# Patient Record
Sex: Male | Born: 1935 | Race: White | Hispanic: No | Marital: Married | Smoking: Former smoker
Health system: Southern US, Community
[De-identification: ages and names within clinical notes are randomized; demographics above are authoritative.]

## PROBLEM LIST (undated history)

## (undated) DIAGNOSIS — G4730 Sleep apnea, unspecified: Secondary | ICD-10-CM

## (undated) DIAGNOSIS — M109 Gout, unspecified: Secondary | ICD-10-CM

## (undated) DIAGNOSIS — I10 Essential (primary) hypertension: Secondary | ICD-10-CM

## (undated) DIAGNOSIS — E669 Obesity, unspecified: Secondary | ICD-10-CM

## (undated) DIAGNOSIS — K2270 Barrett's esophagus without dysplasia: Secondary | ICD-10-CM

## (undated) DIAGNOSIS — I2699 Other pulmonary embolism without acute cor pulmonale: Secondary | ICD-10-CM

## (undated) HISTORY — DX: Gout, unspecified: M10.9

## (undated) HISTORY — DX: Essential (primary) hypertension: I10

## (undated) HISTORY — PX: EYE SURGERY: SHX253

## (undated) HISTORY — DX: Barrett's esophagus without dysplasia: K22.70

## (undated) HISTORY — DX: Sleep apnea, unspecified: G47.30

---
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
## 2003-08-25 HISTORY — PX: HERNIA REPAIR: SHX51

## 2017-04-14 ENCOUNTER — Encounter: Payer: Self-pay | Admitting: *Deleted

## 2017-04-15 ENCOUNTER — Encounter: Payer: Self-pay | Admitting: Nurse Practitioner

## 2017-04-15 ENCOUNTER — Ambulatory Visit: Payer: Medicare Other | Admitting: Nurse Practitioner

## 2017-04-15 DIAGNOSIS — K432 Incisional hernia without obstruction or gangrene: Secondary | ICD-10-CM

## 2017-04-15 DIAGNOSIS — G4730 Sleep apnea, unspecified: Secondary | ICD-10-CM | POA: Diagnosis not present

## 2017-04-15 DIAGNOSIS — K2270 Barrett's esophagus without dysplasia: Secondary | ICD-10-CM | POA: Insufficient documentation

## 2017-04-15 DIAGNOSIS — R350 Frequency of micturition: Secondary | ICD-10-CM | POA: Diagnosis not present

## 2017-04-15 DIAGNOSIS — M1A0790 Idiopathic chronic gout, unspecified ankle and foot, without tophus (tophi): Secondary | ICD-10-CM | POA: Diagnosis not present

## 2017-04-15 DIAGNOSIS — M109 Gout, unspecified: Secondary | ICD-10-CM | POA: Insufficient documentation

## 2017-04-15 DIAGNOSIS — G43109 Migraine with aura, not intractable, without status migrainosus: Secondary | ICD-10-CM

## 2017-04-15 DIAGNOSIS — K22719 Barrett's esophagus with dysplasia, unspecified: Secondary | ICD-10-CM | POA: Diagnosis not present

## 2017-04-15 DIAGNOSIS — R609 Edema, unspecified: Secondary | ICD-10-CM

## 2017-04-15 DIAGNOSIS — I10 Essential (primary) hypertension: Secondary | ICD-10-CM | POA: Diagnosis not present

## 2017-04-15 NOTE — Assessment & Plan Note (Signed)
Ventral hernia, slightly left to the mid line of abdomen a half way from xyphoid process to umbilicus, size of a golf ball, reducible. Likely incisional since he is s/p GERD correction surgery and umbilical hernia repair. He stated he underwent a evaluation of surgeon. It does get bigger over the years. Continue to observe.

## 2017-04-15 NOTE — Assessment & Plan Note (Addendum)
Underwent GI evaluation and esophagoscopy, taking Omeprazole 20mg  daily, avoid food high in acidity, ice cream, or eating too late. His father died of esophageal cancer. Will need f/u GI in future. Last colonoscopy was 2018. Update CBC

## 2017-04-15 NOTE — Assessment & Plan Note (Signed)
First onset at age of 36-15, better over years, has visual aura, usually lasted about 20-30 minutes, taking Sumatriptan 50mg  qd

## 2017-04-15 NOTE — Assessment & Plan Note (Addendum)
Underwent sleep study, needs BiPAP, will assist the patient to obtain equipment.

## 2017-04-15 NOTE — Assessment & Plan Note (Signed)
Cannot remember right or left ankle, last gout attack was at least 20 year ago, taking Allopurinol 100mg  bid, will update Uric acid and CMP

## 2017-04-15 NOTE — Patient Instructions (Signed)
CBC CMP TSH Uric acid lipid panel prior to the next appointment. Next appointment: 6 months with Dr. Bubba Camp. Referral to Urology as a new patient.

## 2017-04-15 NOTE — Assessment & Plan Note (Addendum)
Blood pressure is controlled, taking Verapamil, the patient reported he has a large heart, not sure cardiomegaly vs cardiomyopathy, pending medical record transfer. Update TSH, lipid panel.

## 2017-04-15 NOTE — Assessment & Plan Note (Signed)
2-3x/night, he stated his PSA was okay in the past per his PCP in [facility]. Will refer to Urology for further evaluation.

## 2017-04-15 NOTE — Assessment & Plan Note (Signed)
Trace edema noted in ankles and feet, denied cough, SOB, nocturnal orthopnea. Hx of cardiomegaly, underwent cardiology evaluation, he was told his heart function is at minimal of normal function. Verapamil may be contributory. Observe.

## 2017-04-15 NOTE — Progress Notes (Addendum)
Provider:  Marlana Latus NP Location:   clinic [place]   Place of Service:   Clinic   PCP: Avory Rahimi X, NP Patient Care Team: Janine Reller X, NP as PCP - General (Internal Medicine)  Extended Emergency Contact Information Primary Emergency Contact: Dubie,Betty Address: [address], [place] [address] [place] Phone: [phone] Mobile Phone: [phone] Relation: Spouse  Code Status: DNR Goals of Care: Advanced Directive information Advanced Directives 04/15/2017  Does Patient Have a Medical Advance Directive? Yes  Type of Paramedic of [place];Living will  Does patient want to make changes to medical advance directive? No - Patient declined  Copy of [place] in Chart? Yes      Chief Complaint  Patient presents with  . Establish Care    HPI: Patient is a 82 y.o. male seen today for admission to [facility] @ [place].   The patient has history of GERD, s/p surgical correction, HTN, enlarged hart, gout, barrett's esophagitis, sleep apnea, migraine headache.[place] [facility]. His recent cognitive test BIM 15/15. He has a concern of urination 2-3x/night and some swelling in ankles/feet. He denied SOB, cough, palpitation, dysuria, or urinary urgency.  Past Medical History:  Diagnosis Date  . Apnea, sleep   . Barrett's esophagus   . Gout   . Hypertension    Past Surgical History:  Procedure Laterality Date  . EYE SURGERY Bilateral 2011-2012   catracts removed  . HERNIA REPAIR  2005   T. Carlton Adam MD  . TONSILECTOMY/ADENOIDECTOMY WITH MYRINGOTOMY  1944    reports that he has quit smoking. He has a 15.00 pack-year smoking history. He has never used smokeless tobacco. He reports that he drinks about 1.8 - 2.4 oz of alcohol per week . He reports that he does not use drugs. Social History   Social History  . Marital status: Married    Spouse name: Inez Catalina  . Number  of children: 1  . Years of education: N/A   Occupational History  . Not on file.   Social History Main Topics  . Smoking status: Former Smoker    Packs/day: 1.00    Years: 15.00  . Smokeless tobacco: Never Used  . Alcohol use 1.8 - 2.4 oz/week    3 - 4 Standard drinks or equivalent per week  . Drug use: No  . Sexual activity: Not on file   Other Topics Concern  . Not on file   Social History Narrative   Social History     Marital status: Married ,1986            Spouse name: Inez Catalina                    Years of education:                 Number of children: 1                Occupational History: Teacher     None on file      Social History Main Topics     Smoking status:                       Smokeless tobacco: Not on file  Alcohol use: 3-4 drinks per week     Drug use: Unknown     Sexual activity: Not on file            Other Topics            Concern     None on file      Social History Narrative     None on file                Functional Status Survey:    Family History  Problem Relation Age of Onset  . Osteoporosis Mother   . Cancer Father 32       esophagus  . Crohn's disease Sister   . Birth defects Maternal Grandmother        colon  . Diabetes Paternal Grandmother     Health Maintenance  Topic Date Due  . PNA vac Low Risk Adult (2 of 2 - PPSV23) 09/13/2015  . INFLUENZA VACCINE  03/24/2017  . TETANUS/TDAP  07/31/2026    Allergies  Allergen Reactions  . Penicillins     Allergies as of 04/15/2017      Reactions   Penicillins       Medication List       Accurate as of 04/15/17 11:59 PM. Always use your most recent med list.          allopurinol 100 MG tablet Commonly known as:  ZYLOPRIM Take 100 mg by mouth 2 (two) times daily.   fluticasone 50 MCG/ACT nasal spray Commonly known as:  FLONASE Place into both nostrils daily.   multivitamin with minerals tablet Take 1 tablet by mouth daily.   omeprazole 20  MG capsule Commonly known as:  PRILOSEC Take 20 mg by mouth daily.   SUMAtriptan 50 MG tablet Commonly known as:  IMITREX Take 50 mg by mouth as needed for migraine. May repeat in 2 hours if headache persists or recurs.   verapamil 180 MG CR tablet Commonly known as:  CALAN-SR Take 180 mg by mouth at bedtime.            Discharge Care Instructions        Start     Ordered   04/16/17 0000  CBC (no diff)     04/16/17 0956   04/16/17 0000  CMP and Liver     04/16/17 0956   04/16/17 0000  Uric Acid     04/16/17 0956   04/16/17 0000  Lipid panel     04/16/17 0956   04/16/17 0000  TSH     04/16/17 0956      Review of Systems  Constitutional: Negative for activity change, appetite change, chills, diaphoresis, fatigue, fever and unexpected weight change.       Overweight  HENT: Positive for hearing loss. Negative for congestion, dental problem, rhinorrhea, sore throat, tinnitus, trouble swallowing and voice change.   Eyes: Negative for pain, redness and itching.       S/p cataracts removal and lens implants R+L  Respiratory: Negative for cough, choking, chest tightness and shortness of breath.        Sleep apnea  Cardiovascular: Positive for leg swelling. Negative for chest pain and palpitations.  Gastrointestinal: Negative for abdominal distention, abdominal pain, constipation, diarrhea, nausea and vomiting.       Heart burns with certain food or eating too close to bedtime  Endocrine: Negative for cold intolerance and heat intolerance.  Genitourinary: Positive for frequency. Negative for difficulty urinating, dysuria and  urgency.       2-3x/night. Circumcised  Musculoskeletal: Negative for arthralgias, back pain, gait problem, joint swelling, myalgias, neck pain and neck stiffness.  Skin: Negative for pallor, rash and wound.  Neurological: Positive for headaches. Negative for dizziness, tremors, seizures, speech difficulty and numbness.  Hematological: Negative for  adenopathy.  Psychiatric/Behavioral: Negative for agitation, behavioral problems, confusion, decreased concentration, hallucinations and sleep disturbance. The patient is not nervous/anxious.        BIM 15/15    Vitals:   04/15/17 1426  BP: 118/60  Pulse: (!) 58  Resp: 20  Temp: (!) 97.4 F (36.3 C)  SpO2: 96%  Weight: 242 lb (109.8 kg)  Height: 5\' 11"  (1.803 m)   Body mass index is 33.75 kg/m. Physical Exam  Constitutional: He is oriented to person, place, and time. He appears well-developed and well-nourished. No distress.  HENT:  Head: Normocephalic and atraumatic.  Right Ear: External ear normal.  Nose: Nose normal.  Mouth/Throat: Oropharynx is clear and moist. No oropharyngeal exudate.  Eyes: Pupils are equal, round, and reactive to light. Conjunctivae and EOM are normal. Right eye exhibits no discharge. Left eye exhibits no discharge. No scleral icterus.  Neck: Normal range of motion. Neck supple.  Cardiovascular: Normal rate, regular rhythm, normal heart sounds and intact distal pulses.   No murmur heard. Pulmonary/Chest: Effort normal and breath sounds normal. He has no rales.  Abdominal: Soft. Bowel sounds are normal. He exhibits no distension. There is no tenderness. There is no rebound and no guarding.  Ventral hernia, slightly left to the mid line of abdomen a half way from xyphoid process to umbilicus, size of a golf ball, reducible  Musculoskeletal: Normal range of motion. He exhibits edema.  Trace edema ankles and feet.   Lymphadenopathy:    He has no cervical adenopathy.  Neurological: He is alert and oriented to person, place, and time. No cranial nerve deficit. He exhibits normal muscle tone. Coordination normal.  Skin: Skin is warm and dry. No rash noted. He is not diaphoretic. No erythema. No pallor.  Superficial varicose veins R+L lower legs. Mid abd surgical scar from GERD corrective surgery   Psychiatric: He has a normal mood and affect. His behavior is  normal. Judgment and thought content normal.    Labs reviewed: Basic Metabolic Panel: No results for input(s): NA, K, CL, CO2, GLUCOSE, BUN, CREATININE, CALCIUM, MG, PHOS in the last 8760 hours. Liver Function Tests: No results for input(s): AST, ALT, ALKPHOS, BILITOT, PROT, ALBUMIN in the last 8760 hours. No results for input(s): LIPASE, AMYLASE in the last 8760 hours. No results for input(s): AMMONIA in the last 8760 hours. CBC: No results for input(s): WBC, NEUTROABS, HGB, HCT, MCV, PLT in the last 8760 hours. Cardiac Enzymes: No results for input(s): CKTOTAL, CKMB, CKMBINDEX, TROPONINI in the last 8760 hours. BNP: Invalid input(s): POCBNP No results found for: HGBA1C No results found for: TSH No results found for: VITAMINB12 No results found for: FOLATE No results found for: IRON, TIBC, FERRITIN  Imaging and Procedures obtained prior to SNF admission: Patient was never admitted.  Assessment/Plan  Ventral hernia, recurrent Ventral hernia, slightly left to the mid line of abdomen a half way from xyphoid process to umbilicus, size of a golf ball, reducible. Likely incisional since he is s/p GERD correction surgery and umbilical hernia repair. He stated he underwent a evaluation of surgeon. It does get bigger over the years. Continue to observe.   Edema Trace edema noted in ankles  and feet, denied cough, SOB, nocturnal orthopnea. Hx of cardiomegaly, underwent cardiology evaluation, he was told his heart function is at minimal of normal function. Verapamil may be contributory. Observe.  Gout of ankle Cannot remember right or left ankle, last gout attack was at least 20 year ago, taking Allopurinol 100mg  bid, will update Uric acid and CMP   Hypertension Blood pressure is controlled, taking Verapamil, the patient reported he has a large heart, not sure cardiomegaly vs cardiomyopathy, pending medical record transfer. Update TSH, lipid panel.   Barrett's esophagus Underwent GI  evaluation and esophagoscopy, taking Omeprazole 20mg  daily, avoid food high in acidity, ice cream, or eating too late. His father died of esophageal cancer. Will need f/u GI in future. Last colonoscopy was 2018. Update CBC  Migraine headache with aura First onset at age of 89-15, better over years, has visual aura, usually lasted about 20-30 minutes, taking Sumatriptan 50mg  qd   Urinary frequency 2-3x/night, he stated his PSA was okay in the past per his PCP in [facility]. Will refer to Urology for further evaluation.   Sleep apnea in adult Underwent sleep study, needs BiPAP, will assist the patient to obtain equipment.   Family/ staff Communication: plan of care reviewed with the patient, needs Urology evaluation, f/u GI for Barrett esophagitis  Labs/tests ordered: CBC CMP TSH Uric acid lipid panel prior to the next appointment  Next appointment: 6 months with Dr. Bubba Camp

## 2017-04-21 ENCOUNTER — Telehealth: Payer: Self-pay | Admitting: *Deleted

## 2017-04-21 NOTE — Telephone Encounter (Signed)
Spoke with patient regarding his BiPap machine, I informed him that I had spoken with Bethanne Ginger  And she stated that it would probably be at the beginning of next week due to the holiday on Monday before they will get out.to see him.

## 2017-05-05 ENCOUNTER — Telehealth: Payer: Self-pay | Admitting: *Deleted

## 2017-05-05 NOTE — Telephone Encounter (Signed)
Patient would like ManXie to refer him to a Sleep Specialist. Has some concerns with his CPAP. Would like for [facility] Medical Assistant to call him. Would like to see [facility]. Please Call [phone]

## 2017-05-05 NOTE — Telephone Encounter (Signed)
Spoke with Mr. Eric Chung regarding a referral to a sleep specialist , preferably Dr. Danielle Rankin @ [place]. Mr. Eric Chung stated that he has tubes running from his tear ducts in the corners of his eyes and he is having problems with his BiPap machine forcing air into these tubes and comes out of his eyes. He is requesting an appointment because he is unable to sleep due to this issue. I left an message on the phone [name] to return my call on the clinic phone [phone]).

## 2017-05-12 ENCOUNTER — Telehealth: Payer: Self-pay | Admitting: *Deleted

## 2017-05-12 ENCOUNTER — Other Ambulatory Visit: Payer: Self-pay | Admitting: *Deleted

## 2017-05-12 DIAGNOSIS — G4730 Sleep apnea, unspecified: Secondary | ICD-10-CM

## 2017-05-12 NOTE — Telephone Encounter (Signed)
Called resident regarding his sleep study , wanted to inform him that I spoke with Ms [name] about his referral for his study and if he could call her so she can get him scheduled.

## 2017-05-18 ENCOUNTER — Non-Acute Institutional Stay: Payer: Medicare Other | Admitting: Internal Medicine

## 2017-05-18 ENCOUNTER — Encounter: Payer: Self-pay | Admitting: Internal Medicine

## 2017-05-18 VITALS — BP 116/64 | HR 66 | Temp 98.3°F | Resp 18 | Ht 71.0 in | Wt 240.4 lb

## 2017-05-18 DIAGNOSIS — H04121 Dry eye syndrome of right lacrimal gland: Secondary | ICD-10-CM | POA: Diagnosis not present

## 2017-05-18 DIAGNOSIS — G4730 Sleep apnea, unspecified: Secondary | ICD-10-CM | POA: Diagnosis not present

## 2017-05-18 DIAGNOSIS — E669 Obesity, unspecified: Secondary | ICD-10-CM

## 2017-05-18 NOTE — Progress Notes (Signed)
[facility]  Provider: Blanchie Serve MD   Location:  [place] of Service:  Clinic (12)  PCP: Mast, Man X, NP Patient Care Team: Mast, Man X, NP as PCP - General (Internal Medicine)  Extended Emergency Contact Information Primary Emergency Contact: Stiner,Betty Address: [address], [address], [place] [address] [place] Phone: [phone] Mobile Phone: [phone] Relation: Spouse   Goals of Care: Advanced Directive information Advanced Directives 04/15/2017  Does Patient Have a Medical Advance Directive? Yes  Type of Paramedic of [place];Living will  Does patient want to make changes to medical advance directive? No - Patient declined  Copy of [place] in Chart? Yes     Chief Complaint  Patient presents with  . Acute Visit    The bipap machine is causing the air to go up into eyes and its drying them out. Patient stated that he had tubes in both eyes. Its only the right eye thats causing issus.   . Medication Refill    No refills needed at this time. Patient stated that he's having issues sleeping and would like to try a sleep aid.  . Other    Patient stated that the bipap machine is on a high setting and wants to know can it be adjusted. He stated that he has an appointment on October 16 with the sleep study people.    HPI: Patient is a 82 y.o. Chung seen today for acute visit. He has been diagnosed with severe sleep apnea June 2018. He now has a BiPAP machine for 2 weeks. He has been having trouble with his right eye since using the machine. He feels air blowing from inside his nose to medial side of his right eye and this is making his eye dry and also interrupting with his sleep. He has some itching and crusting to right eye in am and as day progresses, these symptoms have resolved. He has history of lacrimal duct blockage and had a plastic duct  placed about 15 years back.   Past Medical History:  Diagnosis Date  . Apnea, sleep   . Barrett's esophagus   . Gout   . Hypertension    Past Surgical History:  Procedure Laterality Date  . EYE SURGERY Bilateral 2011-2012   catracts removed  . HERNIA REPAIR  2005   T. Carlton Adam MD  . TONSILECTOMY/ADENOIDECTOMY WITH MYRINGOTOMY  1944    reports that he has quit smoking. He has a 15.00 pack-year smoking history. He has never used smokeless tobacco. He reports that he drinks about 1.8 - 2.4 oz of alcohol per week . He reports that he does not use drugs. Social History   Social History  . Marital status: Married    Spouse name: Inez Catalina  . Number of children: 1  . Years of education: N/A   Occupational History  . Not on file.   Social History Main Topics  . Smoking status: Former Smoker    Packs/day: 1.00    Years: 15.00  . Smokeless tobacco: Never Used  . Alcohol use 1.8 - 2.4 oz/week    3 - 4 Standard drinks or equivalent per week  . Drug use: No  . Sexual activity: Not on file   Other Topics Concern  . Not on file   Social History Narrative   Social History  Marital status: Married ,1986            Spouse name: Inez Catalina                    Years of education:                 Number of children: 1                Occupational History: Pharmacist, hospital     None on file      Social History Main Topics     Smoking status:                       Smokeless tobacco: Not on file                        Alcohol use: 3-4 drinks per week     Drug use: Unknown     Sexual activity: Not on file            Other Topics            Concern     None on file      Social History Narrative     None on file                 Family History  Problem Relation Age of Onset  . Osteoporosis Mother   . Cancer Father 20       esophagus  . Crohn's disease Sister   . Birth defects Maternal Grandmother        colon  . Diabetes Paternal Grandmother     Health Maintenance  Topic Date Due  .  PNA vac Low Risk Adult (2 of 2 - PPSV23) 09/13/2015  . INFLUENZA VACCINE  03/24/2017  . TETANUS/TDAP  07/31/2026    Allergies  Allergen Reactions  . Penicillins     Outpatient Encounter Prescriptions as of 05/18/2017  Medication Sig  . allopurinol (ZYLOPRIM) 100 MG tablet Take 100 mg by mouth 2 (two) times daily.  . fluticasone (FLONASE) 50 MCG/ACT nasal spray Place 1 spray into both nostrils daily.   . Multiple Vitamins-Minerals (MULTIVITAMIN WITH MINERALS) tablet Take 1 tablet by mouth daily.  Marland Kitchen omeprazole (PRILOSEC) 20 MG capsule Take 20 mg by mouth daily.  . SUMAtriptan (IMITREX) 50 MG tablet Take 50 mg by mouth as needed for migraine. May repeat in 2 hours if headache persists or recurs.  . verapamil (CALAN-SR) 180 MG CR tablet Take 180 mg by mouth at bedtime.   No facility-administered encounter medications on file as of 05/18/2017.     Review of Systems  HENT: Negative for congestion, ear pain, mouth sores, postnasal drip, rhinorrhea, sinus pain, sinus pressure, sore throat and trouble swallowing.   Respiratory: Negative for cough and shortness of breath.   Cardiovascular: Negative for chest pain.  Neurological: Negative for light-headedness and headaches.    Vitals:   05/18/17 1052  BP: 116/64  Pulse: 66  Resp: 18  Temp: 98.3 F (36.8 C)  TempSrc: Oral  SpO2: 93%  Weight: 240 lb 6.4 oz (109 kg)  Height: 5\' 11"  (1.803 m)   Body mass index is 33.53 kg/m. Physical Exam  Constitutional: He is oriented to person, place, and time.  Obese elderly Chung in no distress.   HENT:  Head: Normocephalic and atraumatic.  Mouth/Throat: Oropharynx is clear and moist.  Eyes: Pupils are equal, round, and reactive to  light. Conjunctivae and EOM are normal. Right eye exhibits no discharge. Left eye exhibits no discharge.  Neck: Neck supple.  Cardiovascular: Normal rate and regular rhythm.   Pulmonary/Chest: Effort normal.  Decreased air entry to lung bases with rales.     Abdominal: Soft. Bowel sounds are normal.  Musculoskeletal: Normal range of motion.  Neurological: He is alert and oriented to person, place, and time.  Skin: Skin is warm and dry.  Psychiatric: He has a normal mood and affect.    Labs reviewed: Basic Metabolic Panel: No results for input(s): NA, K, CL, CO2, GLUCOSE, BUN, CREATININE, CALCIUM, MG, PHOS in the last 8760 hours. Liver Function Tests: No results for input(s): AST, ALT, ALKPHOS, BILITOT, PROT, ALBUMIN in the last 8760 hours. No results for input(s): LIPASE, AMYLASE in the last 8760 hours. No results for input(s): AMMONIA in the last 8760 hours. CBC: No results for input(s): WBC, NEUTROABS, HGB, HCT, MCV, PLT in the last 8760 hours. Cardiac Enzymes: No results for input(s): CKTOTAL, CKMB, CKMBINDEX, TROPONINI in the last 8760 hours. BNP: Invalid input(s): POCBNP No results found for: HGBA1C No results found for: TSH No results found for: VITAMINB12 No results found for: FOLATE No results found for: IRON, TIBC, FERRITIN  Lipid Panel: No results for input(s): CHOL, HDL, LDLCALC, TRIG, CHOLHDL, LDLDIRECT in the last 8760 hours. No results found for: HGBA1C  Procedures since last visit: No results found.  Assessment/Plan   1. Sleep apnea, unspecified type Currently on BiPAP. Will need to be seen by sleep study team to check on his mask fitting and need for change of setting. patient does not want to use it until further evaluation in sleep clinic. Encouraged him to use the machine with supportive therapy to right eye as below. Patient agrees.   2. Obesity (BMI 30-39.9) Exercise and diet counselling provided. Advised to use his biPAP to help him rest better through night and make him feel rested in am so he has energy to stay active and possibly exercise. Weight loss encouraged.   3. Dry eye of right side With air blowing to his right eye- this could be from leakage from his mask or from problem of his lacrimal duct.  Advised artifical tear drops to keep eye moist and to moisten clean cloth piece and apply it to his right eye before going to bed at night. Advised to see ophthalmology for further assessment. Also advised to notify if has pain to right eye or change of vision. Pt agrees.     Labs/tests ordered:  none  Next appointment: has f/u in 2/18 for RV  Communication: reviewed care plan with patient   Blanchie Serve, MD Internal Medicine [place], [place] [address] Cell Phone (Monday-Friday 8 am - 5 pm): [phone] On Call: [phone] and follow prompts after 5 pm and on weekends Office Phone: [phone] Office Fax: [phone]

## 2017-05-20 ENCOUNTER — Telehealth: Payer: Self-pay | Admitting: Nurse Practitioner

## 2017-05-20 NOTE — Telephone Encounter (Signed)
Left message asking patient to call and schedule AWV-S with nurse at [facility] clinic on Thursday, 05/27/17. VDM (DD)

## 2017-05-24 ENCOUNTER — Telehealth: Payer: Self-pay

## 2017-05-24 ENCOUNTER — Ambulatory Visit (INDEPENDENT_AMBULATORY_CARE_PROVIDER_SITE_OTHER): Payer: Medicare Other | Admitting: Neurology

## 2017-05-24 ENCOUNTER — Encounter: Payer: Self-pay | Admitting: Neurology

## 2017-05-24 VITALS — BP 136/82 | HR 74 | Ht 71.0 in | Wt 242.0 lb

## 2017-05-24 DIAGNOSIS — G4733 Obstructive sleep apnea (adult) (pediatric): Secondary | ICD-10-CM | POA: Diagnosis not present

## 2017-05-24 DIAGNOSIS — Z789 Other specified health status: Secondary | ICD-10-CM

## 2017-05-24 NOTE — Progress Notes (Signed)
Subjective:    Patient ID: Eric Chung is a 82 y.o. male.  HPI     Star Age, MD, PhD [facility] [address] [place], [place] [address]  Dear Man,   I saw your patient, Eric Chung, upon your kind request in my neurologic clinic today for initial consultation of his sleep disorder, in particular reevaluation of his OSA. The patient is unaccompanied today. As you know, Eric Chung is an 82 year old right-handed gentleman with an underlying medical history of gout, reflux disease with Barrett's esophagus, hypertension, and obesity, who was recently diagnosed with obstructive sleep apnea and placed on BiPAP ST. Prior sleep study results were reviewed today. He had a split-night sleep study on 02/18/2017 at a sleep lab facility in [place]. Baseline sleep study results indicated an AHI of 61.2 per hour. He had no significant PLMS. CPAP was titrated from 5 cm to 8 cm and he was switched to BiPAP of 19/15 due to central events noted. He was placed on a backup rate due to central apneas and hypopneas persisting. Optimal pressure was determined to be 17-19 cm over 15-16 cm. He was fitted with a air touch fullface mask. I reviewed your office note from 04/15/2017. A BiPAP download was reviewed today, 05/03/2017 through 05/23/2017, which is a total of 21 days, during which time he used his BiPAP every night with percent used days greater than 4 hours at 86%, indicating very good compliance with an average usage of 5 hours and 9 minutes, residual AHI 8.6 per hour, leak very high with the 95th percentile at 100 L/m on a pressure of 18/14 with a rate of 12. He reports difficulty tolerating the treatment. He has a history of eye problems. He has been seeing an eye doctor, he has been seeing a specialist for dry eyes, he has been given the lubricating ointment to put in his eyes and also was advised to start using goggles at night to prevent the BiPAP air to  go into his right eye. He is using a simplus fullface mask size medium. He has 2 other kinds of masks at the house. He needs to use a fullface mask but also has a full beard. He feels like the mask rides up and he has leakage from the mouth. He has increased his ramp time to 45 minutes to adjust better. He has been on treatment for 3 weeks and is not necessarily feeling any better yet. His wife reports that his snoring is much better and the machine is quiet enough for her to be able to sleep. They moved from [place] to friend's home independent living. He is a retired Network engineer. He quit smoking in 1987, drinks alcohol 2-3 times per week, drinks tea typically in the morning, typically no coffee or sodas. Of note, he has gained weight since his move in the past 6 months, in the realm of 20 pounds he estimates. He denies restless leg symptoms or morning headaches. Epworth sleepiness score is 9 out of 24, fatigue score is 41 out of 63. He goes to bed around 10:30 to 11 PM, WT around 7:30.  Prior to his attendant split-night sleep study he had a home sleep test on 01/05/2017 which indicated an AHI of about 46 per hour. He has some difficulty falling asleep and staying asleep, nocturia about 1-2 per night.   His Past Medical History Is Significant For: Past Medical History:  Diagnosis Date  . Apnea, sleep   .  Barrett's esophagus   . Gout   . Hypertension     His Past Surgical History Is Significant For: Past Surgical History:  Procedure Laterality Date  . EYE SURGERY Bilateral 2011-2012   catracts removed  . HERNIA REPAIR  2005   T. Carlton Adam MD  . TONSILECTOMY/ADENOIDECTOMY WITH MYRINGOTOMY  1944    His Family History Is Significant For: Family History  Problem Relation Age of Onset  . Osteoporosis Mother   . Cancer Father 46       esophagus  . Crohn's disease Sister   . Birth defects Maternal Grandmother        colon  . Diabetes Paternal Grandmother     His Social History Is Significant  For: Social History   Social History  . Marital status: Married    Spouse name: Inez Catalina  . Number of children: 1  . Years of education: N/A   Social History Main Topics  . Smoking status: Former Smoker    Packs/day: 1.00    Years: 15.00  . Smokeless tobacco: Never Used  . Alcohol use 1.8 - 2.4 oz/week    3 - 4 Standard drinks or equivalent per week  . Drug use: No  . Sexual activity: Not Asked   Other Topics Concern  . None   Social History Narrative   Social History     Marital status: Married ,1986            Spouse name: Inez Catalina                    Years of education:                 Number of children: 1                Occupational History: Pharmacist, hospital     None on file      Social History Main Topics     Smoking status:                       Smokeless tobacco: Not on file                        Alcohol use: 3-4 drinks per week     Drug use: Unknown     Sexual activity: Not on file            Other Topics            Concern     None on file      Social History Narrative     None on file                His Allergies Are:  Allergies  Allergen Reactions  . Penicillins   :   His Current Medications Are:  Outpatient Encounter Prescriptions as of 80/08/2016  Medication Sig  . allopurinol (ZYLOPRIM) 100 MG tablet Take 100 mg by mouth 2 (two) times daily.  . fluticasone (FLONASE) 50 MCG/ACT nasal spray Place 1 spray into both nostrils daily.   . Multiple Vitamins-Minerals (MULTIVITAMIN WITH MINERALS) tablet Take 1 tablet by mouth daily.  Marland Kitchen omeprazole (PRILOSEC) 20 MG capsule Take 20 mg by mouth daily.  . SUMAtriptan (IMITREX) 50 MG tablet Take 50 mg by mouth as needed for migraine. May repeat in 2 hours if headache persists or recurs.  . verapamil (CALAN-SR) 180 MG CR tablet Take 180 mg by mouth at bedtime.  No facility-administered encounter medications on file as of 80/08/2016.   :  Review of Systems:  Out of a complete 14 point review of systems, all are  reviewed and negative with the exception of these symptoms as listed below:  Review of Systems  Neurological:       Pt presents today to discuss his bipap. Pt was diagnosed with sleep apnea in [place]. Pt moved up here before he could receive treatment and [facility] has now set him up on bipap. Pt has had trouble adjusting to bipap and has only been on the bipap for 21 days.  Epworth Sleepiness Scale 0= would never doze 1= slight chance of dozing 2= moderate chance of dozing 3= high chance of dozing  Sitting and reading: 2 Watching TV: 1 Sitting inactive in a public place (ex. Theater or meeting): 0 As a passenger in a car for an hour without a break: 1 Lying down to rest in the afternoon: 3 Sitting and talking to someone: 0 Sitting quietly after lunch (no alcohol): 2 In a car, while stopped in traffic: 0 Total: 9     Objective:  Neurological Exam  Physical Exam Physical Examination:   Vitals:   05/24/17 1543  BP: 136/82  Pulse: 74   General Examination: The patient is a very pleasant 82 y.o. male in no acute distress. He appears well-developed and well-nourished and well groomed. Full bears, trimmed shorter.   HEENT: Normocephalic, atraumatic, pupils are equal, round and reactive to light and accommodation. Funduscopic exam is normal with sharp disc margins noted. Extraocular tracking is good without limitation to gaze excursion or nystagmus noted. Normal smooth pursuit is noted. Hearing is grossly intact. Tympanic membranes are clear bilaterally. Face is symmetric with normal facial animation and normal facial sensation. Speech is clear with no dysarthria noted. There is no hypophonia. There is no lip, neck/head, jaw or voice tremor. Neck is supple with full range of passive and active motion. There are no carotid bruits on auscultation. Oropharynx exam reveals: mild mouth dryness, adequate dental hygiene and moderate airway crowding, due to Redundant soft palate and large uvula.  Tonsils are absent. Mallampati is class II. Neck circumference is 17-1/4 inches.  Chest: Clear to auscultation without wheezing, rhonchi or crackles noted.  Heart: S1+S2+0, regular and normal without murmurs, rubs or gallops noted.   Abdomen: Soft, non-tender and non-distended with normal bowel sounds appreciated on auscultation.  Extremities: There is trace pitting edema in the distal lower extremities bilaterally. Pedal pulses are intact.  Skin: Warm and dry without trophic changes noted.  Musculoskeletal: exam reveals no obvious joint deformities, tenderness or joint swelling or erythema.   Neurologically:  Mental status: The patient is awake, alert and oriented in all 4 spheres. His immediate and remote memory, attention, language skills and fund of knowledge are appropriate. There is no evidence of aphasia, agnosia, apraxia or anomia. Speech is clear with normal prosody and enunciation. Thought process is linear. Mood is normal and affect is normal.  Cranial nerves II - XII are as described above under HEENT exam. In addition: shoulder shrug is normal with equal shoulder height noted. Motor exam: Normal bulk, strength and tone is noted. There is no drift, tremor or rebound. Fine motor skills and coordination: grossly intact.  Cerebellar testing: No dysmetria or intention tremor. Heel to shin is unremarkable bilaterally. There is no truncal or gait ataxia.  Sensory exam: intact to light touch in the upper and lower extremities.  Gait, station and balance:  He stands easily. No veering to one side is noted. No leaning to one side is noted. Posture is age-appropriate and stance is narrow based. Gait shows normal stride length and normal pace. No problems turning are noted.  Assessment and Plan:  In summary, Eric Chung is a very pleasant 82 y.o.-year old male with an underlying medical history of gout, reflux disease with Barrett's esophagus, hypertension, and obesity, who presents for  neurologic consultation of his sleep disorder, recent diagnosis of severe obstructive sleep apnea with a split-night sleep study done in [place] on 02/18/2017. He was titrated on CPAP and then on BiPAP ST. He is now on BiPAP ST, fully compliant with treatment but has not had any telltale improvement in his sleep, is struggling with high leak and also with dry eyes, sleep interruption, and does not feel fully rested. He is commended for his treatment adherence. He is encouraged to continue with it. I suggested he try melatonin at night for sleep, in the hope that it may help him consolidate his sleep a little better. We will also have our sleep lab manager, Shirlean Mylar, reach out to him to invite him for a daytime appointment for a mask refit and troubleshooting high leak. Ultimately, he may benefit from a titration study to optimize treatment settings and treatment level. His residual AHI is suboptimal around 8 per hour at this time and leak is rather high consistently, most likely secondary to air leaking from the mouth. I suggested a three-month recheck, sooner as needed. We will also have him have a mask refit appointment in the interim. I answered all his questions today and he was in agreement with the plan.  Thank you very much for allowing me to participate in the care of this nice patient. If I can be of any further assistance to you please do not hesitate to call me at [phone].  Sincerely,   Star Age, MD, PhD

## 2017-05-24 NOTE — Patient Instructions (Addendum)
Please continue using your BiPAP regularly. While your insurance requires that you use PAP at least 4 hours each night on 70% of the nights, I recommend, that you not skip any nights and use it throughout the night if you can. Getting used to PAP and staying with the treatment long term does take time and patience and discipline. Untreated obstructive sleep apnea when it is moderate to severe can have an adverse impact on cardiovascular health and raise her risk for heart disease, arrhythmias, hypertension, congestive heart failure, stroke and diabetes. Untreated obstructive sleep apnea causes sleep disruption, nonrestorative sleep, and sleep deprivation. This can have an impact on your day to day functioning and cause daytime sleepiness and impairment of cognitive function, memory loss, mood disturbance, and problems focussing. Using PAP regularly can improve these symptoms.  We will have you come back for a day time appointment in the sleep lab to work with Shirlean Mylar, our sleep lab manager for a mask fitting and to see if we can get you to tolerate your treatment.   Ultimately, we may have to bring you back for another sleep study to help optimize your treatment settings.   You can try Melatonin at night for sleep: take 1 mg to 3 mg, one to 2 hours before your bedtime. You can go up to 5 mg if needed. It is over the counter and comes in pill form, chewable form and spray, if you prefer.

## 2017-05-24 NOTE — Telephone Encounter (Signed)
Dr. Rexene Alberts is asking for this pt to meet with Robin to discuss a bipap mask refit. Pt uses [facility] as his DME. Pt knows that Shirlean Mylar will call him to schedule.

## 2017-05-26 NOTE — Telephone Encounter (Signed)
Called to remind pt of appt tomorrow. No answer and no voicemail option. VDM (DD)

## 2017-05-26 NOTE — Telephone Encounter (Signed)
Noted, thanks!

## 2017-05-26 NOTE — Telephone Encounter (Signed)
Patient came for mask fitting. He is using F&P full face mask medium. Mask is too small. When he opens his mouth it rises up and causes a leak. I fitted him with a large and he liked this much better. I gave him one to take home and try. I also fitted him with Res Med F10 large. If he needs to come back I will give him this one to try. I showed him how to adjust his humidity on his machine. He was having dryness in his mouth.

## 2017-05-27 ENCOUNTER — Non-Acute Institutional Stay: Payer: Medicare Other

## 2017-05-27 VITALS — BP 130/70 | HR 70 | Temp 98.2°F | Ht 71.0 in | Wt 242.0 lb

## 2017-05-27 DIAGNOSIS — Z0000 Encounter for general adult medical examination without abnormal findings: Secondary | ICD-10-CM

## 2017-05-27 MED ORDER — ZOSTER VAC RECOMB ADJUVANTED 50 MCG/0.5ML IM SUSR: 0.5000 mL | Freq: Once | INTRAMUSCULAR | 1 refills | Status: AC

## 2017-05-27 NOTE — Progress Notes (Signed)
Subjective:   Eric Chung is a 82 y.o. male who presents for Medicare Annual/Subsequent preventive examination at [facility]  Last AWV-03/2010       Objective:    Vitals: BP 130/70 (BP Location: Right Arm, Patient Position: Sitting)   Pulse 70   Temp 98.2 F (36.8 C) (Oral)   Ht 5\' 11"  (1.803 m)   Wt 242 lb (109.8 kg)   SpO2 93%   BMI 33.75 kg/m   Body mass index is 33.75 kg/m.  Tobacco History  Smoking Status  . Former Smoker  . Packs/day: 1.00  . Years: 15.00  Smokeless Tobacco  . Never Used     Counseling given: Not Answered   Past Medical History:  Diagnosis Date  . Apnea, sleep   . Barrett's esophagus   . Gout   . Hypertension    Past Surgical History:  Procedure Laterality Date  . EYE SURGERY Bilateral 2011-2012   catracts removed  . HERNIA REPAIR  2005   T. Carlton Adam MD  . TONSILECTOMY/ADENOIDECTOMY WITH MYRINGOTOMY  1944   Family History  Problem Relation Age of Onset  . Osteoporosis Mother   . Cancer Father 27       esophagus  . Crohn's disease Sister   . Birth defects Maternal Grandmother        colon  . Diabetes Paternal Grandmother    History  Sexual Activity  . Sexual activity: Not on file    Outpatient Encounter Prescriptions as of 05/27/2017  Medication Sig  . allopurinol (ZYLOPRIM) 100 MG tablet Take 100 mg by mouth 2 (two) times daily.  . fluticasone (FLONASE) 50 MCG/ACT nasal spray Place 1 spray into both nostrils daily.   . Melatonin 10 MG CAPS Take 1 tablet by mouth at bedtime.  . Multiple Vitamins-Minerals (MULTIVITAMIN WITH MINERALS) tablet Take 1 tablet by mouth daily.  Marland Kitchen omeprazole (PRILOSEC) 20 MG capsule Take 20 mg by mouth daily.  . SUMAtriptan (IMITREX) 50 MG tablet Take 50 mg by mouth as needed for migraine. May repeat in 2 hours if headache persists or recurs.  . verapamil (CALAN-SR) 180 MG CR tablet Take 180 mg by mouth at bedtime.  Marland Kitchen Zoster Vac Recomb Adjuvanted [facility])  injection Inject 0.5 mLs into the muscle once.   No facility-administered encounter medications on file as of 05/27/2017.     Activities of Daily Living In your present state of health, do you have any difficulty performing the following activities: 05/27/2017  Hearing? N  Vision? N  Difficulty concentrating or making decisions? Y  Comment memeory  Walking or climbing stairs? N  Dressing or bathing? N  Doing errands, shopping? N  Preparing Food and eating ? N  Using the Toilet? N  In the past six months, have you accidently leaked urine? N  Do you have problems with loss of bowel control? N  Managing your Medications? N  Managing your Finances? N  Housekeeping or managing your Housekeeping? N    Patient Care Team: Mast, Man X, NP as PCP - General (Internal Medicine)   Assessment:     Exercise Activities and Dietary recommendations Current Exercise Habits: The patient does not participate in regular exercise at present, Exercise limited by: None identified  Goals    . Exercise 150 minutes per week (moderate activity)          Patient will walk in the mornings 15-30 minutes      Fall Risk Fall Risk  05/27/2017  Falls in the past year? Yes  Number falls in past yr: 1  Injury with Fall? No   Depression Screen PHQ 2/9 Scores 05/27/2017  PHQ - 2 Score 0    Cognitive Function MMSE - Mini Mental State Exam 05/27/2017  Orientation to time 4  Orientation to Place 5  Registration 3  Attention/ Calculation 5  Recall 3  Language- name 2 objects 2  Language- repeat 1  Language- follow 3 step command 2  Language- read & follow direction 1  Write a sentence 1  Copy design 1  Total score 28        Immunization History  Administered Date(s) Administered  . Influenza-Unspecified 06/24/2016  . Pneumococcal Conjugate-13 09/12/2014  . Pneumococcal Polysaccharide-23 09/12/2014  . Td 07/31/2016  . Zoster Recombinat (Shingrix) 08/24/2005   Screening Tests Health  Maintenance  Topic Date Due  . PNA vac Low Risk Adult (2 of 2 - PPSV23) 09/13/2015  . INFLUENZA VACCINE  03/24/2017  . TETANUS/TDAP  07/31/2026      Plan:    I have personally reviewed and addressed the Medicare Annual Wellness questionnaire and have noted the following in the patient's chart:  A. Medical and social history B. Use of alcohol, tobacco or illicit drugs  C. Current medications and supplements D. Functional ability and status E.  Nutritional status F.  Physical activity G. Advance directives H. List of other physicians I.  Hospitalizations, surgeries, and ER visits in previous 12 months J.  [place] to include hearing, vision, cognitive, depression L. Referrals and appointments - none  In addition, I have reviewed and discussed with patient certain preventive protocols, quality metrics, and best practice recommendations. A written personalized care plan for preventive services as well as general preventive health recommendations were provided to patient.  See attached scanned questionnaire for additional information.   Signed,   Rich Reining, RN Nurse Health Advisor   Quick Notes   Health Maintenance: Patient will get flu vaccine when pharmacy gets it in stock. Shingles vaccine prescription sent to pharmacy     Abnormal Screen: MMSE 28/30. Passed clock drawing     Patient Concerns: None     Nurse Concerns: None

## 2017-05-27 NOTE — Patient Instructions (Signed)
Eric Chung , Thank you for taking time to come for your Medicare Wellness Visit. I appreciate your ongoing commitment to your health goals. Please review the following plan we discussed and let me know if I can assist you in the future.   Screening recommendations/referrals: Colonoscopy excluded you are over 75 Recommended yearly ophthalmology/optometry visit for glaucoma screening and checkup Recommended yearly dental visit for hygiene and checkup  Vaccinations: Influenza vaccine due, please get when facility gets it in stock Pneumococcal vaccine up to date Tdap vaccine up to date. Due 07/31/1986 Shingles vaccine due, I will sent prescription to your phamarcy  Advanced directives: In Chart  Conditions/risks identified: None  Next appointment: Dr. Bubba Camp 10/12/2017 @ 8:30am  Preventive Care 80 Years and Older, Male Preventive care refers to lifestyle choices and visits with your health care provider that can promote health and wellness. What does preventive care include?  A yearly physical exam. This is also called an annual well check.  Dental exams once or twice a year.  Routine eye exams. Ask your health care provider how often you should have your eyes checked.  Personal lifestyle choices, including:  Daily care of your teeth and gums.  Regular physical activity.  Eating a healthy diet.  Avoiding tobacco and drug use.  Limiting alcohol use.  Practicing safe sex.  Taking low doses of aspirin every day.  Taking vitamin and mineral supplements as recommended by your health care provider. What happens during an annual well check? The services and screenings done by your health care provider during your annual well check will depend on your age, overall health, lifestyle risk factors, and family history of disease. Counseling  Your health care provider may ask you questions about your:  Alcohol use.  Tobacco use.  Drug use.  Emotional well-being.  Home and  relationship well-being.  Sexual activity.  Eating habits.  History of falls.  Memory and ability to understand (cognition).  Work and work Statistician. Screening  You may have the following tests or measurements:  Height, weight, and BMI.  Blood pressure.  Lipid and cholesterol levels. These may be checked every 5 years, or more frequently if you are over 80 years old.  Skin check.  Lung cancer screening. You may have this screening every year starting at age 80 if you have a 30-pack-year history of smoking and currently smoke or have quit within the past 15 years.  Fecal occult blood test (FOBT) of the stool. You may have this test every year starting at age 80.  Flexible sigmoidoscopy or colonoscopy. You may have a sigmoidoscopy every 5 years or a colonoscopy every 10 years starting at age 80.  Prostate cancer screening. Recommendations will vary depending on your family history and other risks.  Hepatitis C blood test.  Hepatitis B blood test.  Sexually transmitted disease (STD) testing.  Diabetes screening. This is done by checking your blood sugar (glucose) after you have not eaten for a while (fasting). You may have this done every 1-3 years.  Abdominal aortic aneurysm (AAA) screening. You may need this if you are a current or former smoker.  Osteoporosis. You may be screened starting at age 80 if you are at high risk. Talk with your health care provider about your test results, treatment options, and if necessary, the need for more tests. Vaccines  Your health care provider may recommend certain vaccines, such as:  Influenza vaccine. This is recommended every year.  Tetanus, diphtheria, and acellular pertussis (Tdap, Td)  vaccine. You may need a Td booster every 10 years.  Zoster vaccine. You may need this after age 80.  Pneumococcal 13-valent conjugate (PCV13) vaccine. One dose is recommended after age 80.  Pneumococcal polysaccharide (PPSV23) vaccine. One  dose is recommended after age 80. Talk to your health care provider about which screenings and vaccines you need and how often you need them. This information is not intended to replace advice given to you by your health care provider. Make sure you discuss any questions you have with your health care provider. Document Released: 09/06/2015 Document Revised: 04/29/2016 Document Reviewed: 06/11/2015 Elsevier Interactive Patient Education  2017 [employer] Prevention in the Home Falls can cause injuries. They can happen to people of all ages. There are many things you can do to make your home safe and to help prevent falls. What can I do on the outside of my home?  Regularly fix the edges of walkways and driveways and fix any cracks.  Remove anything that might make you trip as you walk through a door, such as a raised step or threshold.  Trim any bushes or trees on the path to your home.  Use bright outdoor lighting.  Clear any walking paths of anything that might make someone trip, such as rocks or tools.  Regularly check to see if handrails are loose or broken. Make sure that both sides of any steps have handrails.  Any raised decks and porches should have guardrails on the edges.  Have any leaves, snow, or ice cleared regularly.  Use sand or salt on walking paths during winter.  Clean up any spills in your garage right away. This includes oil or grease spills. What can I do in the bathroom?  Use night lights.  Install grab bars by the toilet and in the tub and shower. Do not use towel bars as grab bars.  Use non-skid mats or decals in the tub or shower.  If you need to sit down in the shower, use a plastic, non-slip stool.  Keep the floor dry. Clean up any water that spills on the floor as soon as it happens.  Remove soap buildup in the tub or shower regularly.  Attach bath mats securely with double-sided non-slip rug tape.  Do not have throw rugs and other  things on the floor that can make you trip. What can I do in the bedroom?  Use night lights.  Make sure that you have a light by your bed that is easy to reach.  Do not use any sheets or blankets that are too big for your bed. They should not hang down onto the floor.  Have a firm chair that has side arms. You can use this for support while you get dressed.  Do not have throw rugs and other things on the floor that can make you trip. What can I do in the kitchen?  Clean up any spills right away.  Avoid walking on wet floors.  Keep items that you use a lot in easy-to-reach places.  If you need to reach something above you, use a strong step stool that has a grab bar.  Keep electrical cords out of the way.  Do not use floor polish or wax that makes floors slippery. If you must use wax, use non-skid floor wax.  Do not have throw rugs and other things on the floor that can make you trip. What can I do with my stairs?  Do not leave  any items on the stairs.  Make sure that there are handrails on both sides of the stairs and use them. Fix handrails that are broken or loose. Make sure that handrails are as long as the stairways.  Check any carpeting to make sure that it is firmly attached to the stairs. Fix any carpet that is loose or worn.  Avoid having throw rugs at the top or bottom of the stairs. If you do have throw rugs, attach them to the floor with carpet tape.  Make sure that you have a light switch at the top of the stairs and the bottom of the stairs. If you do not have them, ask someone to add them for you. What else can I do to help prevent falls?  Wear shoes that:  Do not have high heels.  Have rubber bottoms.  Are comfortable and fit you well.  Are closed at the toe. Do not wear sandals.  If you use a stepladder:  Make sure that it is fully opened. Do not climb a closed stepladder.  Make sure that both sides of the stepladder are locked into place.  Ask  someone to hold it for you, if possible.  Clearly mark and make sure that you can see:  Any grab bars or handrails.  First and last steps.  Where the edge of each step is.  Use tools that help you move around (mobility aids) if they are needed. These include:  Canes.  Walkers.  Scooters.  Crutches.  Turn on the lights when you go into a dark area. Replace any light bulbs as soon as they burn out.  Set up your furniture so you have a clear path. Avoid moving your furniture around.  If any of your floors are uneven, fix them.  If there are any pets around you, be aware of where they are.  Review your medicines with your doctor. Some medicines can make you feel dizzy. This can increase your chance of falling. Ask your doctor what other things that you can do to help prevent falls. This information is not intended to replace advice given to you by your health care provider. Make sure you discuss any questions you have with your health care provider. Document Released: 06/06/2009 Document Revised: 01/16/2016 Document Reviewed: 09/14/2014 Elsevier Interactive Patient Education  2017 [employer].

## 2017-06-08 ENCOUNTER — Institutional Professional Consult (permissible substitution): Payer: Medicare Other | Admitting: Neurology

## 2017-07-12 ENCOUNTER — Encounter: Payer: Self-pay | Admitting: Nurse Practitioner

## 2017-07-12 ENCOUNTER — Ambulatory Visit (INDEPENDENT_AMBULATORY_CARE_PROVIDER_SITE_OTHER): Payer: Medicare Other | Admitting: Nurse Practitioner

## 2017-07-12 VITALS — BP 154/86 | HR 87 | Temp 97.8°F | Resp 18 | Ht 71.0 in | Wt 244.0 lb

## 2017-07-12 DIAGNOSIS — J40 Bronchitis, not specified as acute or chronic: Secondary | ICD-10-CM | POA: Diagnosis not present

## 2017-07-12 DIAGNOSIS — I10 Essential (primary) hypertension: Secondary | ICD-10-CM

## 2017-07-12 MED ORDER — ZOSTER VAC RECOMB ADJUVANTED 50 MCG/0.5ML IM SUSR: 0.5000 mL | Freq: Once | INTRAMUSCULAR | 1 refills | Status: AC

## 2017-07-12 MED ORDER — BENZONATATE 100 MG PO CAPS: 100.0000 mg | ORAL_CAPSULE | Freq: Three times a day (TID) | ORAL | 0 refills | Status: DC | PRN

## 2017-07-12 NOTE — Progress Notes (Signed)
Careteam: Patient Care Team: Mast, Man X, NP as PCP - General (Internal Medicine)  Advanced Directive information Does Patient Have a Medical Advance Directive?: Yes, Type of Advance Directive: [place];Living will, Does patient want to make changes to medical advance directive?: No - Patient declined  Allergies  Allergen Reactions  . Penicillins     Chief Complaint  Patient presents with  . Acute Visit    Pt is being seen for chest/head congestion, cough, some nasal drainage for 3 days.      HPI: Patient is a 82 y.o. male seen in the office today due to feeling bad for 3 days.  Pt a resident of [facility] [place] who routinely sees Dr Bubba Camp who reports he feels bad for 3 days.  Raw in nasal passage and cough last night.  Using CPAP and coughing into this which made it hard to sleep.  Denies congestion at this time, "not bad right now but sometimes will have it" Taking advil.  No fevers. Bodyaches/weird feels that he associates with cough.  Chest congestion but cough bothers him the most.  Former smoker- quit 30 years ago.  Has not taken anything for cough.  Increase in fatigue. More easily short of breath, but more fatigue than anything.   Recent new eye drop for glaucoma- unsure of the name.  Blood pressure a little elevated from normal.  Review of Systems:  Review of Systems  Constitutional: Positive for malaise/fatigue. Negative for chills and fever.  HENT: Positive for sore throat (mild). Negative for congestion, ear discharge, ear pain, nosebleeds and sinus pain.   Respiratory: Positive for cough and sputum production (green). Negative for shortness of breath.   Cardiovascular: Negative for chest pain.  Neurological: Positive for weakness. Negative for dizziness and headaches.    Past Medical History:  Diagnosis Date  . Apnea, sleep   . Barrett's esophagus   . Gout   . Hypertension    Past Surgical History:  Procedure Laterality  Date  . EYE SURGERY Bilateral 2011-2012   catracts removed  . HERNIA REPAIR  2005   T. Carlton Adam MD  . TONSILECTOMY/ADENOIDECTOMY WITH MYRINGOTOMY  1944   Social History:   reports that he has quit smoking. He has a 15.00 pack-year smoking history. he has never used smokeless tobacco. He reports that he drinks about 1.8 - 2.4 oz of alcohol per week. He reports that he does not use drugs.  Family History  Problem Relation Age of Onset  . Osteoporosis Mother   . Cancer Father 33       esophagus  . Crohn's disease Sister   . Birth defects Maternal Grandmother        colon  . Diabetes Paternal Grandmother     Medications:   Medication List        Accurate as of 07/12/17 11:40 AM. Always use your most recent med list.          allopurinol 100 MG tablet Commonly known as:  ZYLOPRIM   fluticasone 50 MCG/ACT nasal spray Commonly known as:  FLONASE   Melatonin 10 MG Caps   multivitamin with minerals tablet   omeprazole 20 MG capsule Commonly known as:  PRILOSEC   SUMAtriptan 50 MG tablet Commonly known as:  IMITREX   verapamil 180 MG CR tablet Commonly known as:  CALAN-SR        Physical Exam:  Vitals:   07/12/17 1135  BP: (!) 154/86  Pulse: 87  Resp: 18  Temp: 97.8 F (36.6 C)  TempSrc: Oral  SpO2: 96%  Weight: 244 lb (110.7 kg)  Height: 5\' 11"  (1.803 m)   Body mass index is 34.03 kg/m.  Physical Exam  Constitutional: He is oriented to person, place, and time. He appears well-developed and well-nourished. No distress.  Obese elderly male in no distress.   HENT:  Head: Normocephalic and atraumatic.  Nose: Nose normal.  Mouth/Throat: Oropharynx is clear and moist. No oropharyngeal exudate.  Eyes: Conjunctivae and EOM are normal. Pupils are equal, round, and reactive to light. Right eye exhibits no discharge. Left eye exhibits no discharge.  Neck: Normal range of motion. Neck supple.  Cardiovascular: Normal rate, regular rhythm and normal heart  sounds.  Pulmonary/Chest: Effort normal and breath sounds normal.     Musculoskeletal: Normal range of motion.  Lymphadenopathy:    He has no cervical adenopathy.  Neurological: He is alert and oriented to person, place, and time.  Skin: Skin is warm and dry.  Psychiatric: He has a normal mood and affect.    Labs reviewed: Basic Metabolic Panel: No results for input(s): NA, K, CL, CO2, GLUCOSE, BUN, CREATININE, CALCIUM, MG, PHOS, TSH in the last 8760 hours. Liver Function Tests: No results for input(s): AST, ALT, ALKPHOS, BILITOT, PROT, ALBUMIN in the last 8760 hours. No results for input(s): LIPASE, AMYLASE in the last 8760 hours. No results for input(s): AMMONIA in the last 8760 hours. CBC: No results for input(s): WBC, NEUTROABS, HGB, HCT, MCV, PLT in the last 8760 hours. Lipid Panel: No results for input(s): CHOL, HDL, LDLCALC, TRIG, CHOLHDL, LDLDIRECT in the last 8760 hours. TSH: No results for input(s): TSH in the last 8760 hours. A1C: No results found for: HGBA1C   Assessment/Plan 1. Bronchitis -most likely viral. Supportive care at his time.  neti pot twice daily Plain nasal saline spray throughout the day as needed May use tylenol 325 mg 2 tablets every 6 hours as needed aches and pains or sore throat humidifier in the home to help with the dry air if needed Mucinex DM by mouth twice daily as needed for cough and congestion with full glass of water  Keep well hydrated and proper nutrition encouraged.  To use benzonatate 100 mg by mouth every 8 hours as needed cough - benzonatate (TESSALON) 100 MG capsule; Take 1 capsule (100 mg total) 3 (three) times daily as needed by mouth for cough.  Dispense: 20 capsule; Refill: 0  2. Essential hypertension DASH diet given, to cont current medication and to monitor blood pressure. Plans to have clinic nurse at [facility] follow up on blood pressure.   Shinrex vaccine Rx sent to pharmacy for him to take once Bronchitis  resolves.  Return precautions discussed  Carlos American. Harle Battiest  [facility] [phone] 8 am - 5 pm) [phone] (after hours)

## 2017-07-12 NOTE — Patient Instructions (Addendum)
If you are having nasal congestion: neti pot twice daily, Plain nasal saline spray throughout the day as needed, humidifier in the home to help with the dry air. Avoid forcefully blowing nose  FOR ACHES AND PAINS: May use tylenol 325 mg 2 tablets every 6 hours as needed aches and pains or sore throat  FOR COUGH AND CHEST CONGESTION: Mucinex DM by mouth twice daily as needed for cough and congestion with full glass of water  Keep well hydrated To use benzonatate 100 mg by mouth every 8 hours as needed cough   Acute Bronchitis, Adult Acute bronchitis is when air tubes (bronchi) in the lungs suddenly get swollen. The condition can make it hard to breathe. It can also cause these symptoms:  A cough.  Coughing up clear, yellow, or green mucus.  Wheezing.  Chest congestion.  Shortness of breath.  A fever.  Body aches.  Chills.  A sore throat.  Follow these instructions at home: Medicines  Take over-the-counter and prescription medicines only as told by your doctor.  If you were prescribed an antibiotic medicine, take it as told by your doctor. Do not stop taking the antibiotic even if you start to feel better. General instructions  Rest.  Drink enough fluids to keep your pee (urine) clear or pale yellow.  Avoid smoking and secondhand smoke. If you smoke and you need help quitting, ask your doctor. Quitting will help your lungs heal faster.  Use an inhaler, cool mist vaporizer, or humidifier as told by your doctor.  Keep all follow-up visits as told by your doctor. This is important. How is this prevented? To lower your risk of getting this condition again:  Wash your hands often with soap and water. If you cannot use soap and water, use hand sanitizer.  Avoid contact with people who have cold symptoms.  Try not to touch your hands to your mouth, nose, or eyes.  Make sure to get the flu shot every year.  Contact a doctor if:  Your symptoms do not get better in  2 weeks. Get help right away if:  You cough up blood.  You have chest pain.  You have very bad shortness of breath.  You become dehydrated.  You faint (pass out) or keep feeling like you are going to pass out.  You keep throwing up (vomiting).  You have a very bad headache.  Your fever or chills gets worse. This information is not intended to replace advice given to you by your health care provider. Make sure you discuss any questions you have with your health care provider. Document Released: 01/27/2008 Document Revised: 03/18/2016 Document Reviewed: 01/29/2016 Elsevier Interactive Patient Education  2017 [place] DASH stands for "Dietary Approaches to Stop Hypertension." The DASH eating plan is a healthy eating plan that has been shown to reduce high blood pressure (hypertension). It may also reduce your risk for type 2 diabetes, heart disease, and stroke. The DASH eating plan may also help with weight loss. What are tips for following this plan? General guidelines  Avoid eating more than 2,300 mg (milligrams) of salt (sodium) a day. If you have hypertension, you may need to reduce your sodium intake to 1,500 mg a day.  Limit alcohol intake to no more than 1 drink a day for nonpregnant women and 2 drinks a day for men. One drink equals 12 oz of beer, 5 oz of wine, or 1 oz of hard liquor.  Work with  your health care provider to maintain a healthy body weight or to lose weight. Ask what an ideal weight is for you.  Get at least 30 minutes of exercise that causes your heart to beat faster (aerobic exercise) most days of the week. Activities may include walking, swimming, or biking.  Work with your health care provider or diet and nutrition specialist (dietitian) to adjust your eating plan to your individual calorie needs. Reading food labels  Check food labels for the amount of sodium per serving. Choose foods with less than 5 percent of the Daily Value  of sodium. Generally, foods with less than 300 mg of sodium per serving fit into this eating plan.  To find whole grains, look for the word "whole" as the first word in the ingredient list. Shopping  Buy products labeled as "low-sodium" or "no salt added."  Buy fresh foods. Avoid canned foods and premade or frozen meals. Cooking  Avoid adding salt when cooking. Use salt-free seasonings or herbs instead of table salt or sea salt. Check with your health care provider or pharmacist before using salt substitutes.  Do not fry foods. Cook foods using healthy methods such as baking, boiling, grilling, and broiling instead.  Cook with heart-healthy oils, such as olive, canola, soybean, or sunflower oil. Meal planning   Eat a balanced diet that includes: ? 5 or more servings of fruits and vegetables each day. At each meal, try to fill half of your plate with fruits and vegetables. ? Up to 6-8 servings of whole grains each day. ? Less than 6 oz of lean meat, poultry, or fish each day. A 3-oz serving of meat is about the same size as a deck of cards. One egg equals 1 oz. ? 2 servings of low-fat dairy each day. ? A serving of nuts, seeds, or beans 5 times each week. ? Heart-healthy fats. Healthy fats called Omega-3 fatty acids are found in foods such as flaxseeds and coldwater fish, like sardines, salmon, and mackerel.  Limit how much you eat of the following: ? Canned or prepackaged foods. ? Food that is high in trans fat, such as fried foods. ? Food that is high in saturated fat, such as fatty meat. ? Sweets, desserts, sugary drinks, and other foods with added sugar. ? Full-fat dairy products.  Do not salt foods before eating.  Try to eat at least 2 vegetarian meals each week.  Eat more home-cooked food and less restaurant, buffet, and fast food.  When eating at a restaurant, ask that your food be prepared with less salt or no salt, if possible. What foods are recommended? The items  listed may not be a complete list. Talk with your dietitian about what dietary choices are best for you. Grains Whole-grain or whole-wheat bread. Whole-grain or whole-wheat pasta. Brown rice. [place]. Bulgur. Whole-grain and low-sodium cereals. Pita bread. Low-fat, low-sodium crackers. Whole-wheat flour tortillas. Vegetables Fresh or frozen vegetables (raw, steamed, roasted, or grilled). Low-sodium or reduced-sodium tomato and vegetable juice. Low-sodium or reduced-sodium tomato sauce and tomato paste. Low-sodium or reduced-sodium canned vegetables. Fruits All fresh, dried, or frozen fruit. Canned fruit in natural juice (without added sugar). Meat and other protein foods Skinless chicken or [place]. Ground chicken or [place]. Pork with fat trimmed off. Fish and seafood. Egg whites. Dried beans, peas, or lentils. Unsalted nuts, nut butters, and seeds. Unsalted canned beans. Lean cuts of beef with fat trimmed off. Low-sodium, lean deli meat. Dairy Low-fat (1%) or fat-free (skim) milk. Fat-free,  low-fat, or reduced-fat cheeses. Nonfat, low-sodium ricotta or cottage cheese. Low-fat or nonfat yogurt. Low-fat, low-sodium cheese. Fats and oils Soft margarine without trans fats. Vegetable oil. Low-fat, reduced-fat, or light mayonnaise and salad dressings (reduced-sodium). Canola, safflower, olive, soybean, and sunflower oils. Avocado. Seasoning and other foods Herbs. Spices. Seasoning mixes without salt. Unsalted popcorn and pretzels. Fat-free sweets. What foods are not recommended? The items listed may not be a complete list. Talk with your dietitian about what dietary choices are best for you. Grains Baked goods made with fat, such as croissants, muffins, or some breads. Dry pasta or rice meal packs. Vegetables Creamed or fried vegetables. Vegetables in a cheese sauce. Regular canned vegetables (not low-sodium or reduced-sodium). Regular canned tomato sauce and paste (not low-sodium or  reduced-sodium). Regular tomato and vegetable juice (not low-sodium or reduced-sodium). Angie Fava. Olives. Fruits Canned fruit in a light or heavy syrup. Fried fruit. Fruit in cream or butter sauce. Meat and other protein foods Fatty cuts of meat. Ribs. Fried meat. Berniece Salines. Sausage. Bologna and other processed lunch meats. Salami. Fatback. Hotdogs. Bratwurst. Salted nuts and seeds. Canned beans with added salt. Canned or smoked fish. Whole eggs or egg yolks. Chicken or [place] with skin. Dairy Whole or 2% milk, cream, and half-and-half. Whole or full-fat cream cheese. Whole-fat or sweetened yogurt. Full-fat cheese. Nondairy creamers. Whipped toppings. Processed cheese and cheese spreads. Fats and oils Butter. Stick margarine. Lard. Shortening. Ghee. Bacon fat. Tropical oils, such as coconut, palm kernel, or palm oil. Seasoning and other foods Salted popcorn and pretzels. Onion salt, garlic salt, seasoned salt, table salt, and sea salt. Worcestershire sauce. Tartar sauce. Barbecue sauce. Teriyaki sauce. Soy sauce, including reduced-sodium. Steak sauce. Canned and packaged gravies. Fish sauce. Oyster sauce. Cocktail sauce. Horseradish that you find on the shelf. Ketchup. Mustard. Meat flavorings and tenderizers. Bouillon cubes. Hot sauce and Tabasco sauce. Premade or packaged marinades. Premade or packaged taco seasonings. Relishes. Regular salad dressings. Where to find more information:  National Heart, Lung, and [place]: https://wilson-eaton.com/  American Heart Association: www.heart.org Summary  The DASH eating plan is a healthy eating plan that has been shown to reduce high blood pressure (hypertension). It may also reduce your risk for type 2 diabetes, heart disease, and stroke.  With the DASH eating plan, you should limit salt (sodium) intake to 2,300 mg a day. If you have hypertension, you may need to reduce your sodium intake to 1,500 mg a day.  When on the DASH eating plan, aim to eat more  fresh fruits and vegetables, whole grains, lean proteins, low-fat dairy, and heart-healthy fats.  Work with your health care provider or diet and nutrition specialist (dietitian) to adjust your eating plan to your individual calorie needs. This information is not intended to replace advice given to you by your health care provider. Make sure you discuss any questions you have with your health care provider. Document Released: 07/30/2011 Document Revised: 08/03/2016 Document Reviewed: 08/03/2016 Elsevier Interactive Patient Education  2017 [employer].

## 2017-08-23 ENCOUNTER — Other Ambulatory Visit: Payer: Self-pay | Admitting: Nurse Practitioner

## 2017-08-26 ENCOUNTER — Other Ambulatory Visit: Payer: Self-pay

## 2017-08-26 ENCOUNTER — Emergency Department (HOSPITAL_BASED_OUTPATIENT_CLINIC_OR_DEPARTMENT_OTHER): Payer: Medicare Other

## 2017-08-26 ENCOUNTER — Encounter (HOSPITAL_BASED_OUTPATIENT_CLINIC_OR_DEPARTMENT_OTHER): Payer: Self-pay | Admitting: Emergency Medicine

## 2017-08-26 ENCOUNTER — Emergency Department (HOSPITAL_BASED_OUTPATIENT_CLINIC_OR_DEPARTMENT_OTHER)
Admission: EM | Admit: 2017-08-26 | Discharge: 2017-08-26 | Disposition: A | Discharge status: Home / Self Care | Payer: Medicare Other | Attending: Emergency Medicine | Admitting: Emergency Medicine

## 2017-08-26 DIAGNOSIS — Z87891 Personal history of nicotine dependence: Secondary | ICD-10-CM | POA: Diagnosis not present

## 2017-08-26 DIAGNOSIS — Z79899 Other long term (current) drug therapy: Secondary | ICD-10-CM | POA: Diagnosis not present

## 2017-08-26 DIAGNOSIS — J181 Lobar pneumonia, unspecified organism: Secondary | ICD-10-CM

## 2017-08-26 DIAGNOSIS — J189 Pneumonia, unspecified organism: Secondary | ICD-10-CM | POA: Insufficient documentation

## 2017-08-26 DIAGNOSIS — R079 Chest pain, unspecified: Secondary | ICD-10-CM | POA: Diagnosis present

## 2017-08-26 DIAGNOSIS — I10 Essential (primary) hypertension: Secondary | ICD-10-CM | POA: Diagnosis not present

## 2017-08-26 HISTORY — DX: Obesity, unspecified: E66.9

## 2017-08-26 LAB — CBC
HCT: 42.1 % (ref 39.0–52.0)
Hemoglobin: 14.2 g/dL (ref 13.0–17.0)
MCH: 30.1 pg (ref 26.0–34.0)
MCHC: 33.7 g/dL (ref 30.0–36.0)
MCV: 89.2 fL (ref 78.0–100.0)
PLATELETS: 221 10*3/uL (ref 150–400)
RBC: 4.72 MIL/uL (ref 4.22–5.81)
RDW: 15.1 % (ref 11.5–15.5)
WBC: 8.1 10*3/uL (ref 4.0–10.5)

## 2017-08-26 LAB — BASIC METABOLIC PANEL
Anion gap: 9 (ref 5–15)
BUN: 12 mg/dL (ref 6–20)
CALCIUM: 8.8 mg/dL — AB (ref 8.9–10.3)
CHLORIDE: 104 mmol/L (ref 101–111)
CO2: 24 mmol/L (ref 22–32)
CREATININE: 1.15 mg/dL (ref 0.61–1.24)
GFR calc Af Amer: >60 mL/min (ref >60)
GFR calc non Af Amer: 58 mL/min — ABNORMAL LOW (ref >60)
GLUCOSE: 122 mg/dL — AB (ref 65–99)
Potassium: 3.8 mmol/L (ref 3.5–5.1)
Sodium: 137 mmol/L (ref 135–145)

## 2017-08-26 LAB — TROPONIN I: Troponin I: <0.03 ng/mL (ref <0.03)

## 2017-08-26 MED ORDER — BENZONATATE 100 MG PO CAPS: 100.0000 mg | ORAL_CAPSULE | Freq: Three times a day (TID) | ORAL | 0 refills | Status: DC

## 2017-08-26 MED ORDER — LEVOFLOXACIN 750 MG PO TABS
750.0000 mg | ORAL_TABLET | Freq: Once | ORAL | Status: AC
  Administered 2017-08-26: 750 mg via ORAL
  Filled 2017-08-26: qty 1

## 2017-08-26 MED ORDER — ACETAMINOPHEN 325 MG PO TABS
650.0000 mg | ORAL_TABLET | Freq: Once | ORAL | Status: AC
  Administered 2017-08-26: 650 mg via ORAL
  Filled 2017-08-26: qty 2

## 2017-08-26 MED ORDER — LEVOFLOXACIN 500 MG PO TABS: 500.0000 mg | ORAL_TABLET | Freq: Every day | ORAL | 0 refills | Status: DC

## 2017-08-26 NOTE — ED Triage Notes (Signed)
Chest pain across low chest x2 hours that has "mostly passed" now.  Sts he was nauseated and then broke out into a sweat.  After the diaphoresis he started feeling better.  No hx of this type of pain before.

## 2017-08-26 NOTE — ED Notes (Signed)
Patient transported to X-ray 

## 2017-08-26 NOTE — Discharge Instructions (Signed)
Call your physician for plan follow-up appointment.  Return to ER with any worsening symptoms

## 2017-08-26 NOTE — ED Provider Notes (Signed)
[facility] EMERGENCY DEPARTMENT Provider Note   CSN: [id number] Arrival date & time: 08/26/17  1723     History   Chief Complaint Chief Complaint  Patient presents with  . Chest Pain    HPI Eric Chung is a 83 y.o. male. Chief complaint is cough, chest pain.  HPI  83 year old male appears his she's had "bronchitis" for the last week with a cough. Productive of some yellow sputum. No hemoptysis. No fever.  He was sitting at lunch today with his wife. He states he felt some pain in his anterior posterior left chest. States he felt like a chill. He became slightly nauseated. He has some sweats. He states that he took a few sips of water. His nausea cleared his symptoms seemed to improve. He went back to his apartment. There is a nurse on call at the apartment they called. She encouraged him to come be checked.  No recent exertional symptoms. No pressure type pain in the chest. No syncope or presyncope.  States has a history of left ventricular hypertrophy. Prior to abdominal surgery and a cardiac evaluation 10 years ago and was told his heart function was at  "low normal".  Past Medical History:  Diagnosis Date  . Apnea, sleep   . Barrett's esophagus   . Gout   . Hypertension   . Obesity     Patient Active Problem List   Diagnosis Date Noted  . Ventral hernia, recurrent 04/15/2017  . Edema 04/15/2017  . Gout of ankle 04/15/2017  . Hypertension 04/15/2017  . Barrett's esophagus 04/15/2017  . Migraine headache with aura 04/15/2017  . Urinary frequency 04/15/2017  . Sleep apnea in adult 04/15/2017    Past Surgical History:  Procedure Laterality Date  . EYE SURGERY Bilateral 2011-2012   catracts removed  . HERNIA REPAIR  2005   T. Carlton Adam MD  . TONSILECTOMY/ADENOIDECTOMY WITH MYRINGOTOMY  1944       Home Medications    Prior to Admission medications   Medication Sig Start Date End Date Taking? Authorizing Provider  allopurinol (ZYLOPRIM) 100 MG  tablet Take 100 mg by mouth 2 (two) times daily.    [provider]  benzonatate (TESSALON) 100 MG capsule Take 1 capsule (100 mg total) by mouth every 8 (eight) hours. 08/26/17   Tanna Furry, MD  fluticasone (FLONASE) 50 MCG/ACT nasal spray Place 1 spray into both nostrils daily.     [provider]  levofloxacin (LEVAQUIN) 500 MG tablet Take 1 tablet (500 mg total) by mouth daily. 08/26/17   Tanna Furry, MD  Melatonin 10 MG CAPS Take 1 tablet by mouth at bedtime.    [provider]  Multiple Vitamins-Minerals (MULTIVITAMIN WITH MINERALS) tablet Take 1 tablet by mouth daily.    [provider]  omeprazole (PRILOSEC) 20 MG capsule TAKE (1) CAPSULE DAILY. 08/23/17   Mast, Man X, NP  SUMAtriptan (IMITREX) 50 MG tablet Take 50 mg by mouth as needed for migraine. May repeat in 2 hours if headache persists or recurs.    [provider]  verapamil (CALAN-SR) 180 MG CR tablet Take 180 mg by mouth at bedtime.    [provider]    Family History Family History  Problem Relation Age of Onset  . Osteoporosis Mother   . Cancer Father 76       esophagus  . Crohn's disease Sister   . Birth defects Maternal Grandmother        colon  . Diabetes  Paternal Grandmother     Social History Social History   Tobacco Use  . Smoking status: Former Smoker    Packs/day: 1.00    Years: 15.00    Pack years: 15.00  . Smokeless tobacco: Never Used  Substance Use Topics  . Alcohol use: Yes    Alcohol/week: 1.8 - 2.4 oz    Types: 3 - 4 Standard drinks or equivalent per week    Comment: 2-3 beers a week  . Drug use: No     Allergies   Penicillins   Review of Systems Review of Systems  Constitutional: Positive for chills. Negative for appetite change, diaphoresis, fatigue and fever.  HENT: Negative for mouth sores, sore throat and trouble swallowing.   Eyes: Negative for visual disturbance.  Respiratory: Positive for cough. Negative for chest  tightness, shortness of breath and wheezing.   Cardiovascular: Positive for chest pain.  Gastrointestinal: Negative for abdominal distention, abdominal pain, diarrhea, nausea and vomiting.  Endocrine: Negative for polydipsia, polyphagia and polyuria.  Genitourinary: Negative for dysuria, frequency and hematuria.  Musculoskeletal: Negative for gait problem.  Skin: Negative for color change, pallor and rash.  Neurological: Negative for dizziness, syncope, light-headedness and headaches.  Hematological: Does not bruise/bleed easily.  Psychiatric/Behavioral: Negative for behavioral problems and confusion.     Physical Exam Updated Vital Signs BP 119/77   Pulse 65   Temp 97.8 F (36.6 C) (Oral)   Resp 18   Ht 5\' 11"  (1.803 m)   Wt 108.9 kg (240 lb)   SpO2 95%   BMI 33.47 kg/m   Physical Exam  Constitutional: He is oriented to person, place, and time. He appears well-developed and well-nourished. No distress.  Awake and alert 83 year old male.Appears generally the stated age.   HENT:  Head: Normocephalic.  Eyes: Conjunctivae are normal. Pupils are equal, round, and reactive to light. No scleral icterus.  Neck: Normal range of motion. Neck supple. No thyromegaly present.  Cardiovascular: Normal rate and regular rhythm. Exam reveals no gallop and no friction rub.  No murmur heard. Pulmonary/Chest: Effort normal and breath sounds normal. No respiratory distress. He has no wheezes. He has no rales.  Crackles at the left base.  Abdominal: Soft. Bowel sounds are normal. He exhibits no distension. There is no tenderness. There is no rebound.  Musculoskeletal: Normal range of motion.  Neurological: He is alert and oriented to person, place, and time.  Skin: Skin is warm and dry. No rash noted.  Psychiatric: He has a normal mood and affect. His behavior is normal.     ED Treatments / Results  Labs (all labs ordered are listed, but only abnormal results are displayed) Labs Reviewed    BASIC METABOLIC PANEL - Abnormal; Notable for the following components:      Result Value   Glucose, Bld 122 (*)    Calcium 8.8 (*)    GFR calc non Af Amer 58 (*)    All other components within normal limits  CBC  TROPONIN I  TROPONIN I    EKG  EKG Interpretation  Date/Time:  Thursday August 26 2017 17:28:18 EST Ventricular Rate:  76 PR Interval:    QRS Duration: 145 QT Interval:  424 QTC Calculation: 477 R Axis:   -76 Text Interpretation:  Sinus rhythm Left bundle branch block Baseline wander in lead(s) I II aVR aVF V1 V2 Confirmed by Tanna Furry [phone]) on 08/26/2017 6:22:10 PM       Radiology Dg Chest 2 View  Result  Date: 08/26/2017 CLINICAL DATA:  Acute chest pain and shortness of breath today. EXAM: CHEST  2 VIEW COMPARISON:  None. FINDINGS: Mild cardiomegaly noted. Streaky opacities within the posterior left lower lobe noted and may represent airspace disease/ pneumonia. Mild peribronchial thickening identified. There is no evidence of pleural effusion, pulmonary mass or pneumothorax. No acute bony abnormalities are present. IMPRESSION: Posterior left lower lobe opacities which may represent airspace disease/pneumonia. Radiographic follow-up to resolution recommended. Electronically Signed   By: Margarette Canada M.D.   On: 08/26/2017 18:07    Procedures Procedures (including critical care time)  Medications Ordered in ED Medications  levofloxacin (LEVAQUIN) tablet 750 mg (750 mg Oral Given 08/26/17 1839)  acetaminophen (TYLENOL) tablet 650 mg (650 mg Oral Given 08/26/17 1839)     Initial Impression / Assessment and Plan / ED Course  I have reviewed the triage vital signs and the nursing notes.  Pertinent labs & imaging results that were available during my care of the patient were reviewed by me and considered in my medical decision making (see chart for details).    EKG shows LVH and a ventricular conduction delay. I discussed with him left bundle-branch block. He  recalls this is a specific finding of his EKGs in the past per a chest x-ray shows left lower lobe infiltrate. He is not hypoxemic. He is asymptomatic currently. Was given some by mouth Tylenol and by mouth Levaquin here. I did repeat troponin and this remains normal. Convinced that this is not cardiac in nature. Has cough, low-grade fever at 100.1, and infiltrate on chest x-ray. Plan be Levaquin with consideration of his penicillin allergies. Tessalon. ER with acute changes.  Final Clinical Impressions(s) / ED Diagnoses   Final diagnoses:  Chest pain, unspecified type  Community acquired pneumonia of left lower lobe of lung [facility])    ED Discharge Orders        Ordered    levofloxacin (LEVAQUIN) 500 MG tablet  Daily     08/26/17 2149    benzonatate (TESSALON) 100 MG capsule  Every 8 hours     08/26/17 2149       Tanna Furry, MD 08/26/17 2334

## 2017-08-27 ENCOUNTER — Emergency Department (HOSPITAL_COMMUNITY): Payer: Medicare Other

## 2017-08-27 ENCOUNTER — Encounter (HOSPITAL_COMMUNITY): Payer: Self-pay

## 2017-08-27 ENCOUNTER — Emergency Department (HOSPITAL_COMMUNITY)
Admission: EM | Admit: 2017-08-27 | Discharge: 2017-08-27 | Disposition: A | Discharge status: Home / Self Care | Payer: Medicare Other | Attending: Emergency Medicine | Admitting: Emergency Medicine

## 2017-08-27 DIAGNOSIS — R112 Nausea with vomiting, unspecified: Secondary | ICD-10-CM | POA: Insufficient documentation

## 2017-08-27 DIAGNOSIS — R740 Nonspecific elevation of levels of transaminase and lactic acid dehydrogenase [LDH]: Secondary | ICD-10-CM | POA: Insufficient documentation

## 2017-08-27 DIAGNOSIS — Z87891 Personal history of nicotine dependence: Secondary | ICD-10-CM | POA: Diagnosis not present

## 2017-08-27 DIAGNOSIS — K2270 Barrett's esophagus without dysplasia: Secondary | ICD-10-CM | POA: Diagnosis not present

## 2017-08-27 DIAGNOSIS — N50811 Right testicular pain: Secondary | ICD-10-CM

## 2017-08-27 DIAGNOSIS — N50812 Left testicular pain: Secondary | ICD-10-CM | POA: Diagnosis not present

## 2017-08-27 DIAGNOSIS — Z79899 Other long term (current) drug therapy: Secondary | ICD-10-CM | POA: Insufficient documentation

## 2017-08-27 DIAGNOSIS — R1012 Left upper quadrant pain: Secondary | ICD-10-CM | POA: Diagnosis not present

## 2017-08-27 DIAGNOSIS — R7989 Other specified abnormal findings of blood chemistry: Secondary | ICD-10-CM

## 2017-08-27 DIAGNOSIS — I10 Essential (primary) hypertension: Secondary | ICD-10-CM | POA: Insufficient documentation

## 2017-08-27 DIAGNOSIS — N50819 Testicular pain, unspecified: Secondary | ICD-10-CM

## 2017-08-27 LAB — CBC WITH DIFFERENTIAL/PLATELET
BASOS ABS: 0 10*3/uL (ref 0.0–0.1)
Basophils Relative: 0 %
EOS ABS: 0.1 10*3/uL (ref 0.0–0.7)
EOS PCT: 1 %
HCT: 44.5 % (ref 39.0–52.0)
Hemoglobin: 15.1 g/dL (ref 13.0–17.0)
Lymphocytes Relative: 30 %
Lymphs Abs: 3.4 10*3/uL (ref 0.7–4.0)
MCH: 30.2 pg (ref 26.0–34.0)
MCHC: 33.9 g/dL (ref 30.0–36.0)
MCV: 89 fL (ref 78.0–100.0)
Monocytes Absolute: 0.6 10*3/uL (ref 0.1–1.0)
Monocytes Relative: 5 %
Neutro Abs: 7.4 10*3/uL (ref 1.7–7.7)
Neutrophils Relative %: 64 %
PLATELETS: 238 10*3/uL (ref 150–400)
RBC: 5 MIL/uL (ref 4.22–5.81)
RDW: 15 % (ref 11.5–15.5)
WBC: 11.5 10*3/uL — AB (ref 4.0–10.5)

## 2017-08-27 LAB — COMPREHENSIVE METABOLIC PANEL WITH GFR
ALT: 18 U/L (ref 17–63)
AST: 35 U/L (ref 15–41)
Albumin: 3.9 g/dL (ref 3.5–5.0)
Alkaline Phosphatase: 86 U/L (ref 38–126)
Anion gap: 10 (ref 5–15)
BUN: 12 mg/dL (ref 6–20)
CO2: 21 mmol/L — ABNORMAL LOW (ref 22–32)
Calcium: 9.5 mg/dL (ref 8.9–10.3)
Chloride: 104 mmol/L (ref 101–111)
Creatinine, Ser: 1.21 mg/dL (ref 0.61–1.24)
GFR calc Af Amer: >60 mL/min
GFR calc non Af Amer: 55 mL/min — ABNORMAL LOW
Glucose, Bld: 127 mg/dL — ABNORMAL HIGH (ref 65–99)
Potassium: 4.5 mmol/L (ref 3.5–5.1)
Sodium: 135 mmol/L (ref 135–145)
Total Bilirubin: 1.5 mg/dL — ABNORMAL HIGH (ref 0.3–1.2)
Total Protein: 7.7 g/dL (ref 6.5–8.1)

## 2017-08-27 LAB — I-STAT CG4 LACTIC ACID, ED
Lactic Acid, Venous: 2.82 mmol/L (ref 0.5–1.9)
Lactic Acid, Venous: 2.05 mmol/L (ref 0.5–1.9)

## 2017-08-27 LAB — URINALYSIS, ROUTINE W REFLEX MICROSCOPIC
Bacteria, UA: NONE SEEN
Bilirubin Urine: NEGATIVE
GLUCOSE, UA: NEGATIVE mg/dL
Ketones, ur: NEGATIVE mg/dL
Leukocytes, UA: NEGATIVE
NITRITE: NEGATIVE
PROTEIN: NEGATIVE mg/dL
Specific Gravity, Urine: >1.046 — ABNORMAL HIGH (ref 1.005–1.030)
Squamous Epithelial / LPF: NONE SEEN
pH: 5 (ref 5.0–8.0)

## 2017-08-27 LAB — TROPONIN I: Troponin I: <0.03 ng/mL

## 2017-08-27 LAB — BRAIN NATRIURETIC PEPTIDE: B Natriuretic Peptide: 64 pg/mL (ref 0.0–100.0)

## 2017-08-27 LAB — LIPASE, BLOOD: LIPASE: 28 U/L (ref 11–51)

## 2017-08-27 MED ORDER — IOPAMIDOL (ISOVUE-300) INJECTION 61%
INTRAVENOUS | Status: AC
  Administered 2017-08-27: 100 mL via INTRAVENOUS
  Filled 2017-08-27: qty 100

## 2017-08-27 MED ORDER — HYDROCODONE-ACETAMINOPHEN 5-325 MG PO TABS
1.0000 | ORAL_TABLET | Freq: Once | ORAL | Status: AC
  Administered 2017-08-27: 1 via ORAL
  Filled 2017-08-27: qty 1

## 2017-08-27 MED ORDER — ONDANSETRON HCL 4 MG/2ML IJ SOLN
4.0000 mg | Freq: Once | INTRAMUSCULAR | Status: AC
  Administered 2017-08-27: 4 mg via INTRAVENOUS
  Filled 2017-08-27: qty 2

## 2017-08-27 MED ORDER — ONDANSETRON HCL 4 MG PO TABS: 4.0000 mg | ORAL_TABLET | Freq: Four times a day (QID) | ORAL | 0 refills | Status: DC

## 2017-08-27 MED ORDER — GI COCKTAIL ~~LOC~~
30.0000 mL | Freq: Once | ORAL | Status: AC
  Administered 2017-08-27: 30 mL via ORAL
  Filled 2017-08-27: qty 30

## 2017-08-27 MED ORDER — METOCLOPRAMIDE HCL 5 MG/ML IJ SOLN
10.0000 mg | Freq: Once | INTRAMUSCULAR | Status: AC
  Administered 2017-08-27: 10 mg via INTRAVENOUS
  Filled 2017-08-27: qty 2

## 2017-08-27 MED ORDER — SODIUM CHLORIDE 0.9 % IV BOLUS (SEPSIS)
1000.0000 mL | Freq: Once | INTRAVENOUS | Status: AC
  Administered 2017-08-27: 1000 mL via INTRAVENOUS

## 2017-08-27 MED ORDER — DIPHENHYDRAMINE HCL 50 MG/ML IJ SOLN
25.0000 mg | Freq: Once | INTRAMUSCULAR | Status: AC
  Administered 2017-08-27: 25 mg via INTRAVENOUS
  Filled 2017-08-27: qty 1

## 2017-08-27 MED ORDER — HYDROCODONE-ACETAMINOPHEN 5-325 MG PO TABS: 1.0000 | ORAL_TABLET | Freq: Four times a day (QID) | ORAL | 0 refills | Status: DC | PRN

## 2017-08-27 MED ORDER — MORPHINE SULFATE (PF) 4 MG/ML IV SOLN
4.0000 mg | Freq: Once | INTRAVENOUS | Status: AC
  Administered 2017-08-27: 4 mg via INTRAVENOUS
  Filled 2017-08-27: qty 1

## 2017-08-27 MED ORDER — SUCRALFATE 1 G PO TABS: 1.0000 g | ORAL_TABLET | Freq: Three times a day (TID) | ORAL | 1 refills | Status: DC

## 2017-08-27 MED ORDER — MORPHINE SULFATE (PF) 4 MG/ML IV SOLN
4.0000 mg | Freq: Once | INTRAVENOUS | Status: AC
  Administered 2017-08-27: 4 mg via INTRAVENOUS
  Filled 2017-08-27 (×2): qty 1

## 2017-08-27 MED ORDER — IOPAMIDOL (ISOVUE-300) INJECTION 61%
100.0000 mL | Freq: Once | INTRAVENOUS | Status: AC | PRN
  Administered 2017-08-27: 100 mL via INTRAVENOUS

## 2017-08-27 NOTE — ED Provider Notes (Signed)
[facility] DEPT Provider Note   CSN: [id number] Arrival date & time: 08/27/17  0211     History   Chief Complaint Chief Complaint  Patient presents with  . Nausea  . Emesis    HPI Eric Chung is a 83 y.o. male.   The history is provided by the patient.  He had onset this afternoon of pain in his left side of his lower chest and left side of his abdomen.  Pain would come in waves and he would vomit and then feel better.  He denies fever but has had chills and sweats.  There has been no diarrhea.  He denies any cough.  He had been diagnosed with bronchitis and brings up a small amount of green sputum every morning, but no cough during the daytime.  He had gone to [place] where chest x-ray showed pneumonia and he was discharged with prescription for levofloxacin.  Pain is rated at 4/10, and then will worsen to 9/10 before vomiting.  He also notices some slight, temporary relief with belching.  Of note, patient states that he had surgery for hiatal hernia and was told that he would never be able to vomit, but he had a stomach virus several months ago and has been able to vomit since then.  Past Medical History:  Diagnosis Date  . Apnea, sleep   . Barrett's esophagus   . Gout   . Hypertension   . Obesity     Patient Active Problem List   Diagnosis Date Noted  . Ventral hernia, recurrent 04/15/2017  . Edema 04/15/2017  . Gout of ankle 04/15/2017  . Hypertension 04/15/2017  . Barrett's esophagus 04/15/2017  . Migraine headache with aura 04/15/2017  . Urinary frequency 04/15/2017  . Sleep apnea in adult 04/15/2017    Past Surgical History:  Procedure Laterality Date  . EYE SURGERY Bilateral 2011-2012   catracts removed  . HERNIA REPAIR  2005   T. Carlton Adam MD  . TONSILECTOMY/ADENOIDECTOMY WITH MYRINGOTOMY  1944       Home Medications    Prior to Admission medications   Medication Sig Start Date End Date Taking?  Authorizing Provider  allopurinol (ZYLOPRIM) 100 MG tablet Take 100 mg by mouth 2 (two) times daily.    [provider]  benzonatate (TESSALON) 100 MG capsule Take 1 capsule (100 mg total) by mouth every 8 (eight) hours. 08/26/17   Tanna Furry, MD  fluticasone (FLONASE) 50 MCG/ACT nasal spray Place 1 spray into both nostrils daily.     [provider]  levofloxacin (LEVAQUIN) 500 MG tablet Take 1 tablet (500 mg total) by mouth daily. 08/26/17   Tanna Furry, MD  Melatonin 10 MG CAPS Take 1 tablet by mouth at bedtime.    [provider]  Multiple Vitamins-Minerals (MULTIVITAMIN WITH MINERALS) tablet Take 1 tablet by mouth daily.    [provider]  omeprazole (PRILOSEC) 20 MG capsule TAKE (1) CAPSULE DAILY. 08/23/17   Mast, Man X, NP  SUMAtriptan (IMITREX) 50 MG tablet Take 50 mg by mouth as needed for migraine. May repeat in 2 hours if headache persists or recurs.    [provider]  verapamil (CALAN-SR) 180 MG CR tablet Take 180 mg by mouth at bedtime.    [provider]    Family History Family History  Problem Relation Age of Onset  . Osteoporosis Mother   . Cancer Father 72       esophagus  .  Crohn's disease Sister   . Birth defects Maternal Grandmother        colon  . Diabetes Paternal Grandmother     Social History Social History   Tobacco Use  . Smoking status: Former Smoker    Packs/day: 1.00    Years: 15.00    Pack years: 15.00  . Smokeless tobacco: Never Used  Substance Use Topics  . Alcohol use: Yes    Alcohol/week: 1.8 - 2.4 oz    Types: 3 - 4 Standard drinks or equivalent per week    Comment: 2-3 beers a week  . Drug use: No     Allergies   Penicillins   Review of Systems Review of Systems  All other systems reviewed and are negative.    Physical Exam Updated Vital Signs BP 139/79 (BP Location: Right Arm)   Pulse 60   Temp 97.6 F (36.4 C) (Oral)   Resp 18   SpO2 98%   Physical Exam  Nursing  note and vitals reviewed.  83 year old male, appears dyspneic at rest, but is in no acute distress. Vital signs are normal. Oxygen saturation is 98%, which is normal. Head is normocephalic and atraumatic. PERRLA, EOMI. Oropharynx is clear. Neck is nontender and supple without adenopathy or JVD. Back is nontender and there is no CVA tenderness. Lungs have bibasilar rales which are louder on the left.  There are no wheezes or rhonchi. Chest is nontender. Heart has regular rate and rhythm without murmur. Abdomen is soft, flat, with moderate tenderness in the left mid and lower abdomen.  There is no rebound or guarding.  Midline scar is present with small ventral hernia noted.  There are no masses or hepatosplenomegaly and peristalsis is hypoactive. Extremities have no cyanosis or edema, full range of motion is present. Skin is warm and dry without rash. Neurologic: Mental status is normal, cranial nerves are intact, there are no motor or sensory deficits.  ED Treatments / Results  Labs (all labs ordered are listed, but only abnormal results are displayed) Labs Reviewed  COMPREHENSIVE METABOLIC PANEL  CBC WITH DIFFERENTIAL/PLATELET  LIPASE, BLOOD  BRAIN NATRIURETIC PEPTIDE  TROPONIN I  I-STAT CG4 LACTIC ACID, ED    EKG  EKG Interpretation None       Radiology Dg Chest 2 View  Result Date: 08/26/2017 CLINICAL DATA:  Acute chest pain and shortness of breath today. EXAM: CHEST  2 VIEW COMPARISON:  None. FINDINGS: Mild cardiomegaly noted. Streaky opacities within the posterior left lower lobe noted and may represent airspace disease/ pneumonia. Mild peribronchial thickening identified. There is no evidence of pleural effusion, pulmonary mass or pneumothorax. No acute bony abnormalities are present. IMPRESSION: Posterior left lower lobe opacities which may represent airspace disease/pneumonia. Radiographic follow-up to resolution recommended. Electronically Signed   By: Margarette Canada M.D.    On: 08/26/2017 18:07    Procedures Procedures (including critical care time)  Medications Ordered in ED Medications  sodium chloride 0.9 % bolus 1,000 mL (not administered)  morphine 4 MG/ML injection 4 mg (not administered)  ondansetron (ZOFRAN) injection 4 mg (not administered)     Initial Impression / Assessment and Plan / ED Course  I have reviewed the triage vital signs and the nursing notes.  Pertinent labs & imaging results that were available during my care of the patient were reviewed by me and considered in my medical decision making (see chart for details).  Abdominal pain with episodes of emesis.  Old records are reviewed  confirming ED visit yesterday with diagnosis of pneumonia.  I reviewed the x-rays, and the infiltrate noted seems questionable at best.  WBC was normal.  Clinical picture does not seem typical for pneumonia.  Will send for CT of abdomen and pelvis.  Will recheck labs.  WBC has increased slightly, but with normal differential.  Lactic acid level is come back minimally elevated.  This is not felt to be due to sepsis as patient is afebrile and does not show any signs of infection.  He is given IV fluids.  He feels much better following morphine, but pain did recur requiring second dose.  CT scan showed no acute process.  Of note, area on chest x-ray which was felt to be pneumonia shows no evidence of pneumonia on CT scan.  On reexam, he no longer seems dyspneic and seems to be much more comfortable, although he states the pain is recurring again.  He now tells me that he had noted some erythema of his scrotum yesterday and some pain in his testicles.  Genital exam was done and there is no obvious scrotal erythema.  There is mild swelling of the epididymis of both testicles with tenderness concerning for epididymitis.  He will be sent for scrotal ultrasound.  Case is signed out to Dr. Maryan Rued.  Final Clinical Impressions(s) / ED Diagnoses   Final diagnoses:  Left  sided abdominal pain  Elevated lactic acid level  Testicular pain, left  Testicular pain, right    ED Discharge Orders    None      Delora Fuel, MD 84/53/64 [id number]

## 2017-08-27 NOTE — ED Provider Notes (Signed)
10:10 AM Resume care from Dr. Roxanne Mins at 8 AM.  Patient's ultrasound returned with normal scrotal ultrasound without evidence of epididymitis.  UA without evidence of UTI.  On further speaking with the patient he has a history of Barrett's esophagus and has had a Nissen fundoplication in the past.  Patient has not seen a GI doctor recently but does take omeprazole daily.  With where his pain starts which is in the left upper abdomen and lower chest is possible that this is exacerbation of a stomach or esophageal problem.  Patient's labs are reassuring.  Exam is reassuring.  Vital signs are reassuring.  Will discharge patient home to follow-up with GI.  We will have him increase his omeprazole to twice daily.  We will give her antiemetic, Carafate and pain medication.   Blanchie Dessert, MD 08/27/17 1016

## 2017-08-27 NOTE — Discharge Instructions (Signed)
Try eating a bland diet and small meals frequently.  Stop taking the antibiotic.  Increase your omeprazole to 2 times a day for the next 2 weeks

## 2017-08-27 NOTE — ED Triage Notes (Signed)
Pt dx with pneumonia earlier today and tonight he started vomiting

## 2017-08-29 DIAGNOSIS — K810 Acute cholecystitis: Secondary | ICD-10-CM | POA: Insufficient documentation

## 2017-08-29 HISTORY — PX: CHOLECYSTECTOMY: SHX55

## 2017-09-01 ENCOUNTER — Telehealth: Payer: Self-pay

## 2017-09-01 NOTE — Telephone Encounter (Signed)
I called pt to remind him to bring his bipap. No answer, left a message asking him to call me back. If he calls back, please remind him to bring his bipap.

## 2017-09-02 ENCOUNTER — Non-Acute Institutional Stay: Payer: Medicare Other | Admitting: Nurse Practitioner

## 2017-09-02 ENCOUNTER — Telehealth: Payer: Self-pay

## 2017-09-02 ENCOUNTER — Ambulatory Visit: Payer: Medicare Other | Admitting: Neurology

## 2017-09-02 ENCOUNTER — Encounter: Payer: Self-pay | Admitting: Nurse Practitioner

## 2017-09-02 DIAGNOSIS — I10 Essential (primary) hypertension: Secondary | ICD-10-CM | POA: Diagnosis not present

## 2017-09-02 DIAGNOSIS — M10071 Idiopathic gout, right ankle and foot: Secondary | ICD-10-CM

## 2017-09-02 DIAGNOSIS — K2270 Barrett's esophagus without dysplasia: Secondary | ICD-10-CM | POA: Diagnosis not present

## 2017-09-02 DIAGNOSIS — R059 Cough, unspecified: Secondary | ICD-10-CM

## 2017-09-02 DIAGNOSIS — Z9049 Acquired absence of other specified parts of digestive tract: Secondary | ICD-10-CM | POA: Diagnosis not present

## 2017-09-02 DIAGNOSIS — R05 Cough: Secondary | ICD-10-CM

## 2017-09-02 MED ORDER — HEPARIN SODIUM (PORCINE) 5000 UNIT/ML IJ SOLN
INTRAMUSCULAR | Status: DC
Start: 2017-09-01 — End: 2017-09-02

## 2017-09-02 MED ORDER — CIPROFLOXACIN IN D5W 400 MG/200ML IV SOLN
400.00 | INTRAVENOUS | Status: DC
Start: 2017-09-01 — End: 2017-09-02

## 2017-09-02 MED ORDER — GENERIC EXTERNAL MEDICATION
Status: DC
Start: ? — End: 2017-09-02

## 2017-09-02 MED ORDER — METRONIDAZOLE IN NACL 5-0.79 MG/ML-% IV SOLN
500.00 | INTRAVENOUS | Status: DC
Start: 2017-09-01 — End: 2017-09-02

## 2017-09-02 MED ORDER — HYDROCODONE-ACETAMINOPHEN 5-325 MG PO TABS
ORAL_TABLET | ORAL | Status: DC
Start: ? — End: 2017-09-02

## 2017-09-02 MED ORDER — DOCUSATE SODIUM 100 MG PO CAPS
100.00 | ORAL_CAPSULE | ORAL | Status: DC
Start: 2017-09-02 — End: 2017-09-02

## 2017-09-02 MED ORDER — SODIUM CHLORIDE 0.9 % IV SOLN
INTRAVENOUS | Status: DC
Start: ? — End: 2017-09-02

## 2017-09-02 NOTE — Patient Instructions (Signed)
Mucinex 600mg  po bid x 5days, encourage the patient using incentive spirometer 10x/each, up to q2h as needed. Observe for s/s of pneumonia.

## 2017-09-02 NOTE — Assessment & Plan Note (Addendum)
S/p laparoscopic cholecystomy 08/29/17, f/u surgeon in week,  Refer to GI Dr Ronnette Juniper for follow up per patient's request(his wife's GI specialist).

## 2017-09-02 NOTE — Telephone Encounter (Signed)
Pt did not show for their appt with Dr. Athar today.  

## 2017-09-02 NOTE — Assessment & Plan Note (Signed)
GERD stable, continue Omeprazole 20mg qd.  

## 2017-09-02 NOTE — Assessment & Plan Note (Signed)
HTN, blood pressure is controlled, continue Verapamil 180mg  qd.

## 2017-09-02 NOTE — Assessment & Plan Note (Signed)
Hx of gout, no flare ups, continue Allopurinol 100mg  daily

## 2017-09-02 NOTE — Progress Notes (Signed)
Location:   [facility]   Place of Service:  Clinic (12) Provider: Marlana Latus NP  Code Status: DNR Goals of Care: [place] 08/26/2017  Does Patient Have a Medical Advance Directive? Yes  Type of Advance Directive -  Does patient want to make changes to medical advance directive? No - Patient declined  Copy of [place] in Chart? -     Chief Complaint  Patient presents with  . Hospitalization Follow-up    congestion in chest now    HPI: Patient is a 83 y.o. male seen today for hospital follow-up, hospitalized 08/29/17 to 09/01/17 for cholestectomy following experiencing chest pain, abd pain, nausea, vomiting since 08/26/17, ED 08/26/17, ED 08/27/17. F/u Surgeon 09/07/17. He noted sore in abdomen when he coughs or takes a deep breath. He has BM daily, no constipation or diarrhea. He has CT abd, US testicles, CXR, EKG in hospital.   The patient stated he has cough about 2-3 weeks, CXR done 08/26/17 showed left lower lobe Pneumonia, treated Levofloxacin 500mg  Tessalon 100mg  q8h. His cough is improving but not resolving. He denied sputum production, no chest pain, SOB, fever, or O2 desaturation today.    Hx of gout, no flare ups, on Allopurinol 100mg  daily, GERD stable on Omeprazole 20mg  qd, HTN, blood pressure is controlled on Verapamil 180mg  qd.   Past Medical History:  Diagnosis Date  . Apnea, sleep   . Barrett's esophagus   . Gout   . Hypertension   . Obesity     Past Surgical History:  Procedure Laterality Date  . EYE SURGERY Bilateral 2011-2012   catracts removed  . HERNIA REPAIR  2005   T. Carlton Adam MD  . TONSILECTOMY/ADENOIDECTOMY WITH MYRINGOTOMY  5409    Allergies  Allergen Reactions  . Penicillins     Has patient had a PCN reaction causing immediate rash, facial/tongue/throat swelling, SOB or lightheadedness with hypotension: yes, rash Has patient had a PCN reaction causing severe rash involving mucus membranes or skin necrosis: no Has patient had a  PCN reaction that required hospitalization: no Has patient had a PCN reaction occurring within the last 10 years: No If all of the above answers are "NO", then may proceed with Cephalosporin use.     Allergies as of 09/02/2017      Reactions   Penicillins    Has patient had a PCN reaction causing immediate rash, facial/tongue/throat swelling, SOB or lightheadedness with hypotension: yes, rash Has patient had a PCN reaction causing severe rash involving mucus membranes or skin necrosis: no Has patient had a PCN reaction that required hospitalization: no Has patient had a PCN reaction occurring within the last 10 years: No If all of the above answers are "NO", then may proceed with Cephalosporin use.      Medication List        Accurate as of 09/02/17 11:59 PM. Always use your most recent med list.          ADVIL SINUS CONGESTION & PAIN PO Take 1 tablet by mouth every 6 (six) hours as needed (congestion).   allopurinol 100 MG tablet Commonly known as:  ZYLOPRIM Take 100 mg by mouth daily.   benzonatate 100 MG capsule Commonly known as:  TESSALON Take 1 capsule (100 mg total) by mouth every 8 (eight) hours.   fluticasone 50 MCG/ACT nasal spray Commonly known as:  FLONASE Place 1 spray into both nostrils daily.   HYDROcodone-acetaminophen 5-325 MG tablet Commonly known as:  NORCO/VICODIN  Take 1 tablet by mouth every 6 (six) hours as needed for moderate pain.   latanoprost 0.005 % ophthalmic solution Commonly known as:  XALATAN Apply 1 drop to eye at bedtime.   multivitamin with minerals tablet Take 1 tablet by mouth daily.   omeprazole 20 MG capsule Commonly known as:  PRILOSEC TAKE (1) CAPSULE DAILY.   verapamil 180 MG CR tablet Commonly known as:  CALAN-SR Take 180 mg by mouth at bedtime.       Review of Systems:  Review of Systems  Constitutional: Positive for fatigue. Negative for activity change, appetite change, chills and diaphoresis.  HENT: Positive  for congestion and hearing loss. Negative for trouble swallowing and voice change.   Eyes: Negative for visual disturbance.  Respiratory: Positive for cough. Negative for choking, chest tightness, shortness of breath and wheezing.   Cardiovascular: Negative for chest pain, palpitations and leg swelling.  Gastrointestinal: Positive for abdominal pain. Negative for abdominal distention, constipation, diarrhea, nausea and vomiting.  Endocrine: Negative for cold intolerance.  Genitourinary: Negative for difficulty urinating, dysuria, frequency and urgency.  Musculoskeletal: Positive for gait problem.  Skin: Positive for wound. Negative for color change, pallor and rash.       abd punctured surgical wounds x5  Neurological: Negative for tremors, speech difficulty and headaches.  Psychiatric/Behavioral: Negative for agitation, behavioral problems, confusion, hallucinations and sleep disturbance. The patient is not nervous/anxious.     Health Maintenance  Topic Date Due  . PNA vac Low Risk Adult (2 of 2 - PPSV23) 09/13/2015  . TETANUS/TDAP  07/31/2026  . INFLUENZA VACCINE  Completed    Physical Exam: Vitals:   09/02/17 1334  BP: 140/80  Pulse: 93  Resp: 20  Temp: 98.7 F (37.1 C)  SpO2: 95%  Weight: 239 lb 9.6 oz (108.7 kg)  Height: 5\' 11"  (1.803 m)   Body mass index is 33.42 kg/m. Physical Exam  Constitutional: He is oriented to person, place, and time. He appears well-developed and well-nourished. No distress.  HENT:  Head: Normocephalic and atraumatic.  Mouth/Throat: Oropharynx is clear and moist. No oropharyngeal exudate.  Eyes: Conjunctivae and EOM are normal. Pupils are equal, round, and reactive to light. Right eye exhibits no discharge. Left eye exhibits no discharge.  Neck: Normal range of motion. Neck supple. No JVD present. No thyromegaly present.  Cardiovascular: Normal rate, regular rhythm and normal heart sounds.  No murmur heard. Pulmonary/Chest: He has rales.    Posterior mid to lower lung field, posterior right basilar area.   Abdominal: Soft. Bowel sounds are normal. He exhibits no distension. There is tenderness. There is no rebound and no guarding.  Musculoskeletal: Normal range of motion. He exhibits no edema or tenderness.  Neurological: He is alert and oriented to person, place, and time. He has normal reflexes. He exhibits normal muscle tone. Coordination normal.  Skin: Skin is warm and dry. No rash noted. He is not diaphoretic. No erythema.  abd x5 punctured wounds, one has PJ drain, serosanguinous drainage seen    Psychiatric: He has a normal mood and affect. His behavior is normal. Judgment and thought content normal.    Labs reviewed: Basic Metabolic Panel: Recent Labs    08/26/17 1735 08/27/17 0347  NA 137 135  K 3.8 4.5  CL 104 104  CO2 24 21*  GLUCOSE 122* 127*  BUN 12 12  CREATININE 1.15 1.21  CALCIUM 8.8* 9.5   Liver Function Tests: Recent Labs    08/27/17 0347  AST 35  ALT 18  ALKPHOS 86  BILITOT 1.5*  PROT 7.7  ALBUMIN 3.9   Recent Labs    08/27/17 0347  LIPASE 28   No results for input(s): AMMONIA in the last 8760 hours. CBC: Recent Labs    08/26/17 1735 08/27/17 0347  WBC 8.1 11.5*  NEUTROABS  --  7.4  HGB 14.2 15.1  HCT 42.1 44.5  MCV 89.2 89.0  PLT 221 238   Lipid Panel: No results for input(s): CHOL, HDL, LDLCALC, TRIG, CHOLHDL, LDLDIRECT in the last 8760 hours. No results found for: HGBA1C  Procedures since last visit: US Scrotum  Result Date: 08/27/2017 CLINICAL DATA:  Bilateral testicular pain. EXAM: SCROTAL ULTRASOUND DOPPLER ULTRASOUND OF THE TESTICLES TECHNIQUE: Complete ultrasound examination of the testicles, epididymis, and other scrotal structures was performed. Color and spectral Doppler ultrasound were also utilized to evaluate blood flow to the testicles. COMPARISON:  None. FINDINGS: Right testicle Measurements: 3.8 x 2.5 x 2.1 cm. No mass or microlithiasis visualized. Left  testicle Measurements: 3.7 x 3.1 x 2.9 cm. No mass or microlithiasis visualized. Right epididymis:  Normal in size and appearance. Left epididymis:  5 mm cyst is noted. Hydrocele:  None visualized. Varicocele:  Mild left varicocele is noted. Pulsed Doppler interrogation of both testes demonstrates normal low resistance arterial and venous waveforms bilaterally. IMPRESSION: No evidence of testicular mass or torsion. Mild left varicocele is noted. Small left epididymal cyst is noted. Electronically Signed   By: Marijo Conception, M.D.   On: 08/27/2017 09:12   Ct Abdomen Pelvis W Contrast  Result Date: 08/27/2017 CLINICAL DATA:  Initial evaluation for acute nausea, vomiting, abdominal pain. EXAM: CT ABDOMEN AND PELVIS WITH CONTRAST TECHNIQUE: Multidetector CT imaging of the abdomen and pelvis was performed using the standard protocol following bolus administration of intravenous contrast. CONTRAST:  100 cc of Isovue-300. COMPARISON:  None available. FINDINGS: Lower chest: Scattered bibasilar atelectatic changes. Visualized lungs are otherwise clear. Hepatobiliary: Several scattered hepatic cysts noted. Liver otherwise unremarkable. Gallbladder within normal limits. No biliary dilatation. Pancreas: Fatty infiltration the pancreas noted. Pancreas otherwise unremarkable. Spleen: Spleen within normal limits. Adrenals/Urinary Tract: Adrenal glands normal. Kidneys fairly equal in size with symmetric enhancement. Multiple scattered renal cysts noted. 6 mm nonobstructive right renal calculus. 3 mm nonobstructive stone within the lower pole left kidney. No hydronephrosis. No focal enhancing renal mass. No hydroureter. Partially distended bladder within normal limits. Stomach/Bowel: Small hiatal hernia noted. Stomach otherwise unremarkable. No evidence for bowel obstruction. Normal appendix. Colonic diverticulosis without evidence for acute diverticulitis. No acute inflammatory changes seen about the bowels. Small Richter type  hernia containing a portion of the transverse colon noted at the mid abdomen (series 2, image 9). No associated inflammation. Vascular/Lymphatic: Moderate aorto bi-iliac atherosclerotic disease. No aneurysm. Mesenteric vessels patent proximally. No adenopathy. Reproductive: Prostate normal. Other: Small bilateral fat containing inguinal hernias. No free air or fluid. Musculoskeletal: No acute osseus abnormality. No worrisome lytic or blastic osseous lesions. IMPRESSION: 1. No CT evidence for acute intra-abdominal or pelvic process. 2. Colonic diverticulosis without evidence for acute diverticulitis. 3. Bilateral nonobstructive nephrolithiasis. 4. Small Richter type hernia containing a portion of the transverse colon at the anterior mid abdomen without associated inflammation. 5. Atherosclerosis. Electronically Signed   By: Jeannine Boga M.D.   On: 08/27/2017 06:46   [place] Scrotom Doppler  Result Date: 08/27/2017 CLINICAL DATA:  Bilateral testicular pain. EXAM: SCROTAL ULTRASOUND DOPPLER ULTRASOUND OF THE TESTICLES TECHNIQUE: Complete ultrasound examination of the testicles, epididymis, and other scrotal  structures was performed. Color and spectral Doppler ultrasound were also utilized to evaluate blood flow to the testicles. COMPARISON:  None. FINDINGS: Right testicle Measurements: 3.8 x 2.5 x 2.1 cm. No mass or microlithiasis visualized. Left testicle Measurements: 3.7 x 3.1 x 2.9 cm. No mass or microlithiasis visualized. Right epididymis:  Normal in size and appearance. Left epididymis:  5 mm cyst is noted. Hydrocele:  None visualized. Varicocele:  Mild left varicocele is noted. Pulsed Doppler interrogation of both testes demonstrates normal low resistance arterial and venous waveforms bilaterally. IMPRESSION: No evidence of testicular mass or torsion. Mild left varicocele is noted. Small left epididymal cyst is noted. Electronically Signed   By: Marijo Conception, M.D.   On: 08/27/2017 09:12     Assessment/Plan S/P laparoscopic cholecystectomy S/p laparoscopic cholecystomy 08/29/17, f/u surgeon in week,  Refer to GI Dr Ronnette Juniper for follow up per patient's request(his wife's GI specialist).   Cough Cough and some greenish sputum about 3 weeks, he stated he was treated with antibiotics prior to cholecystectomy 08/29/17, better in general.  He denied fever, chest pain, SOB, no palpitation, no O2 desaturation. The patient desires to delay CXR, CBC/diff, CMP, antibiotics, will try Mucinex 600mg  bid x 7days OTC. Encourage incentive spirometer 10x each, up to 2 hr prn.   Gout of ankle Hx of gout, no flare ups, continue Allopurinol 100mg  daily   Barrett's esophagus GERD stable, continue Omeprazole 20mg  qd  Hypertension HTN, blood pressure is controlled, continue Verapamil 180mg  qd.     Labs/tests ordered: none  Next appt:  10/05/2017  Time spend 25 minutes

## 2017-09-02 NOTE — Assessment & Plan Note (Addendum)
Cough and some greenish sputum about 3 weeks, he stated he was treated with antibiotics prior to cholecystectomy 08/29/17, better in general.  He denied fever, chest pain, SOB, no palpitation, no O2 desaturation. The patient desires to delay CXR, CBC/diff, CMP, antibiotics, will try Mucinex 600mg  bid x 7days OTC. Encourage incentive spirometer 10x each, up to 2 hr prn.

## 2017-09-03 ENCOUNTER — Telehealth: Payer: Self-pay | Admitting: *Deleted

## 2017-09-03 NOTE — Telephone Encounter (Signed)
Eric Chung came down to clinic to show me how much volume his lungs could inspired and expired. He measured approximal. 2000 ml, he stated that he had been able to get almost to his set volume amount. He also showed me his right arm where he had an IV, that was slightly red and a little hard as if some IV fluid had infiltrated into his skin. I informed Eric Chung of my findings and she agreed that we will check on him on Monday.

## 2017-09-06 ENCOUNTER — Other Ambulatory Visit: Payer: Self-pay | Admitting: Nurse Practitioner

## 2017-09-08 ENCOUNTER — Encounter: Payer: Self-pay | Admitting: Neurology

## 2017-09-26 ENCOUNTER — Other Ambulatory Visit: Payer: Self-pay | Admitting: Nurse Practitioner

## 2017-09-29 ENCOUNTER — Other Ambulatory Visit: Payer: Self-pay | Admitting: *Deleted

## 2017-09-29 DIAGNOSIS — R059 Cough, unspecified: Secondary | ICD-10-CM

## 2017-09-29 DIAGNOSIS — W109XXD Fall (on) (from) unspecified stairs and steps, subsequent encounter: Secondary | ICD-10-CM

## 2017-09-29 DIAGNOSIS — R05 Cough: Secondary | ICD-10-CM

## 2017-09-29 NOTE — Addendum Note (Signed)
Addended by: Lurena Nida on: 09/29/2017 09:34 AM   Modules accepted: Orders

## 2017-09-29 NOTE — Addendum Note (Signed)
Addended by: Lurena Nida on: 09/29/2017 09:35 AM   Modules accepted: Orders

## 2017-09-30 ENCOUNTER — Ambulatory Visit: Payer: Medicare Other | Admitting: Nurse Practitioner

## 2017-09-30 ENCOUNTER — Other Ambulatory Visit: Payer: Self-pay

## 2017-09-30 ENCOUNTER — Encounter: Payer: Self-pay | Admitting: Nurse Practitioner

## 2017-09-30 DIAGNOSIS — W19XXXA Unspecified fall, initial encounter: Secondary | ICD-10-CM

## 2017-09-30 DIAGNOSIS — K2270 Barrett's esophagus without dysplasia: Secondary | ICD-10-CM

## 2017-09-30 DIAGNOSIS — E782 Mixed hyperlipidemia: Secondary | ICD-10-CM

## 2017-09-30 DIAGNOSIS — M549 Dorsalgia, unspecified: Secondary | ICD-10-CM

## 2017-09-30 DIAGNOSIS — E785 Hyperlipidemia, unspecified: Secondary | ICD-10-CM | POA: Insufficient documentation

## 2017-09-30 LAB — COMPREHENSIVE METABOLIC PANEL
AG Ratio: 1.3 (calc) (ref 1.0–2.5)
ALKALINE PHOSPHATASE (APISO): 96 U/L (ref 40–115)
ALT: 14 U/L (ref 9–46)
AST: 16 U/L (ref 10–35)
Albumin: 3.9 g/dL (ref 3.6–5.1)
BILIRUBIN TOTAL: 0.8 mg/dL (ref 0.2–1.2)
BUN: 16 mg/dL (ref 7–25)
CALCIUM: 9.1 mg/dL (ref 8.6–10.3)
CO2: 25 mmol/L (ref 20–32)
Chloride: 104 mmol/L (ref 98–110)
Creat: 1.09 mg/dL (ref 0.70–1.11)
Globulin: 2.9 g/dL (calc) (ref 1.9–3.7)
Glucose, Bld: 95 mg/dL (ref 65–99)
Potassium: 4.1 mmol/L (ref 3.5–5.3)
Sodium: 138 mmol/L (ref 135–146)
Total Protein: 6.8 g/dL (ref 6.1–8.1)

## 2017-09-30 LAB — LIPID PANEL
CHOLESTEROL: 212 mg/dL — AB (ref <200)
HDL: 45 mg/dL (ref >40)
LDL CHOLESTEROL (CALC): 142 mg/dL — AB
Non-HDL Cholesterol (Calc): 167 mg/dL (calc) — ABNORMAL HIGH (ref <130)
TRIGLYCERIDES: 126 mg/dL (ref <150)
Total CHOL/HDL Ratio: 4.7 (calc) (ref <5.0)

## 2017-09-30 LAB — CBC
HEMATOCRIT: 41.2 % (ref 38.5–50.0)
Hemoglobin: 14.4 g/dL (ref 13.2–17.1)
MCH: 29.8 pg (ref 27.0–33.0)
MCHC: 35 g/dL (ref 32.0–36.0)
MCV: 85.3 fL (ref 80.0–100.0)
MPV: 10.1 fL (ref 7.5–12.5)
Platelets: 237 10*3/uL (ref 140–400)
RBC: 4.83 10*6/uL (ref 4.20–5.80)
RDW: 14.7 % (ref 11.0–15.0)
WBC: 8.1 10*3/uL (ref 3.8–10.8)

## 2017-09-30 NOTE — Assessment & Plan Note (Signed)
Stable, continue Omeprazole 20mg daily.  

## 2017-09-30 NOTE — Assessment & Plan Note (Addendum)
09/30/17 wbc 8.1, Hgb 14.4, plt 237, Na 138, K 4.1, Bun 16, creat 1.09, cholesterol 212, triglycerides 126, LDL 142. Recommend Atorvastatin 20mg  daily, diet, exercise.

## 2017-09-30 NOTE — Assessment & Plan Note (Signed)
pain in th right upper back near the tip of scapular area about 3 weeks, aching in nature, not getting worse,  doesn't interfere night sleep, mostly positional when standing up or lift your right arm. He sustained pain from the  fall when playing basketball while on his cruise ship, he landed on your right head and the right siide body. He denied pain with deep breath or on my palpation.  Will recommend prn Ibuprofen with food, warm compress, Biofreeze, may consider X-ray and PT if no bette. Observe.

## 2017-09-30 NOTE — Assessment & Plan Note (Signed)
Mechanical fall when playing basketball, landed on the right side of head and body, no focal neurological symptoms. Continue to observe.

## 2017-09-30 NOTE — Progress Notes (Signed)
Location: [facility]   Place of Service:   Clinic  Provider: Marlana Latus NP  Code Status: DNR Goals of Care: [place] 08/26/2017  Does Patient Have a Medical Advance Directive? Yes  Type of Advance Directive -  Does patient want to make changes to medical advance directive? No - Patient declined  Copy of [place] in Chart? -     Chief Complaint  Patient presents with  . Acute Visit    Back pain( fell on 09/14/17)    HPI: Patient is a 83 y.o. male seen today for an acute visit for pain in th right upper back near the tip of scapular area about 3 weeks, aching in nature, not getting worse,  doesn't interfere night sleep, mostly positional when standing up or lift your right arm. He sustained pain from the  fall when playing basketball while on his cruise ship, he landed on your right head and the right siide body. He denied pain with deep breath or on my palpation.   He is recovered from recent cholecystectomy, no abd pain, nausea, vomiting. GERD: stable on Omeprazole 20mg  daily.    Past Medical History:  Diagnosis Date  . Apnea, sleep   . Barrett's esophagus   . Gout   . Hypertension   . Obesity     Past Surgical History:  Procedure Laterality Date  . EYE SURGERY Bilateral 2011-2012   catracts removed  . HERNIA REPAIR  2005   T. Carlton Adam MD  . TONSILECTOMY/ADENOIDECTOMY WITH MYRINGOTOMY  1607    Allergies  Allergen Reactions  . Penicillins     Has patient had a PCN reaction causing immediate rash, facial/tongue/throat swelling, SOB or lightheadedness with hypotension: yes, rash Has patient had a PCN reaction causing severe rash involving mucus membranes or skin necrosis: no Has patient had a PCN reaction that required hospitalization: no Has patient had a PCN reaction occurring within the last 10 years: No If all of the above answers are "NO", then may proceed with Cephalosporin use.     Allergies as of 09/30/2017      Reactions   Penicillins    Has patient had a PCN reaction causing immediate rash, facial/tongue/throat swelling, SOB or lightheadedness with hypotension: yes, rash Has patient had a PCN reaction causing severe rash involving mucus membranes or skin necrosis: no Has patient had a PCN reaction that required hospitalization: no Has patient had a PCN reaction occurring within the last 10 years: No If all of the above answers are "NO", then may proceed with Cephalosporin use.      Medication List        Accurate as of 09/30/17 11:59 PM. Always use your most recent med list.          allopurinol 100 MG tablet Commonly known as:  ZYLOPRIM TAKE 1 TABLET BY MOUTH TWICE DAILY.   fluticasone 50 MCG/ACT nasal spray Commonly known as:  FLONASE Place 1 spray into both nostrils daily.   latanoprost 0.005 % ophthalmic solution Commonly known as:  XALATAN Apply 1 drop to eye at bedtime.   multivitamin with minerals tablet Take 1 tablet by mouth daily.   omeprazole 20 MG capsule Commonly known as:  PRILOSEC TAKE (1) CAPSULE DAILY.   verapamil 180 MG CR tablet Commonly known as:  CALAN-SR TAKE ONE TABLET AT BEDTIME.       Review of Systems:  Review of Systems  Constitutional: Negative for activity change, appetite change, chills, diaphoresis,  fatigue and fever.  Respiratory: Negative for cough, shortness of breath and wheezing.   Cardiovascular: Negative for chest pain, palpitations and leg swelling.  Gastrointestinal: Negative for abdominal distention, abdominal pain, nausea and vomiting.  Musculoskeletal: Positive for back pain. Negative for neck pain.  Skin: Negative for color change, pallor and rash.  Neurological: Negative for tremors, facial asymmetry, speech difficulty, weakness and headaches.  Psychiatric/Behavioral: Negative for agitation, behavioral problems, confusion and sleep disturbance. The patient is not nervous/anxious.     Health Maintenance  Topic Date Due  . PNA vac Low  Risk Adult (2 of 2 - PPSV23) 09/13/2015  . TETANUS/TDAP  07/31/2026  . INFLUENZA VACCINE  Completed    Physical Exam: Vitals:   09/30/17 1335  BP: 122/78  Pulse: 98  Resp: 20  Temp: 98 F (36.7 C)  SpO2: 98%  Weight: 236 lb (107 kg)  Height: 5\' 11"  (1.803 m)   Body mass index is 32.92 kg/m. Physical Exam  Constitutional: He is oriented to person, place, and time. He appears well-developed and well-nourished. No distress.  HENT:  Head: Normocephalic and atraumatic.  Eyes: Conjunctivae and EOM are normal. Pupils are equal, round, and reactive to light. Right eye exhibits no discharge. Left eye exhibits no discharge.  Neck: Normal range of motion. Neck supple. No JVD present. No thyromegaly present.  Cardiovascular: Normal rate and regular rhythm.  No murmur heard. Pulmonary/Chest: Effort normal. He has no wheezes. He has rales.  Bibasilar rales  Abdominal: Soft. Bowel sounds are normal. He exhibits no distension. There is no tenderness.  Musculoskeletal: Normal range of motion. He exhibits tenderness. He exhibits no edema.  Right upper back, near the tip of the right scapular, aching in nature, positional when standing up or lift the right arm over head. No pain with deep breath or palpation.   Neurological: He is alert and oriented to person, place, and time. He exhibits normal muscle tone. Coordination normal.  Skin: Skin is warm and dry. He is not diaphoretic. No erythema.  Psychiatric: He has a normal mood and affect. His behavior is normal. Judgment and thought content normal.    Labs reviewed: Basic Metabolic Panel: Recent Labs    08/26/17 1735 08/27/17 0347 09/30/17 0700  NA 137 135 138  K 3.8 4.5 4.1  CL 104 104 104  CO2 24 21* 25  GLUCOSE 122* 127* 95  BUN 12 12 16   CREATININE 1.15 1.21 1.09  CALCIUM 8.8* 9.5 9.1   Liver Function Tests: Recent Labs    08/27/17 0347 09/30/17 0700  AST 35 16  ALT 18 14  ALKPHOS 86  --   BILITOT 1.5* 0.8  PROT 7.7 6.8   ALBUMIN 3.9  --    Recent Labs    08/27/17 0347  LIPASE 28   No results for input(s): AMMONIA in the last 8760 hours. CBC: Recent Labs    08/26/17 1735 08/27/17 0347 09/30/17 0700  WBC 8.1 11.5* 8.1  NEUTROABS  --  7.4  --   HGB 14.2 15.1 14.4  HCT 42.1 44.5 41.2  MCV 89.2 89.0 85.3  PLT 221 238 237   Lipid Panel: Recent Labs    09/30/17 0700  CHOL 212*  HDL 45  TRIG 126  CHOLHDL 4.7   No results found for: HGBA1C  Procedures since last visit: No results found.  Assessment/Plan Upper back pain on right side pain in th right upper back near the tip of scapular area about 3 weeks, aching in  nature, not getting worse,  doesn't interfere night sleep, mostly positional when standing up or lift your right arm. He sustained pain from the  fall when playing basketball while on his cruise ship, he landed on your right head and the right siide body. He denied pain with deep breath or on my palpation.  Will recommend prn Ibuprofen with food, warm compress, Biofreeze, may consider X-ray and PT if no bette. Observe.   Barrett's esophagus Stable, continue Omeprazole 20mg  daily.   Fall Mechanical fall when playing basketball, landed on the right side of head and body, no focal neurological symptoms. Continue to observe.   Hyperlipidemia 09/30/17 wbc 8.1, Hgb 14.4, plt 237, Na 138, K 4.1, Bun 16, creat 1.09, cholesterol 212, triglycerides 126, LDL 142. Recommend Atorvastatin 20mg  daily, diet, exercise.     Labs/tests ordered:   Next appt:  10/12/2017  Time spend 25 minutes.

## 2017-10-11 ENCOUNTER — Encounter: Payer: Self-pay | Admitting: Neurology

## 2017-10-11 ENCOUNTER — Ambulatory Visit: Payer: Medicare Other | Admitting: Neurology

## 2017-10-11 VITALS — BP 136/86 | HR 96 | Ht 71.0 in | Wt 236.5 lb

## 2017-10-11 DIAGNOSIS — Z789 Other specified health status: Secondary | ICD-10-CM

## 2017-10-11 DIAGNOSIS — G4733 Obstructive sleep apnea (adult) (pediatric): Secondary | ICD-10-CM | POA: Diagnosis not present

## 2017-10-11 NOTE — Patient Instructions (Addendum)
Please continue using your BiPAP regularly. While your insurance requires that you use your BiPAP at least 4 hours each night on 70% of the nights, I recommend, that you not skip any nights and use it throughout the night if you can. Getting used to BiPAP and staying with the treatment long term does take time and patience and discipline. Untreated obstructive sleep apnea when it is moderate to severe can have an adverse impact on cardiovascular health and raise her risk for heart disease, arrhythmias, hypertension, congestive heart failure, stroke and diabetes. Untreated obstructive sleep apnea causes sleep disruption, nonrestorative sleep, and sleep deprivation. This can have an impact on your day to day functioning and cause daytime sleepiness and impairment of cognitive function, memory loss, mood disturbance, and problems focussing. Using BiPAP regularly can improve these symptoms. We can see you in 3 months, you can see one of our nurse practitioners as you are stable.  We will reduce your pressure to 16/12 cm and add a chinstrap.  Please drop off your data card from the BiPAP machine in about 6 weeks so we can look at your download again and possibly make additional changes at the time.

## 2017-10-11 NOTE — Progress Notes (Signed)
Subjective:    Patient ID: Eric Chung is a 83 y.o. male.  HPI     Interim history:   Eric Chung is an 83 year old right-handed gentleman with an underlying medical history of gout, reflux disease with Barrett's esophagus, hypertension, and obesity, who presents for follow-up consultation of his obstructive sleep apnea, treated with BiPAP ST. The patient is unaccompanied today. Of note, patient missed an appointment on 09/02/2017. I first met him on 05/24/2017 at the request of his primary care physician, at which time the patient reported a recent diagnosis of sleep apnea and recently starting BiPAP therapy when he was still residing in [place]. He was compliant with treatment but was still struggling with the mask and treatment settings. He was advised to come in for a mask refit appointment. He was given a mask to try at home by our sleep lab manager, Shirlean Mylar, and was also instructed to increase his humidity level for better tolerance. I encouraged him to continue treatment and follow-up routinely in 3 months.   Today, 10/11/2017: I reviewed his BiPAP ST compliance data from 09/12/2017 through 10/11/2017 which is a total of 30 days, during which time he used his BiPAP machine 28 days with percent used days greater than 4 hours at 80%, indicating very good compliance but average usage of only 4 hours of 43 minutes, residual AHI suboptimal at 12.9 per hour, leak on the high side consistently, pressure at 18/14 cm with a backup rate of 12. With an average usage of 4 hours and 43 minutes. Leak is consistently high at 117.9 L/m, pressure of 18/14 with a rate of 12. He uses a fullface mask. He has been using a larger size after he came for the mask fit and are sleep lab. Nevertheless, he still has complaints of mouth and eye dryness. Residual AHI 12.9 per hour at this time. His ramp time is 45 minutes. He feels that the pressure is too high for his tolerance level. He does move quite a bit during sleep  and tosses and turns. Overall, he is trying the best he can use his BiPAP. He has not tried a chinstrap yet.   The patient's allergies, current medications, family history, past medical history, past social history, past surgical history and problem list were reviewed and updated as appropriate.   Previously (copied from previous notes for reference):   05/24/2017: (He) was recently diagnosed with obstructive sleep apnea and placed on BiPAP ST. Prior sleep study results were reviewed today. He had a split-night sleep study on 02/18/2017 at a sleep lab facility in [place]. Baseline sleep study results indicated an AHI of 61.2 per hour. He had no significant PLMS. CPAP was titrated from 5 cm to 8 cm and he was switched to BiPAP of 19/15 due to central events noted. He was placed on a backup rate due to central apneas and hypopneas persisting. Optimal pressure was determined to be 17-19 cm over 15-16 cm. He was fitted with an air touch fullface mask. I reviewed your office note from 04/15/2017. A BiPAP download was reviewed today, 05/03/2017 through 05/23/2017, which is a total of 21 days, during which time he used his BiPAP every night with percent used days greater than 4 hours at 86%, indicating very good compliance with an average usage of 5 hours and 9 minutes, residual AHI 8.6 per hour, leak very high with the 95th percentile at 100 L/m on a pressure of 18/14 with a rate of 12. He reports difficulty tolerating  the treatment. He has a history of eye problems. He has been seeing an eye doctor, he has been seeing a specialist for dry eyes, he has been given the lubricating ointment to put in his eyes and also was advised to start using goggles at night to prevent the BiPAP air to go into his right eye. He is using a simplus fullface mask size medium. He has 2 other kinds of masks at the house. He needs to use a fullface mask but also has a full beard. He feels like the mask rides up and he has leakage from the  mouth. He has increased his ramp time to 45 minutes to adjust better. He has been on treatment for 3 weeks and is not necessarily feeling any better yet. His wife reports that his snoring is much better and the machine is quiet enough for her to be able to sleep. They moved from [place] to [facility] home independent living. He is a retired Network engineer. He quit smoking in 1987, drinks alcohol 2-3 times per week, drinks tea typically in the morning, typically no coffee or sodas. Of note, he has gained weight since his move in the past 6 months, in the realm of 20 pounds he estimates. He denies restless leg symptoms or morning headaches. Epworth sleepiness score is 9 out of 24, fatigue score is 41 out of 63. He goes to bed around 10:30 to 11 PM, WT around 7:30.  Prior to his attendant split-night sleep study he had a home sleep test on 01/05/2017 which indicated an AHI of about 46 per hour. He has some difficulty falling asleep and staying asleep, nocturia about 1-2 per night.   His Past Medical History Is Significant For: Past Medical History:  Diagnosis Date  . Apnea, sleep   . Barrett's esophagus   . Gout   . Hypertension   . Obesity     His Past Surgical History Is Significant For: Past Surgical History:  Procedure Laterality Date  . EYE SURGERY Bilateral 2011-2012   catracts removed  . HERNIA REPAIR  2005   T. Carlton Adam MD  . TONSILECTOMY/ADENOIDECTOMY WITH MYRINGOTOMY  1944    His Family History Is Significant For: Family History  Problem Relation Age of Onset  . Osteoporosis Mother   . Cancer Father 21       esophagus  . Crohn's disease Sister   . Birth defects Maternal Grandmother        colon  . Diabetes Paternal Grandmother     His Social History Is Significant For: Social History   Socioeconomic History  . Marital status: Married    Spouse name: Inez Catalina  . Number of children: 1  . Years of education: None  . Highest education level: None  Social Needs  . Financial  resource strain: None  . Food insecurity - worry: None  . Food insecurity - inability: None  . Transportation needs - medical: None  . Transportation needs - non-medical: None  Occupational History  . None  Tobacco Use  . Smoking status: Former Smoker    Packs/day: 1.00    Years: 15.00    Pack years: 15.00  . Smokeless tobacco: Never Used  Substance and Sexual Activity  . Alcohol use: Yes    Alcohol/week: 1.8 - 2.4 oz    Types: 3 - 4 Standard drinks or equivalent per week    Comment: 2-3 beers a week  . Drug use: No  . Sexual activity: Not Currently  Other  Topics Concern  . None  Social History Narrative   Social History     Marital status: Married ,1986            Spouse name: Inez Catalina                    Years of education:                 Number of children: 1                Occupational History: Pharmacist, hospital     None on file      Social History Main Topics     Smoking status:                       Smokeless tobacco: Not on file                        Alcohol use: 3-4 drinks per week     Drug use: Unknown     Sexual activity: Not on file            Other Topics            Concern     None on file      Social History Narrative     None on file             His Allergies Are:  Allergies  Allergen Reactions  . Penicillins     Has patient had a PCN reaction causing immediate rash, facial/tongue/throat swelling, SOB or lightheadedness with hypotension: yes, rash Has patient had a PCN reaction causing severe rash involving mucus membranes or skin necrosis: no Has patient had a PCN reaction that required hospitalization: no Has patient had a PCN reaction occurring within the last 10 years: No If all of the above answers are "NO", then may proceed with Cephalosporin use.   :  His Current Medications Are:  Outpatient Encounter Medications as of 10/11/2017  Medication Sig  . allopurinol (ZYLOPRIM) 100 MG tablet TAKE 1 TABLET BY MOUTH TWICE DAILY.  . fluticasone (FLONASE)  50 MCG/ACT nasal spray Place 1 spray into both nostrils daily.   Marland Kitchen latanoprost (XALATAN) 0.005 % ophthalmic solution Apply 1 drop to eye at bedtime.  . Multiple Vitamins-Minerals (MULTIVITAMIN WITH MINERALS) tablet Take 1 tablet by mouth daily.  Marland Kitchen omeprazole (PRILOSEC) 20 MG capsule TAKE (1) CAPSULE DAILY.  . [DISCONTINUED] verapamil (CALAN-SR) 180 MG CR tablet TAKE ONE TABLET AT BEDTIME.   No facility-administered encounter medications on file as of 10/11/2017.   :  Review of Systems:  Out of a complete 14 point review of systems, all are reviewed and negative with the exception of these symptoms as listed below:  Review of Systems  Neurological:       Patient reports that he is doing ok, he just has a question about the cost of his machine.     Objective:  Neurological Exam  Physical Exam Physical Examination:   Vitals:   10/11/17 1452  BP: 136/86  Pulse: 96   General Examination: The patient is a very pleasant 83 y.o. male in no acute distress. He appears well-developed and well-nourished and well groomed.   HEENT: Normocephalic, atraumatic, pupils are equal, round and reactive to light and accommodation. Extraocular tracking is good without limitation to gaze excursion or nystagmus noted. Normal smooth pursuit is noted. Hearing is grossly intact. Face is symmetric with  normal facial animation and normal facial sensation. Speech is clear with no dysarthria noted. There is no hypophonia. There is no lip, neck/head, jaw or voice tremor. Neck is supple with full range of passive and active motion. Oropharynx exam reveals: mild mouth dryness, adequate dental hygiene and moderate airway crowding.  Chest: Clear to auscultation but some bibasilar crackles noted.  Heart: S1+S2+0, regular and normal without murmurs, rubs or gallops noted.   Abdomen: Soft, non-tender and non-distended with normal bowel sounds appreciated on auscultation.  Extremities: There is trace pitting edema in  the distal lower extremities bilaterally.   Skin: Warm and dry without trophic changes noted.  Musculoskeletal: exam reveals no obvious joint deformities, tenderness or joint swelling or erythema.   Neurologically:  Mental status: The patient is awake, alert and oriented in all 4 spheres. His immediate and remote memory, attention, language skills and fund of knowledge are appropriate. There is no evidence of aphasia, agnosia, apraxia or anomia. Speech is clear with normal prosody and enunciation. Thought process is linear. Mood is normal and affect is normal.  Cranial nerves II - XII are as described above under HEENT exam. Motor exam: Normal bulk, strength and tone is noted. There is no drift, tremor or rebound. Fine motor skills and coordination: grossly intact.  Cerebellar testing: No dysmetria or intention tremor. There is no truncal or gait ataxia.  Sensory exam: intact to light touch in the upper and lower extremities.  Gait, station and balance: He stands easily. No veering to one side is noted. No leaning to one side is noted. Posture is age-appropriate and stance is narrow based. Gait shows normal stride length and normal pace. No problems turning are noted.  Assessment and Plan:  In summary, Eric Chung is a very pleasant 83 year old male with an underlying medical history of gout, reflux disease with Barrett's esophagus, hypertension, and obesity, who presents for follow-up consultation of his obstructive sleep apnea he is compliant with BiPAP ST. He is struggling with the mask and also with pressure. We mutually agreed to reduce his pressure 216/12 at this time from 18/14, rate of 12. He is using a fullface mask. Is advised to consider using a chinstrap which I prescribed. He is advised to call us in about 6 weeks for a repeat download. He may have to drop of his compliance data chest that as we cannot do a remote download. He is agreeable to this. Based on those results we may  make further changes. He is commended for his treatment adherence despite difficulties. Of note, he had a split-night sleep study done in [place] on 02/18/2017. He was titrated on CPAP and then on BiPAP ST. He is advised to follow-up in about 3 months with one of her nurse practitioners. I answered all his questions today and he was in agreement, ultimately, he may benefit from a full night titration study to optimize treatment settings and treatment level. His residual AHI is suboptimal around 12.9/ per hour at this time and leak is rather high consistently.  I spent 25 minutes in total face-to-face time with the patient, more than 50% of which was spent in counseling and coordination of care, reviewing test results, reviewing medication and discussing or reviewing the diagnosis of OSA, its prognosis and treatment options. Pertinent laboratory and imaging test results that were available during this visit with the patient were reviewed by me and considered in my medical decision making (see chart for details).

## 2017-10-12 ENCOUNTER — Encounter: Payer: Self-pay | Admitting: Internal Medicine

## 2017-10-26 ENCOUNTER — Encounter: Payer: Self-pay | Admitting: Internal Medicine

## 2017-10-26 ENCOUNTER — Non-Acute Institutional Stay: Payer: Medicare Other | Admitting: Internal Medicine

## 2017-10-26 VITALS — BP 130/64 | HR 65 | Temp 97.9°F | Resp 16 | Ht 71.0 in | Wt 244.2 lb

## 2017-10-26 DIAGNOSIS — G4730 Sleep apnea, unspecified: Secondary | ICD-10-CM | POA: Diagnosis not present

## 2017-10-26 DIAGNOSIS — M10071 Idiopathic gout, right ankle and foot: Secondary | ICD-10-CM

## 2017-10-26 DIAGNOSIS — K2270 Barrett's esophagus without dysplasia: Secondary | ICD-10-CM | POA: Diagnosis not present

## 2017-10-26 DIAGNOSIS — Z9049 Acquired absence of other specified parts of digestive tract: Secondary | ICD-10-CM

## 2017-10-26 DIAGNOSIS — E782 Mixed hyperlipidemia: Secondary | ICD-10-CM

## 2017-10-26 DIAGNOSIS — E669 Obesity, unspecified: Secondary | ICD-10-CM | POA: Insufficient documentation

## 2017-10-26 DIAGNOSIS — I10 Essential (primary) hypertension: Secondary | ICD-10-CM | POA: Diagnosis not present

## 2017-10-26 NOTE — Progress Notes (Signed)
[facility]  Provider: Blanchie Serve MD   Location:  [place] of Service:  Clinic (12)  PCP: Mast, Man X, NP Patient Care Team: Mast, Man X, NP as PCP - General (Internal Medicine)  Extended Emergency Contact Information Primary Emergency Contact: Oborn,Betty Address: [address], [address], [place] [address] [place] Phone: [phone] Mobile Phone: [phone] Relation: Spouse  Goals of Care: Advanced Directive information Advanced Directives 08/26/2017  Does Patient Have a Medical Advance Directive? Yes  Type of Advance Directive -  Does patient want to make changes to medical advance directive? No - Patient declined  Copy of [place] in Chart? -     Chief Complaint  Patient presents with  . Medical Management of Chronic Issues    routine follow up visit  . Medication Refill    No refills needed at this time    HPI: Patient is a 83 y.o. male seen today for routine visit. He has been using BiPAP machine for OSA. The mask fits better than before and needs a new chin strap. He has been sleeping a lot during day time. He underwent cholecystectomy for acute cholecystitis on 08/29/17. Currently on allopurinol for gout. Denies recent flare up. Takes omeprazole for reflux disease with Barrett's and symptoms are controlled. Tolerating flonase well. Last colonoscopy in 2018 with some small polyps, next has been recommended in 2021. Pending GI OV note. Had precancerous polyps seen on colonoscopy in 2015. Takes eye drops for glaucoma. Mentions he has been taking verapamil for blood pressure, medication not on his current list. Does not know the dose.   Past Medical History:  Diagnosis Date  . Apnea, sleep   . Barrett's esophagus   . Gout   . Hypertension   . Obesity    Past Surgical History:  Procedure Laterality Date  . CHOLECYSTECTOMY  08/29/2017  . EYE SURGERY Bilateral 2011-2012    catracts removed  . HERNIA REPAIR  2005   T. Carlton Adam MD  . TONSILECTOMY/ADENOIDECTOMY WITH MYRINGOTOMY  1944    reports that he has quit smoking. He has a 15.00 pack-year smoking history. he has never used smokeless tobacco. He reports that he drinks about 1.8 - 2.4 oz of alcohol per week. He reports that he does not use drugs. Social History   Socioeconomic History  . Marital status: Married    Spouse name: Inez Catalina  . Number of children: 1  . Years of education: Not on file  . Highest education level: Not on file  Social Needs  . Financial resource strain: Not on file  . Food insecurity - worry: Not on file  . Food insecurity - inability: Not on file  . Transportation needs - medical: Not on file  . Transportation needs - non-medical: Not on file  Occupational History  . Not on file  Tobacco Use  . Smoking status: Former Smoker    Packs/day: 1.00    Years: 15.00    Pack years: 15.00  . Smokeless tobacco: Never Used  Substance and Sexual Activity  . Alcohol use: Yes    Alcohol/week: 1.8 - 2.4 oz    Types: 3 - 4 Standard drinks or equivalent per week    Comment: 2-3 beers a week  . Drug use: No  . Sexual activity: Not Currently  Other Topics Concern  . Not on file  Social  History Narrative   Social History     Marital status: Married ,1986            Spouse name: Inez Catalina                    Years of education:                 Number of children: 1                Occupational History: Pharmacist, hospital     None on file      Social History Main Topics     Smoking status:                       Smokeless tobacco: Not on file                        Alcohol use: 3-4 drinks per week     Drug use: Unknown     Sexual activity: Not on file            Other Topics            Concern     None on file      Social History Narrative     None on file             Functional Status Survey:    Family History  Problem Relation Age of Onset  . Osteoporosis Mother   . Cancer Father 109        esophagus  . Crohn's disease Sister   . Birth defects Maternal Grandmother        colon  . Diabetes Paternal Grandmother     Health Maintenance  Topic Date Due  . PNA vac Low Risk Adult (2 of 2 - PPSV23) 09/13/2015  . TETANUS/TDAP  07/31/2026  . INFLUENZA VACCINE  Completed    Allergies  Allergen Reactions  . Penicillins     Has patient had a PCN reaction causing immediate rash, facial/tongue/throat swelling, SOB or lightheadedness with hypotension: yes, rash Has patient had a PCN reaction causing severe rash involving mucus membranes or skin necrosis: no Has patient had a PCN reaction that required hospitalization: no Has patient had a PCN reaction occurring within the last 10 years: No If all of the above answers are "NO", then may proceed with Cephalosporin use.     Outpatient Encounter Medications as of 10/26/2017  Medication Sig  . allopurinol (ZYLOPRIM) 100 MG tablet TAKE 1 TABLET BY MOUTH TWICE DAILY.  . fluticasone (FLONASE) 50 MCG/ACT nasal spray Place 1 spray into both nostrils daily.   Marland Kitchen latanoprost (XALATAN) 0.005 % ophthalmic solution Apply 1 drop to eye at bedtime.  . Multiple Vitamins-Minerals (MULTIVITAMIN WITH MINERALS) tablet Take 1 tablet by mouth daily.  Marland Kitchen omeprazole (PRILOSEC) 20 MG capsule TAKE (1) CAPSULE DAILY.   No facility-administered encounter medications on file as of 10/26/2017.     Review of Systems  Constitutional: Negative for appetite change, chills, fatigue and fever.  HENT: Positive for postnasal drip. Negative for congestion, mouth sores, nosebleeds, rhinorrhea, sore throat, tinnitus and trouble swallowing.   Eyes: Positive for itching. Negative for pain and redness.       Air leakage from his BiPAP bothers his eyes  Respiratory: Positive for apnea. Negative for cough, choking, shortness of breath and wheezing.   Cardiovascular: Negative for chest pain, palpitations and leg swelling.  Gastrointestinal: Positive  for constipation.  Negative for abdominal pain, blood in stool, diarrhea, nausea and vomiting.       Has bowel movement once a day, has hard stool and has to strain. No blood in stool. He takes stool softener 1 tab daily for 2 days in a row and has loose stool. Tries to take more fruits in his diet  Genitourinary: Negative for dysuria and hematuria.       Wakes up 1-3 times at night to urinate  Musculoskeletal: Positive for back pain. Negative for gait problem.       Occassional upper back pain with some to right shoulder at times. He had a fall in Jan 2019 while playing basketball and hurt his right shoulder.   Skin: Negative for rash.  Neurological: Positive for light-headedness. Negative for dizziness, syncope, numbness and headaches.  Hematological: Does not bruise/bleed easily.  Psychiatric/Behavioral: Negative for behavioral problems and hallucinations. The patient is not nervous/anxious.     Vitals:   10/26/17 0922  BP: 130/64  Pulse: 65  Resp: 16  Temp: 97.9 F (36.6 C)  TempSrc: Oral  SpO2: 94%  Weight: 244 lb 3.2 oz (110.8 kg)  Height: 5' 11" (1.803 m)   Body mass index is 34.06 kg/m.   Wt Readings from Last 3 Encounters:  10/26/17 244 lb 3.2 oz (110.8 kg)  10/11/17 236 lb 8 oz (107.3 kg)  09/30/17 236 lb (107 kg)   Physical Exam  Constitutional: He is oriented to person, place, and time.  Obese elderly male in no acute distress   HENT:  Head: Normocephalic and atraumatic.  Right Ear: External ear normal.  Left Ear: External ear normal.  Nose: Nose normal.  Mouth/Throat: Oropharynx is clear and moist. No oropharyngeal exudate.  Eyes: Conjunctivae and EOM are normal. Pupils are equal, round, and reactive to light. Right eye exhibits no discharge. Left eye exhibits no discharge. No scleral icterus.  Neck: Normal range of motion. Neck supple.  Cardiovascular: Normal rate and regular rhythm.  Pulmonary/Chest: Effort normal. No respiratory distress. He has no wheezes. He has rales.    Has rales to right lung base that clears some with coughing  Abdominal: Soft. Bowel sounds are normal. He exhibits distension. There is no tenderness. There is no rebound.  Musculoskeletal: He exhibits no edema.  Able to move all 4 extremities  Lymphadenopathy:    He has no cervical adenopathy.  Neurological: He is alert and oriented to person, place, and time.  Skin: Skin is warm and dry. No rash noted. He is not diaphoretic.  Psychiatric: He has a normal mood and affect.    Labs reviewed: Basic Metabolic Panel: Recent Labs    08/26/17 1735 08/27/17 0347 09/30/17 0700  NA 137 135 138  K 3.8 4.5 4.1  CL 104 104 104  CO2 24 21* 25  GLUCOSE 122* 127* 95  BUN _0 CREATININE 1.15 1.21 1.09  CALCIUM 8.8* 9.5 9.1   Liver Function Tests: Recent Labs    08/27/17 0347 09/30/17 0700  AST 35 16  ALT 18 14  ALKPHOS 86  --   BILITOT 1.5* 0.8  PROT 7.7 6.8  ALBUMIN 3.9  --    Recent Labs    08/27/17 0347  LIPASE 28   No results for input(s): AMMONIA in the last 8760 hours. CBC: Recent Labs    08/26/17 1735 08/27/17 0347 09/30/17 0700  WBC 8.1 11.5* 8.1  NEUTROABS  --  7.4  --   HGB  14.2 15.1 14.4  HCT 42.1 44.5 41.2  MCV 89.2 89.0 85.3  PLT 221 238 237   Cardiac Enzymes: Recent Labs    08/26/17 1735 08/26/17 2007 08/27/17 0347  TROPONINI <0.03 <0.03 <0.03   BNP: Invalid input(s): POCBNP No results found for: HGBA1C No results found for: TSH No results found for: VITAMINB12 No results found for: FOLATE No results found for: IRON, TIBC, FERRITIN  Lipid Panel: Recent Labs    09/30/17 0700  CHOL 212*  HDL 45  TRIG 126  CHOLHDL 4.7   No results found for: HGBA1C  Procedures since last visit: No results found.  Assessment/Plan  1. Barrett's esophagus without dysplasia Continue PPI. Followed by GI. Request notes - CMP with eGFR([facility]); Future - Lipid Panel; Future  2. Sleep apnea in adult Continue to use BiPAP machine, explained that  his daytime sleepiness could be due to leakage of air from BiPAP and thus this not having its desired effect. Pt waiting on new chin strap to see if this provide better fit.  - CMP with eGFR([facility]); Future - Hemoglobin A1c; Future  3. Essential hypertension Continue verapamil- need to verify dosing and frequency, CMP reviewed.   4. Idiopathic gout of right ankle, unspecified chronicity C/w allopurinol for now  5. S/P laparoscopic cholecystectomy No abdominal pain reported.   6. Mixed hyperlipidemia Does not want to start statin at present. Diet and exercise counselling. Reading material on fat and cholesterol restricted diet provided. Encouraged to exercise for at least 30 minutes 5 days a week.  - Lipid Panel; Future - Hemoglobin A1c; Future  7. Obesity (BMI 30-39.9) Dietary and exercise counselling as above. Check lipid and a1c next lab - Hemoglobin A1c; Future    Labs/tests ordered:   Lab Orders     CMP with eGFR([facility])     Lipid Panel     Hemoglobin A1c   Next appointment: 3-4 months with Man Darlina Rumpf  Communication: reviewed care plan with patient     Blanchie Serve, MD Internal Medicine [place], [place] [address] Cell Phone (Monday-Friday 8 am - 5 pm): [phone] On Call: [phone] and follow prompts after 5 pm and on weekends Office Phone: [phone] Office Fax: [phone]

## 2017-10-26 NOTE — Patient Instructions (Signed)
Do Call us and let us know about your verapamil dosing and frequency today.    Fat and Cholesterol Restricted Diet Getting too much fat and cholesterol in your diet may cause health problems. Following this diet helps keep your fat and cholesterol at normal levels. This can keep you from getting sick. What types of fat should I choose?  Choose monosaturated and polyunsaturated fats. These are found in foods such as olive oil, canola oil, flaxseeds, walnuts, almonds, and seeds.  Eat more omega-3 fats. Good choices include salmon, mackerel, sardines, tuna, flaxseed oil, and ground flaxseeds.  Limit saturated fats. These are in animal products such as meats, butter, and cream. They can also be in plant products such as palm oil, palm kernel oil, and coconut oil.  Avoid foods with partially hydrogenated oils in them. These contain trans fats. Examples of foods that have trans fats are stick margarine, some tub margarines, cookies, crackers, and other baked goods. What general guidelines do I need to follow?  Check food labels. Look for the words "trans fat" and "saturated fat."  When preparing a meal: ? Fill half of your plate with vegetables and green salads. ? Fill one fourth of your plate with whole grains. Look for the word "whole" as the first word in the ingredient list. ? Fill one fourth of your plate with lean protein foods.  Eat more foods that have fiber, like apples, carrots, beans, peas, and barley.  Eat more home-cooked foods. Eat less at restaurants and buffets.  Limit or avoid alcohol.  Limit foods high in starch and sugar.  Limit fried foods.  Cook foods without frying them. Baking, boiling, grilling, and broiling are all great options.  Lose weight if you are overweight. Losing even a small amount of weight can help your overall health. It can also help prevent diseases such as diabetes and heart disease. What foods can I eat? Grains Whole grains, such as whole  wheat or whole grain breads, crackers, cereals, and pasta. Unsweetened oatmeal, bulgur, barley, quinoa, or brown rice. Corn or whole wheat flour tortillas. Vegetables Fresh or frozen vegetables (raw, steamed, roasted, or grilled). Green salads. Fruits All fresh, canned (in natural juice), or frozen fruits. Meat and Other Protein Products Ground beef (85% or leaner), grass-fed beef, or beef trimmed of fat. Skinless chicken or [place]. Ground chicken or [place]. Pork trimmed of fat. All fish and seafood. Eggs. Dried beans, peas, or lentils. Unsalted nuts or seeds. Unsalted canned or dry beans. Dairy Low-fat dairy products, such as skim or 1% milk, 2% or reduced-fat cheeses, low-fat ricotta or cottage cheese, or plain low-fat yogurt. Fats and Oils Tub margarines without trans fats. Light or reduced-fat mayonnaise and salad dressings. Avocado. Olive, canola, sesame, or safflower oils. Natural peanut or almond butter (choose ones without added sugar and oil). The items listed above may not be a complete list of recommended foods or beverages. Contact your dietitian for more options. What foods are not recommended? Grains White bread. White pasta. White rice. Cornbread. Bagels, pastries, and croissants. Crackers that contain trans fat. Vegetables White potatoes. Corn. Creamed or fried vegetables. Vegetables in a cheese sauce. Fruits Dried fruits. Canned fruit in light or heavy syrup. Fruit juice. Meat and Other Protein Products Fatty cuts of meat. Ribs, chicken wings, bacon, sausage, bologna, salami, chitterlings, fatback, hot dogs, bratwurst, and packaged luncheon meats. Liver and organ meats. Dairy Whole or 2% milk, cream, half-and-half, and cream cheese. Whole milk cheeses. Whole-fat or sweetened yogurt. Full-fat  cheeses. Nondairy creamers and whipped toppings. Processed cheese, cheese spreads, or cheese curds. Sweets and Desserts Corn syrup, sugars, honey, and molasses. Candy. Jam and jelly.  Syrup. Sweetened cereals. Cookies, pies, cakes, donuts, muffins, and ice cream. Fats and Oils Butter, stick margarine, lard, shortening, ghee, or bacon fat. Coconut, palm kernel, or palm oils. Beverages Alcohol. Sweetened drinks (such as sodas, lemonade, and fruit drinks or punches). The items listed above may not be a complete list of foods and beverages to avoid. Contact your dietitian for more information. This information is not intended to replace advice given to you by your health care provider. Make sure you discuss any questions you have with your health care provider. Document Released: 02/09/2012 Document Revised: 04/16/2016 Document Reviewed: 11/09/2013 Elsevier Interactive Patient Education  [employer].

## 2017-10-29 ENCOUNTER — Encounter: Payer: Self-pay | Admitting: Internal Medicine

## 2017-10-29 DIAGNOSIS — D126 Benign neoplasm of colon, unspecified: Secondary | ICD-10-CM | POA: Insufficient documentation

## 2017-11-22 ENCOUNTER — Other Ambulatory Visit: Payer: Self-pay | Admitting: Nurse Practitioner

## 2017-12-27 ENCOUNTER — Other Ambulatory Visit: Payer: Self-pay | Admitting: Nurse Practitioner

## 2018-01-13 ENCOUNTER — Other Ambulatory Visit: Payer: Self-pay | Admitting: Nurse Practitioner

## 2018-01-27 DIAGNOSIS — E669 Obesity, unspecified: Secondary | ICD-10-CM

## 2018-01-27 DIAGNOSIS — G4730 Sleep apnea, unspecified: Secondary | ICD-10-CM

## 2018-01-27 DIAGNOSIS — K2270 Barrett's esophagus without dysplasia: Secondary | ICD-10-CM

## 2018-01-27 DIAGNOSIS — E782 Mixed hyperlipidemia: Secondary | ICD-10-CM

## 2018-01-28 LAB — COMPLETE METABOLIC PANEL WITH GFR
AG Ratio: 1.3 (calc) (ref 1.0–2.5)
ALBUMIN MSPROF: 3.9 g/dL (ref 3.6–5.1)
ALKALINE PHOSPHATASE (APISO): 84 U/L (ref 40–115)
ALT: 16 U/L (ref 9–46)
AST: 19 U/L (ref 10–35)
BILIRUBIN TOTAL: 0.8 mg/dL (ref 0.2–1.2)
BUN / CREAT RATIO: 14 (calc) (ref 6–22)
BUN: 16 mg/dL (ref 7–25)
CHLORIDE: 106 mmol/L (ref 98–110)
CO2: 25 mmol/L (ref 20–32)
CREATININE: 1.13 mg/dL — AB (ref 0.70–1.11)
Calcium: 9 mg/dL (ref 8.6–10.3)
GFR, EST AFRICAN AMERICAN: 70 mL/min/{1.73_m2} (ref >=60)
GFR, Est Non African American: 61 mL/min/{1.73_m2} (ref >=60)
GLOBULIN: 3 g/dL (ref 1.9–3.7)
Glucose, Bld: 100 mg/dL — ABNORMAL HIGH (ref 65–99)
Potassium: 4.2 mmol/L (ref 3.5–5.3)
SODIUM: 138 mmol/L (ref 135–146)
Total Protein: 6.9 g/dL (ref 6.1–8.1)

## 2018-01-28 LAB — LIPID PANEL
CHOLESTEROL: 196 mg/dL (ref <200)
HDL: 42 mg/dL (ref >40)
LDL CHOLESTEROL (CALC): 128 mg/dL — AB
Non-HDL Cholesterol (Calc): 154 mg/dL (calc) — ABNORMAL HIGH (ref <130)
TRIGLYCERIDES: 144 mg/dL (ref <150)
Total CHOL/HDL Ratio: 4.7 (calc) (ref <5.0)

## 2018-01-28 LAB — HEMOGLOBIN A1C
EAG (MMOL/L): 6.6 (calc)
Hgb A1c MFr Bld: 5.8 % of total Hgb — ABNORMAL HIGH (ref <5.7)
Mean Plasma Glucose: 120 (calc)

## 2018-01-31 ENCOUNTER — Encounter: Payer: Self-pay | Admitting: Nurse Practitioner

## 2018-01-31 DIAGNOSIS — I129 Hypertensive chronic kidney disease with stage 1 through stage 4 chronic kidney disease, or unspecified chronic kidney disease: Secondary | ICD-10-CM | POA: Insufficient documentation

## 2018-01-31 DIAGNOSIS — N182 Chronic kidney disease, stage 2 (mild): Secondary | ICD-10-CM

## 2018-02-01 ENCOUNTER — Encounter: Payer: Self-pay | Admitting: Internal Medicine

## 2018-02-03 ENCOUNTER — Non-Acute Institutional Stay: Payer: Medicare Other | Admitting: Nurse Practitioner

## 2018-02-03 DIAGNOSIS — N182 Chronic kidney disease, stage 2 (mild): Secondary | ICD-10-CM | POA: Diagnosis not present

## 2018-02-03 DIAGNOSIS — E782 Mixed hyperlipidemia: Secondary | ICD-10-CM

## 2018-02-03 DIAGNOSIS — G4730 Sleep apnea, unspecified: Secondary | ICD-10-CM

## 2018-02-03 DIAGNOSIS — R609 Edema, unspecified: Secondary | ICD-10-CM

## 2018-02-03 DIAGNOSIS — M10071 Idiopathic gout, right ankle and foot: Secondary | ICD-10-CM | POA: Diagnosis not present

## 2018-02-03 DIAGNOSIS — I10 Essential (primary) hypertension: Secondary | ICD-10-CM | POA: Diagnosis not present

## 2018-02-03 DIAGNOSIS — I129 Hypertensive chronic kidney disease with stage 1 through stage 4 chronic kidney disease, or unspecified chronic kidney disease: Secondary | ICD-10-CM

## 2018-02-03 DIAGNOSIS — H409 Unspecified glaucoma: Secondary | ICD-10-CM | POA: Diagnosis not present

## 2018-02-03 DIAGNOSIS — K2270 Barrett's esophagus without dysplasia: Secondary | ICD-10-CM | POA: Diagnosis not present

## 2018-02-03 DIAGNOSIS — R7303 Prediabetes: Secondary | ICD-10-CM | POA: Diagnosis not present

## 2018-02-03 NOTE — Assessment & Plan Note (Signed)
Stable, continue Omeprazole.  

## 2018-02-03 NOTE — Assessment & Plan Note (Signed)
Stable, under Ophthalmology care.

## 2018-02-03 NOTE — Patient Instructions (Signed)
Continue life style modification, diet,  exercise to have better control of blood sugar, cholesterol. F/u in clinic [place] in 6 months. Prescription of [facility] [address] [place]

## 2018-02-03 NOTE — Assessment & Plan Note (Signed)
01/27/18 GFR 61

## 2018-02-03 NOTE — Assessment & Plan Note (Signed)
Hgb a1c 5.8%, desires to life style modification, diet, exercise to better control blood sugar.

## 2018-02-03 NOTE — Assessment & Plan Note (Signed)
01/27/18 cholesterol 196, triglycerides 144, HDL 42, LDL 128. The patient stated he will do better with diet and exercise to avoid statin for now.

## 2018-02-03 NOTE — Assessment & Plan Note (Signed)
Stable, continue Allopurinol.  

## 2018-02-03 NOTE — Assessment & Plan Note (Signed)
HTN, blood pressure is controlled, continue Verapamil 180mg  qd.

## 2018-02-03 NOTE — Progress Notes (Signed)
Location:   clinic [place]   Place of Service:  Clinic (12) Provider: Marlana Latus NP  Code Status: DNR Goals of Care: [place] Advanced Directives 08/26/2017  Does Patient Have a Medical Advance Directive? Yes  Type of Advance Directive -  Does patient want to make changes to medical advance directive? No - Patient declined  Copy of [place] in Chart? -     Chief Complaint  Patient presents with  . Medical Management of Chronic Issues    3 mo f/u    HPI: Patient is a 83 y.o. male seen today for evaluation of chronic medical conditons.   He has history of sleep apnea, only wears CPAP about a few hours at night due to intolerance. Hx of Glaucoma, under ophthalmology care, stable. He has Hgb a1c 5.8%, LDL 128 01/28/18, he desires to diet and exercise to have better control of blood sugar and cholesterol. No gouty flare ups, on Allopurinol 100mg  daily. GERD stable on Omeprazole 20mg  qd. HTN, blood pressure is controlled on Verapamil 180mg  qd.   Past Medical History:  Diagnosis Date  . Apnea, sleep   . Barrett's esophagus   . Gout   . Hypertension   . Obesity     Past Surgical History:  Procedure Laterality Date  . CHOLECYSTECTOMY  08/29/2017  . EYE SURGERY Bilateral 2011-2012   catracts removed  . HERNIA REPAIR  2005   T. Carlton Adam MD  . TONSILECTOMY/ADENOIDECTOMY WITH MYRINGOTOMY  4401    Allergies  Allergen Reactions  . Penicillins     Has patient had a PCN reaction causing immediate rash, facial/tongue/throat swelling, SOB or lightheadedness with hypotension: yes, rash Has patient had a PCN reaction causing severe rash involving mucus membranes or skin necrosis: no Has patient had a PCN reaction that required hospitalization: no Has patient had a PCN reaction occurring within the last 10 years: No If all of the above answers are "NO", then may proceed with Cephalosporin use.     Allergies as of 02/03/2018      Reactions   Penicillins    Has patient had a  PCN reaction causing immediate rash, facial/tongue/throat swelling, SOB or lightheadedness with hypotension: yes, rash Has patient had a PCN reaction causing severe rash involving mucus membranes or skin necrosis: no Has patient had a PCN reaction that required hospitalization: no Has patient had a PCN reaction occurring within the last 10 years: No If all of the above answers are "NO", then may proceed with Cephalosporin use.      Medication List        Accurate as of 02/03/18 11:59 PM. Always use your most recent med list.          allopurinol 100 MG tablet Commonly known as:  ZYLOPRIM TAKE 1 TABLET BY MOUTH TWICE DAILY.   fluticasone 50 MCG/ACT nasal spray Commonly known as:  FLONASE Place 1 spray into both nostrils daily.   latanoprost 0.005 % ophthalmic solution Commonly known as:  XALATAN Apply 1 drop to eye at bedtime.   multivitamin with minerals tablet Take 1 tablet by mouth daily.   omeprazole 20 MG capsule Commonly known as:  PRILOSEC TAKE (1) CAPSULE DAILY.   verapamil 180 MG CR tablet Commonly known as:  CALAN-SR TAKE ONE TABLET AT BEDTIME.       Review of Systems:  Review of Systems  Constitutional: Negative for activity change, appetite change, chills, diaphoresis, fatigue and fever.  HENT: Positive for hearing loss. Negative  for congestion, trouble swallowing and voice change.   Eyes: Positive for visual disturbance.       Glaucoma  Respiratory: Positive for apnea and cough. Negative for choking, shortness of breath and wheezing.        Sleep apnea. Hacking cough sometimes.   Cardiovascular: Positive for leg swelling. Negative for chest pain and palpitations.  Gastrointestinal: Negative for abdominal distention, abdominal pain, constipation, diarrhea, nausea and vomiting.  Genitourinary: Negative for difficulty urinating, dysuria and urgency.  Musculoskeletal: Positive for gait problem. Negative for joint swelling.  Neurological: Negative for  dizziness, speech difficulty, weakness and headaches.  Psychiatric/Behavioral: Negative for agitation, behavioral problems, confusion, hallucinations and sleep disturbance. The patient is not nervous/anxious.     Health Maintenance  Topic Date Due  . PNA vac Low Risk Adult (2 of 2 - PPSV23) 09/13/2015  . INFLUENZA VACCINE  03/24/2018  . TETANUS/TDAP  07/31/2026    Physical Exam: Vitals:   02/03/18 1335  BP: 122/68  Pulse: 78  Resp: 20  Temp: 97.7 F (36.5 C)  SpO2: 94%  Weight: 240 lb (108.9 kg)  Height: 5\' 11"  (1.803 m)   Body mass index is 33.47 kg/m. Physical Exam  Constitutional: He is oriented to person, place, and time. He appears well-developed and well-nourished. No distress.  HENT:  Head: Normocephalic and atraumatic.  Eyes: EOM are normal.  Neck: Normal range of motion. Neck supple. No JVD present. No thyromegaly present.  Cardiovascular: Normal rate, regular rhythm and intact distal pulses.  No murmur heard. DPs  Pulmonary/Chest: He has rales.  Posterior right lung base  Abdominal: Soft. Bowel sounds are normal. He exhibits no distension. There is no tenderness.  Musculoskeletal: He exhibits edema.  Trace edema BLE  Neurological: He is alert and oriented to person, place, and time. No cranial nerve deficit. He exhibits normal muscle tone. Coordination normal.  Skin: Skin is warm and dry. He is not diaphoretic.  Psychiatric: He has a normal mood and affect. His behavior is normal. Judgment and thought content normal.    Labs reviewed: Basic Metabolic Panel: Recent Labs    08/27/17 0347 09/30/17 0700 01/27/18 0730  NA 135 138 138  K 4.5 4.1 4.2  CL 104 104 106  CO2 21* 25 25  GLUCOSE 127* 95 100*  BUN 12 16 16   CREATININE 1.21 1.09 1.13*  CALCIUM 9.5 9.1 9.0   Liver Function Tests: Recent Labs    08/27/17 0347 09/30/17 0700 01/27/18 0730  AST 35 16 19  ALT 18 14 16   ALKPHOS 86  --   --   BILITOT 1.5* 0.8 0.8  PROT 7.7 6.8 6.9  ALBUMIN  3.9  --   --    Recent Labs    08/27/17 0347  LIPASE 28   No results for input(s): AMMONIA in the last 8760 hours. CBC: Recent Labs    08/26/17 1735 08/27/17 0347 09/30/17 0700  WBC 8.1 11.5* 8.1  NEUTROABS  --  7.4  --   HGB 14.2 15.1 14.4  HCT 42.1 44.5 41.2  MCV 89.2 89.0 85.3  PLT 221 238 237   Lipid Panel: Recent Labs    09/30/17 0700 01/27/18 0730  CHOL 212* 196  HDL 45 42  LDLCALC 142* 128*  TRIG 126 144  CHOLHDL 4.7 4.7   Lab Results  Component Value Date   HGBA1C 5.8 (H) 01/27/2018    Procedures since last visit: No results found.  Assessment/Plan Sleep apnea in adult wears CPAP about a  few hours at night due to intolerance.  Glaucoma Stable, under Ophthalmology care.   Prediabetes Hgb a1c 5.8%, desires to life style modification, diet, exercise to better control blood sugar.   Hyperlipidemia 01/27/18 cholesterol 196, triglycerides 144, HDL 42, LDL 128. The patient stated he will do better with diet and exercise to avoid statin for now.   Edema Trace edema BLE has no change.   Gout of ankle Stable, continue Allopurinol.   Hypertension HTN, blood pressure is controlled, continue Verapamil 180mg  qd.     Barrett's esophagus Stable, continue Omeprazole.   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

## 2018-02-03 NOTE — Assessment & Plan Note (Signed)
wears CPAP about a few hours at night due to intolerance.

## 2018-02-03 NOTE — Assessment & Plan Note (Signed)
Trace edema BLE has no change.

## 2018-02-04 ENCOUNTER — Encounter: Payer: Self-pay | Admitting: Nurse Practitioner

## 2018-02-04 ENCOUNTER — Other Ambulatory Visit: Payer: Self-pay | Admitting: Nurse Practitioner

## 2018-02-04 DIAGNOSIS — Z23 Encounter for immunization: Secondary | ICD-10-CM

## 2018-02-15 ENCOUNTER — Ambulatory Visit: Payer: Medicare Other | Admitting: Adult Health

## 2018-03-30 ENCOUNTER — Encounter: Payer: Self-pay | Admitting: Adult Health

## 2018-03-30 ENCOUNTER — Ambulatory Visit: Payer: Medicare Other | Admitting: Adult Health

## 2018-03-30 ENCOUNTER — Encounter

## 2018-03-30 VITALS — BP 116/66 | HR 79 | Ht 71.0 in | Wt 240.0 lb

## 2018-03-30 DIAGNOSIS — G4733 Obstructive sleep apnea (adult) (pediatric): Secondary | ICD-10-CM | POA: Diagnosis not present

## 2018-03-30 NOTE — Progress Notes (Addendum)
PATIENT: Eric Chung DOB: [birth year]  REASON FOR VISIT: follow up HISTORY FROM: patient  HISTORY OF PRESENT ILLNESS: Today 03/30/18:  Eric Chung is an 83 year old male with a history of obstructive sleep apnea on BiPAP he returns today for an evaluation.  His download indicates that he use his machine 30 out of 30 days for compliance of 100%.  He uses machine greater than 4 hours 23 days for compliance of 77%.  On average he uses his machine 4 hours and 44 minutes.  His residual AHI is 15.9 on 16/12 with respiratory rate of 12.  His leak  in the 95th percentile is 119.8 L/min.  The patient states that he does feel the mask leaking at night.  He returns today for evaluation.  HISTORY 10/11/2017: I reviewed his BiPAP ST compliance data from 09/12/2017 through 10/11/2017 which is a total of 30 days, during which time he used his BiPAP machine 28 days with percent used days greater than 4 hours at 80%, indicating very good compliance but average usage of only 4 hours of 43 minutes, residual AHI suboptimal at 12.9 per hour, leak on the high side consistently, pressure at 18/14 cm with a backup rate of 12. With an average usage of 4 hours and 43 minutes. Leak is consistently high at 117.9 L/m, pressure of 18/14 with a rate of 12. He uses a fullface mask. He has been using a larger size after he came for the mask fit and are sleep lab. Nevertheless, he still has complaints of mouth and eye dryness. Residual AHI 12.9 per hour at this time. His ramp time is 45 minutes. He feels that the pressure is too high for his tolerance level. He does move quite a bit during sleep and tosses and turns. Overall, he is trying the best he can use his BiPAP. He has not tried a chinstrap yet.    REVIEW OF SYSTEMS: Out of a complete 14 system review of symptoms, the patient complains only of the following symptoms, and all other reviewed systems are negative.  Fatigue severity score 29 Epworth sleepiness score  13  ALLERGIES: Allergies  Allergen Reactions  . Penicillins     Has patient had a PCN reaction causing immediate rash, facial/tongue/throat swelling, SOB or lightheadedness with hypotension: yes, rash Has patient had a PCN reaction causing severe rash involving mucus membranes or skin necrosis: no Has patient had a PCN reaction that required hospitalization: no Has patient had a PCN reaction occurring within the last 10 years: No If all of the above answers are "NO", then may proceed with Cephalosporin use.     HOME MEDICATIONS: Outpatient Medications Prior to Visit  Medication Sig Dispense Refill  . allopurinol (ZYLOPRIM) 100 MG tablet TAKE 1 TABLET BY MOUTH TWICE DAILY. 180 tablet 0  . fluticasone (FLONASE) 50 MCG/ACT nasal spray Place 1 spray into both nostrils daily.     Marland Kitchen latanoprost (XALATAN) 0.005 % ophthalmic solution Apply 1 drop to eye at bedtime.    . Multiple Vitamins-Minerals (MULTIVITAMIN WITH MINERALS) tablet Take 1 tablet by mouth daily.    Marland Kitchen omeprazole (PRILOSEC) 20 MG capsule TAKE (1) CAPSULE DAILY. 90 capsule 1  . verapamil (CALAN-SR) 180 MG CR tablet TAKE ONE TABLET AT BEDTIME. 90 tablet 0   No facility-administered medications prior to visit.     PAST MEDICAL HISTORY: Past Medical History:  Diagnosis Date  . Apnea, sleep   . Barrett's esophagus   . Gout   . Hypertension   .  Obesity     PAST SURGICAL HISTORY: Past Surgical History:  Procedure Laterality Date  . CHOLECYSTECTOMY  08/29/2017  . EYE SURGERY Bilateral 2011-2012   catracts removed  . HERNIA REPAIR  2005   T. Carlton Adam MD  . TONSILECTOMY/ADENOIDECTOMY WITH MYRINGOTOMY  1944    FAMILY HISTORY: Family History  Problem Relation Age of Onset  . Osteoporosis Mother   . Cancer Father 51       esophagus  . Crohn's disease Sister   . Birth defects Maternal Grandmother        colon  . Diabetes Paternal Grandmother     SOCIAL HISTORY: Social History   Socioeconomic History  . Marital  status: Married    Spouse name: Inez Catalina  . Number of children: 1  . Years of education: Not on file  . Highest education level: Not on file  Occupational History  . Not on file  Social Needs  . Financial resource strain: Not on file  . Food insecurity:    Worry: Not on file    Inability: Not on file  . Transportation needs:    Medical: Not on file    Non-medical: Not on file  Tobacco Use  . Smoking status: Former Smoker    Packs/day: 1.00    Years: 15.00    Pack years: 15.00  . Smokeless tobacco: Never Used  Substance and Sexual Activity  . Alcohol use: Yes    Alcohol/week: 1.8 - 2.4 oz    Types: 3 - 4 Standard drinks or equivalent per week    Comment: 2-3 beers a week  . Drug use: No  . Sexual activity: Not Currently  Lifestyle  . Physical activity:    Days per week: Not on file    Minutes per session: Not on file  . Stress: Not on file  Relationships  . Social connections:    Talks on phone: Not on file    Gets together: Not on file    Attends religious service: Not on file    Active member of club or organization: Not on file    Attends meetings of clubs or organizations: Not on file    Relationship status: Not on file  . Intimate partner violence:    Fear of current or ex partner: Not on file    Emotionally abused: Not on file    Physically abused: Not on file    Forced sexual activity: Not on file  Other Topics Concern  . Not on file  Social History Narrative   Social History     Marital status: Married ,1986            Spouse name: Inez Catalina                    Years of education:                 Number of children: 1                Occupational History: Teacher     None on file      Social History Main Topics     Smoking status:                       Smokeless tobacco: Not on file                        Alcohol use: 3-4 drinks per week  Drug use: Unknown     Sexual activity: Not on file            Other Topics            Concern     None on file       Social History Narrative     None on file               PHYSICAL EXAM  Vitals:   03/30/18 0839  BP: 116/66  Pulse: 79  Weight: 240 lb (108.9 kg)  Height: 5\' 11"  (1.803 m)   Body mass index is 33.47 kg/m.  Generalized: Well developed, in no acute distress   Neurological examination  Mentation: Alert oriented to time, place, history taking. Follows all commands speech and language fluent Cranial nerve II-XII: Pupils were equal round reactive to light. Extraocular movements were full, visual field were full on confrontational test. Facial sensation and strength were normal. Uvula tongue midline. Head turning and shoulder shrug  were normal and symmetric. Motor: The motor testing reveals 5 over 5 strength of all 4 extremities. Good symmetric motor tone is noted throughout.  Sensory: Sensory testing is intact to soft touch on all 4 extremities. No evidence of extinction is noted.  Coordination: Cerebellar testing reveals good finger-nose-finger and heel-to-shin bilaterally.  Gait and station: Gait is normal. Reflexes: Deep tendon reflexes are symmetric and normal bilaterally.   DIAGNOSTIC DATA (LABS, IMAGING, TESTING) - I reviewed patient records, labs, notes, testing and imaging myself where available.  Lab Results  Component Value Date   WBC 8.1 09/30/2017   HGB 14.4 09/30/2017   HCT 41.2 09/30/2017   MCV 85.3 09/30/2017   PLT 237 09/30/2017      Component Value Date/Time   NA 138 01/27/2018 0730   K 4.2 01/27/2018 0730   CL 106 01/27/2018 0730   CO2 25 01/27/2018 0730   GLUCOSE 100 (H) 01/27/2018 0730   BUN 16 01/27/2018 0730   CREATININE 1.13 (H) 01/27/2018 0730   CALCIUM 9.0 01/27/2018 0730   PROT 6.9 01/27/2018 0730   ALBUMIN 3.9 08/27/2017 0347   AST 19 01/27/2018 0730   ALT 16 01/27/2018 0730   ALKPHOS 86 08/27/2017 0347   BILITOT 0.8 01/27/2018 0730   GFRNONAA 61 01/27/2018 0730   GFRAA 70 01/27/2018 0730   Lab Results  Component Value Date    CHOL 196 01/27/2018   HDL 42 01/27/2018   LDLCALC 128 (H) 01/27/2018   TRIG 144 01/27/2018   CHOLHDL 4.7 01/27/2018   Lab Results  Component Value Date   HGBA1C 5.8 (H) 01/27/2018   No results found for: VITAMINB12 No results found for: TSH    ASSESSMENT AND PLAN 83 y.o. year old male  has a past medical history of Apnea, sleep, Barrett's esophagus, Gout, Hypertension, and Obesity. here with:  1.  Obstructive sleep apnea on BiPAP  The patient's download indicates good compliance however his residual AHI remains elevated and he has a significantly high leak.  He will go to our sleep lab today to have his mask refitted.  I will see him back in 3 months for a download.  If his apnea does not improve we will consider a titration.  Patient voiced understanding.  He will follow-up in 3 months or sooner if needed.  I spent 15 minutes with the patient. 50% of this time was spent reviewing sleep download   Ward Givens, MSN, NP-C 03/30/2018, 9:01 AM [facility] [address], [place], [place] [address] [phone]  I reviewed the above note and documentation by the Nurse Practitioner and agree with the history, physical exam, assessment and plan as outlined above. I was immediately available for face-to-face consultation. Star Age, MD, PhD [facility])

## 2018-03-30 NOTE — Patient Instructions (Signed)
Your Plan:  Continue using CPAP nightly High leak- will do a mask refitting today If your symptoms worsen or you develop new symptoms please let us know.   Thank you for coming to see [place] at [facility]. I hope we have been able to provide you high quality care today.  You may receive a patient satisfaction survey over the next few weeks. We would appreciate your feedback and comments so that we may continue to improve ourselves and the health of our patients.

## 2018-04-01 ENCOUNTER — Other Ambulatory Visit: Payer: Self-pay | Admitting: Nurse Practitioner

## 2018-04-29 ENCOUNTER — Other Ambulatory Visit: Payer: Self-pay | Admitting: Nurse Practitioner

## 2018-05-09 ENCOUNTER — Telehealth: Payer: Self-pay | Admitting: Nurse Practitioner

## 2018-05-09 NOTE — Telephone Encounter (Signed)
I spoke with the patient's wife, and they won't be available tomorrow afternoon.  She agreed that I can call back to schedule whenever Eric Chung will be there again. VDM (DD)

## 2018-05-30 ENCOUNTER — Other Ambulatory Visit: Payer: Self-pay | Admitting: Nurse Practitioner

## 2018-06-28 ENCOUNTER — Encounter: Payer: Self-pay | Admitting: Adult Health

## 2018-06-30 ENCOUNTER — Encounter: Payer: Self-pay | Admitting: Adult Health

## 2018-06-30 ENCOUNTER — Ambulatory Visit: Payer: Medicare Other | Admitting: Adult Health

## 2018-06-30 VITALS — BP 119/71 | HR 72 | Ht 71.0 in | Wt 248.2 lb

## 2018-06-30 DIAGNOSIS — G4733 Obstructive sleep apnea (adult) (pediatric): Secondary | ICD-10-CM

## 2018-06-30 NOTE — Progress Notes (Signed)
Community message sent via Epic re: chin strap and mask refitting.

## 2018-06-30 NOTE — Patient Instructions (Signed)
Your Plan:  Continue using bipap Mask refitting and chinstrap ordered  Thank you for coming to see [place] at [facility]. I hope we have been able to provide you high quality care today.  You may receive a patient satisfaction survey over the next few weeks. We would appreciate your feedback and comments so that we may continue to improve ourselves and the health of our patients.

## 2018-06-30 NOTE — Progress Notes (Addendum)
PATIENT: Eric Chung DOB: [birth year]  REASON FOR VISIT: follow up HISTORY FROM: patient  HISTORY OF PRESENT ILLNESS: Today 06/30/18:  Eric Chung is an 83 year old male with a history of obstructive sleep apnea on BiPAP.  He returns today for follow-up.  His download indicates that he uses machine every night for compliance of 100.  He is in his machine greater than 4 hours 28 out of 30 days for compliance of 93%.  On average he uses his machine 5 hours and 17 minutes.  His residual AHI is 17.2 on 16/12 cmh20.  His leak in the 95th percentile is 116.2 L/min.  The patient states that he is not taking his mask off at night.  He states that some mornings he wakes up and the mask has ridden up  to his mouth.  He has had his mask refitted in the past but no improvement in the wound.  He returns today for evaluation.  HISTORY Eric Chung is an 83 year old male with a history of obstructive sleep apnea on BiPAP he returns today for an evaluation.  His download indicates that he use his machine 30 out of 30 days for compliance of 100%.  He uses machine greater than 4 hours 23 days for compliance of 77%.  On average he uses his machine 4 hours and 44 minutes.  His residual AHI is 15.9 on 16/12 with respiratory rate of 12.  His leak  in the 95th percentile is 119.8 L/min.  The patient states that he does feel the mask leaking at night.  He returns today for evaluation.  REVIEW OF SYSTEMS: Out of a complete 14 system review of symptoms, the patient complains only of the following symptoms, and all other reviewed systems are negative.  Eye discharge, eye pain, ringing in ears, diarrhea, apnea, daytime sleepiness, snoring, memory loss  ALLERGIES: Allergies  Allergen Reactions  . Penicillins     Has patient had a PCN reaction causing immediate rash, facial/tongue/throat swelling, SOB or lightheadedness with hypotension: yes, rash Has patient had a PCN reaction causing severe rash involving mucus  membranes or skin necrosis: no Has patient had a PCN reaction that required hospitalization: no Has patient had a PCN reaction occurring within the last 10 years: No If all of the above answers are "NO", then may proceed with Cephalosporin use.     HOME MEDICATIONS: Outpatient Medications Prior to Visit  Medication Sig Dispense Refill  . allopurinol (ZYLOPRIM) 100 MG tablet TAKE 1 TABLET BY MOUTH TWICE DAILY. 180 tablet 1  . fluticasone (FLONASE) 50 MCG/ACT nasal spray Place 1 spray into both nostrils daily.     Marland Kitchen latanoprost (XALATAN) 0.005 % ophthalmic solution Apply 1 drop to eye at bedtime.    . Multiple Vitamins-Minerals (MULTIVITAMIN WITH MINERALS) tablet Take 1 tablet by mouth daily.    Marland Kitchen omeprazole (PRILOSEC) 20 MG capsule TAKE (1) CAPSULE DAILY. 90 capsule 0  . SUMAtriptan (IMITREX) 50 MG tablet Take 50 mg by mouth as needed.    . verapamil (CALAN-SR) 180 MG CR tablet TAKE ONE TABLET AT BEDTIME. 90 tablet 0   No facility-administered medications prior to visit.     PAST MEDICAL HISTORY: Past Medical History:  Diagnosis Date  . Apnea, sleep   . Barrett's esophagus   . Gout   . Hypertension   . Obesity     PAST SURGICAL HISTORY: Past Surgical History:  Procedure Laterality Date  . CHOLECYSTECTOMY  08/29/2017  . EYE SURGERY Bilateral 2011-2012  catracts removed  . HERNIA REPAIR  2005   T. Carlton Adam MD  . TONSILECTOMY/ADENOIDECTOMY WITH MYRINGOTOMY  1944    FAMILY HISTORY: Family History  Problem Relation Age of Onset  . Osteoporosis Mother   . Cancer Father 61       esophagus  . Crohn's disease Sister   . Birth defects Maternal Grandmother        colon  . Diabetes Paternal Grandmother     SOCIAL HISTORY: Social History   Socioeconomic History  . Marital status: Married    Spouse name: Inez Catalina  . Number of children: 1  . Years of education: Not on file  . Highest education level: Not on file  Occupational History  . Not on file  Social Needs  .  Financial resource strain: Not on file  . Food insecurity:    Worry: Not on file    Inability: Not on file  . Transportation needs:    Medical: Not on file    Non-medical: Not on file  Tobacco Use  . Smoking status: Former Smoker    Packs/day: 1.00    Years: 15.00    Pack years: 15.00  . Smokeless tobacco: Never Used  Substance and Sexual Activity  . Alcohol use: Yes    Alcohol/week: 3.0 - 4.0 standard drinks    Types: 3 - 4 Standard drinks or equivalent per week    Comment: 2-3 beers a week  . Drug use: No  . Sexual activity: Not Currently  Lifestyle  . Physical activity:    Days per week: Not on file    Minutes per session: Not on file  . Stress: Not on file  Relationships  . Social connections:    Talks on phone: Not on file    Gets together: Not on file    Attends religious service: Not on file    Active member of club or organization: Not on file    Attends meetings of clubs or organizations: Not on file    Relationship status: Not on file  . Intimate partner violence:    Fear of current or ex partner: Not on file    Emotionally abused: Not on file    Physically abused: Not on file    Forced sexual activity: Not on file  Other Topics Concern  . Not on file  Social History Narrative   Social History     Marital status: Married ,1986            Spouse name: Inez Catalina                    Years of education:                 Number of children: 1                Occupational History: Pharmacist, hospital     None on file      Social History Main Topics     Smoking status:                       Smokeless tobacco: Not on file                        Alcohol use: 3-4 drinks per week     Drug use: Unknown     Sexual activity: Not on file            Other Topics  Concern     None on file      Social History Narrative     None on file               PHYSICAL EXAM  Vitals:   06/30/18 1301  BP: 119/71  Pulse: 72  Weight: 248 lb 3.2 oz (112.6 kg)  Height: 5\' 11"   (1.803 m)   Body mass index is 34.62 kg/m.  Generalized: Well developed, in no acute distress   Neurological examination  Mentation: Alert oriented to time, place, history taking. Follows all commands speech and language fluent Cranial nerve II-XII: Facial sensation and strength were normal. Uvula tongue midline. Head turning and shoulder shrug  were normal and symmetric. Motor: The motor testing reveals 5 over 5 strength of all 4 extremities. Good symmetric motor tone is noted throughout.  Sensory: Sensory testing is intact to soft touch on all 4 extremities. No evidence of extinction is noted.  Gait and station: Gait is normal.    DIAGNOSTIC DATA (LABS, IMAGING, TESTING) - I reviewed patient records, labs, notes, testing and imaging myself where available.  Lab Results  Component Value Date   WBC 8.1 09/30/2017   HGB 14.4 09/30/2017   HCT 41.2 09/30/2017   MCV 85.3 09/30/2017   PLT 237 09/30/2017      Component Value Date/Time   NA 138 01/27/2018 0730   K 4.2 01/27/2018 0730   CL 106 01/27/2018 0730   CO2 25 01/27/2018 0730   GLUCOSE 100 (H) 01/27/2018 0730   BUN 16 01/27/2018 0730   CREATININE 1.13 (H) 01/27/2018 0730   CALCIUM 9.0 01/27/2018 0730   PROT 6.9 01/27/2018 0730   ALBUMIN 3.9 08/27/2017 0347   AST 19 01/27/2018 0730   ALT 16 01/27/2018 0730   ALKPHOS 86 08/27/2017 0347   BILITOT 0.8 01/27/2018 0730   GFRNONAA 61 01/27/2018 0730   GFRAA 70 01/27/2018 0730   Lab Results  Component Value Date   CHOL 196 01/27/2018   HDL 42 01/27/2018   LDLCALC 128 (H) 01/27/2018   TRIG 144 01/27/2018   CHOLHDL 4.7 01/27/2018   Lab Results  Component Value Date   HGBA1C 5.8 (H) 01/27/2018      ASSESSMENT AND PLAN 83 y.o. year old male  has a past medical history of Apnea, sleep, Barrett's esophagus, Gout, Hypertension, and Obesity. here with:  1.  Obstructive sleep apnea on BiPAP  The patient's download indicates that he has good compliance but still has a  high leak.  I have recommended a chinstrap and possibly to  have the mask refitted again with his DME company.  An order was sent to his DME company he will follow-up in 6 months or sooner if needed.   I spent 15 minutes with the patient. 50% of this time was spent reviewing download   Ward Givens, MSN, NP-C 06/30/2018, 1:13 PM [facility] [address] [place], [place] [address] [phone]  I reviewed the above note and documentation by the Nurse Practitioner and agree with the history, physical exam, assessment and plan as outlined above.  Star Age, MD, PhD [facility])

## 2018-07-11 ENCOUNTER — Non-Acute Institutional Stay: Payer: Medicare Other

## 2018-07-11 VITALS — BP 140/60 | HR 71 | Temp 98.0°F | Ht 71.0 in | Wt 248.0 lb

## 2018-07-11 DIAGNOSIS — Z0000 Encounter for general adult medical examination without abnormal findings: Secondary | ICD-10-CM | POA: Diagnosis not present

## 2018-07-11 MED ORDER — ZOSTER VAC RECOMB ADJUVANTED 50 MCG/0.5ML IM SUSR: 0.5000 mL | Freq: Once | INTRAMUSCULAR | 1 refills | Status: AC

## 2018-07-11 NOTE — Patient Instructions (Addendum)
Eric Chung , Thank you for taking time to come for your Medicare Wellness Visit. I appreciate your ongoing commitment to your health goals. Please review the following plan we discussed and let me know if I can assist you in the future.   Screening recommendations/referrals: Colonoscopy excluded, over age 81 Recommended yearly ophthalmology/optometry visit for glaucoma screening and checkup Recommended yearly dental visit for hygiene and checkup  Vaccinations: Influenza vaccine up to date Pneumococcal vaccine -- please check when/if you ever got Prevnar (13) or Pneumovax (23) Tdap vaccine up to date, due 07/31/2026 Shingles vaccine due, ordered to [facility]    Advanced directives: in chart  Conditions/risks identified: none  Next appointment: Mast 07/25/18 @ 1:30pm  Preventive Care 17 Years and Older, Male Preventive care refers to lifestyle choices and visits with your health care provider that can promote health and wellness. What does preventive care include?  A yearly physical exam. This is also called an annual well check.  Dental exams once or twice a year.  Routine eye exams. Ask your health care provider how often you should have your eyes checked.  Personal lifestyle choices, including:  Daily care of your teeth and gums.  Regular physical activity.  Eating a healthy diet.  Avoiding tobacco and drug use.  Limiting alcohol use.  Practicing safe sex.  Taking low doses of aspirin every day.  Taking vitamin and mineral supplements as recommended by your health care provider. What happens during an annual well check? The services and screenings done by your health care provider during your annual well check will depend on your age, overall health, lifestyle risk factors, and family history of disease. Counseling  Your health care provider may ask you questions about your:  Alcohol use.  Tobacco use.  Drug use.  Emotional well-being.  Home and relationship  well-being.  Sexual activity.  Eating habits.  History of falls.  Memory and ability to understand (cognition).  Work and work Statistician. Screening  You may have the following tests or measurements:  Height, weight, and BMI.  Blood pressure.  Lipid and cholesterol levels. These may be checked every 5 years, or more frequently if you are over 50 years old.  Skin check.  Lung cancer screening. You may have this screening every year starting at age 70 if you have a 30-pack-year history of smoking and currently smoke or have quit within the past 15 years.  Fecal occult blood test (FOBT) of the stool. You may have this test every year starting at age 73.  Flexible sigmoidoscopy or colonoscopy. You may have a sigmoidoscopy every 5 years or a colonoscopy every 10 years starting at age 79.  Prostate cancer screening. Recommendations will vary depending on your family history and other risks.  Hepatitis C blood test.  Hepatitis B blood test.  Sexually transmitted disease (STD) testing.  Diabetes screening. This is done by checking your blood sugar (glucose) after you have not eaten for a while (fasting). You may have this done every 1-3 years.  Abdominal aortic aneurysm (AAA) screening. You may need this if you are a current or former smoker.  Osteoporosis. You may be screened starting at age 31 if you are at high risk. Talk with your health care provider about your test results, treatment options, and if necessary, the need for more tests. Vaccines  Your health care provider may recommend certain vaccines, such as:  Influenza vaccine. This is recommended every year.  Tetanus, diphtheria, and acellular pertussis (Tdap, Td) vaccine.  You may need a Td booster every 10 years.  Zoster vaccine. You may need this after age 85.  Pneumococcal 13-valent conjugate (PCV13) vaccine. One dose is recommended after age 75.  Pneumococcal polysaccharide (PPSV23) vaccine. One dose is  recommended after age 44. Talk to your health care provider about which screenings and vaccines you need and how often you need them. This information is not intended to replace advice given to you by your health care provider. Make sure you discuss any questions you have with your health care provider. Document Released: 09/06/2015 Document Revised: 04/29/2016 Document Reviewed: 06/11/2015 Elsevier Interactive Patient Education  2017 [place] Prevention in the Home Falls can cause injuries. They can happen to people of all ages. There are many things you can do to make your home safe and to help prevent falls. What can I do on the outside of my home?  Regularly fix the edges of walkways and driveways and fix any cracks.  Remove anything that might make you trip as you walk through a door, such as a raised step or threshold.  Trim any bushes or trees on the path to your home.  Use bright outdoor lighting.  Clear any walking paths of anything that might make someone trip, such as rocks or tools.  Regularly check to see if handrails are loose or broken. Make sure that both sides of any steps have handrails.  Any raised decks and porches should have guardrails on the edges.  Have any leaves, snow, or ice cleared regularly.  Use sand or salt on walking paths during winter.  Clean up any spills in your garage right away. This includes oil or grease spills. What can I do in the bathroom?  Use night lights.  Install grab bars by the toilet and in the tub and shower. Do not use towel bars as grab bars.  Use non-skid mats or decals in the tub or shower.  If you need to sit down in the shower, use a plastic, non-slip stool.  Keep the floor dry. Clean up any water that spills on the floor as soon as it happens.  Remove soap buildup in the tub or shower regularly.  Attach bath mats securely with double-sided non-slip rug tape.  Do not have throw rugs and other things on  the floor that can make you trip. What can I do in the bedroom?  Use night lights.  Make sure that you have a light by your bed that is easy to reach.  Do not use any sheets or blankets that are too big for your bed. They should not hang down onto the floor.  Have a firm chair that has side arms. You can use this for support while you get dressed.  Do not have throw rugs and other things on the floor that can make you trip. What can I do in the kitchen?  Clean up any spills right away.  Avoid walking on wet floors.  Keep items that you use a lot in easy-to-reach places.  If you need to reach something above you, use a strong step stool that has a grab bar.  Keep electrical cords out of the way.  Do not use floor polish or wax that makes floors slippery. If you must use wax, use non-skid floor wax.  Do not have throw rugs and other things on the floor that can make you trip. What can I do with my stairs?  Do not leave any  items on the stairs.  Make sure that there are handrails on both sides of the stairs and use them. Fix handrails that are broken or loose. Make sure that handrails are as long as the stairways.  Check any carpeting to make sure that it is firmly attached to the stairs. Fix any carpet that is loose or worn.  Avoid having throw rugs at the top or bottom of the stairs. If you do have throw rugs, attach them to the floor with carpet tape.  Make sure that you have a light switch at the top of the stairs and the bottom of the stairs. If you do not have them, ask someone to add them for you. What else can I do to help prevent falls?  Wear shoes that:  Do not have high heels.  Have rubber bottoms.  Are comfortable and fit you well.  Are closed at the toe. Do not wear sandals.  If you use a stepladder:  Make sure that it is fully opened. Do not climb a closed stepladder.  Make sure that both sides of the stepladder are locked into place.  Ask someone to  hold it for you, if possible.  Clearly mark and make sure that you can see:  Any grab bars or handrails.  First and last steps.  Where the edge of each step is.  Use tools that help you move around (mobility aids) if they are needed. These include:  Canes.  Walkers.  Scooters.  Crutches.  Turn on the lights when you go into a dark area. Replace any light bulbs as soon as they burn out.  Set up your furniture so you have a clear path. Avoid moving your furniture around.  If any of your floors are uneven, fix them.  If there are any pets around you, be aware of where they are.  Review your medicines with your doctor. Some medicines can make you feel dizzy. This can increase your chance of falling. Ask your doctor what other things that you can do to help prevent falls. This information is not intended to replace advice given to you by your health care provider. Make sure you discuss any questions you have with your health care provider. Document Released: 06/06/2009 Document Revised: 01/16/2016 Document Reviewed: 09/14/2014 Elsevier Interactive Patient Education  2017 [employer].

## 2018-07-11 NOTE — Progress Notes (Signed)
Subjective:   Eric Chung is a 83 y.o. male who presents for Medicare Annual/Subsequent preventive examination at [facility]  Last AWV-05/27/2017    Objective:    Vitals: BP 140/60 (BP Location: Left Arm, Patient Position: Sitting)   Pulse 71   Temp 98 F (36.7 C) (Oral)   Ht 5\' 11"  (1.803 m)   Wt 248 lb (112.5 kg)   SpO2 95%   BMI 34.59 kg/m   Body mass index is 34.59 kg/m.  Advanced Directives 07/11/2018 08/26/2017 07/12/2017 05/27/2017 04/15/2017  Does Patient Have a Medical Advance Directive? Yes Yes Yes Yes Yes  Type of Paramedic of [place];Living will - [place];Living will [place];Living will [place];Living will  Does patient want to make changes to medical advance directive? No - Patient declined No - Patient declined No - Patient declined No - Patient declined No - Patient declined  Copy of [place] in Chart? Yes - validated most recent copy scanned in chart (See row information) - - Yes Yes    Tobacco Social History   Tobacco Use  Smoking Status Former Smoker  . Packs/day: 1.00  . Years: 15.00  . Pack years: 15.00  Smokeless Tobacco Never Used     Counseling given: Not Answered   Clinical Intake:  Pre-visit preparation completed: No  Pain : No/denies pain     Diabetes: No  How often do you need to have someone help you when you read instructions, pamphlets, or other written materials from your doctor or pharmacy?: 1 - Never What is the last grade level you completed in school?: graduate  Interpreter Needed?: No  Information entered by :: Tyson Dense, RN  Past Medical History:  Diagnosis Date  . Apnea, sleep   . Barrett's esophagus   . Gout   . Hypertension   . Obesity    Past Surgical History:  Procedure Laterality Date  . CHOLECYSTECTOMY  08/29/2017  . EYE SURGERY Bilateral 2011-2012   catracts removed  . HERNIA  REPAIR  2005   T. Carlton Adam MD  . TONSILECTOMY/ADENOIDECTOMY WITH MYRINGOTOMY  1944   Family History  Problem Relation Age of Onset  . Osteoporosis Mother   . Cancer Father 45       esophagus  . Crohn's disease Sister   . Birth defects Maternal Grandmother        colon  . Diabetes Paternal Grandmother    Social History   Socioeconomic History  . Marital status: Married    Spouse name: Inez Catalina  . Number of children: 1  . Years of education: Not on file  . Highest education level: Not on file  Occupational History  . Not on file  Social Needs  . Financial resource strain: Not hard at all  . Food insecurity:    Worry: Never true    Inability: Never true  . Transportation needs:    Medical: No    Non-medical: No  Tobacco Use  . Smoking status: Former Smoker    Packs/day: 1.00    Years: 15.00    Pack years: 15.00  . Smokeless tobacco: Never Used  Substance and Sexual Activity  . Alcohol use: Yes    Alcohol/week: 3.0 - 4.0 standard drinks    Types: 3 - 4 Standard drinks or equivalent per week    Comment: 2-3 beers a week  . Drug use: No  . Sexual activity: Not Currently  Lifestyle  .  Physical activity:    Days per week: 0 days    Minutes per session: 0 min  . Stress: Only a little  Relationships  . Social connections:    Talks on phone: More than three times a week    Gets together: More than three times a week    Attends religious service: More than 4 times per year    Active member of club or organization: Yes    Attends meetings of clubs or organizations: More than 4 times per year    Relationship status: Married  Other Topics Concern  . Not on file  Social History Narrative   Social History     Marital status: Married ,1986            Spouse name: Inez Catalina                    Years of education:                 Number of children: 1                Occupational History: Teacher     None on file      Social History Main Topics     Smoking status:                        Smokeless tobacco: Not on file                        Alcohol use: 3-4 drinks per week     Drug use: Unknown     Sexual activity: Not on file            Other Topics            Concern     None on file      Social History Narrative     None on file             Outpatient Encounter Medications as of 07/11/2018  Medication Sig  . allopurinol (ZYLOPRIM) 100 MG tablet TAKE 1 TABLET BY MOUTH TWICE DAILY.  . fluticasone (FLONASE) 50 MCG/ACT nasal spray Place 1 spray into both nostrils daily.   . Multiple Vitamins-Minerals (MULTIVITAMIN WITH MINERALS) tablet Take 1 tablet by mouth daily.  Marland Kitchen omeprazole (PRILOSEC) 20 MG capsule TAKE (1) CAPSULE DAILY.  . SUMAtriptan (IMITREX) 50 MG tablet Take 50 mg by mouth as needed.  . verapamil (CALAN-SR) 180 MG CR tablet TAKE ONE TABLET AT BEDTIME.   No facility-administered encounter medications on file as of 07/11/2018.     Activities of Daily Living In your present state of health, do you have any difficulty performing the following activities: 07/11/2018  Hearing? N  Vision? N  Difficulty concentrating or making decisions? N  Walking or climbing stairs? N  Dressing or bathing? N  Doing errands, shopping? N  Preparing Food and eating ? N  Using the Toilet? N  In the past six months, have you accidently leaked urine? N  Do you have problems with loss of bowel control? N  Managing your Medications? N  Managing your Finances? N  Housekeeping or managing your Housekeeping? N  Some recent data might be hidden    Patient Care Team: Mast, Man X, NP as PCP - General (Internal Medicine)   Assessment:   This is a routine wellness examination for [facility].  Exercise Activities and Dietary recommendations Current Exercise  Habits: The patient does not participate in regular exercise at present, Exercise limited by: None identified  Goals    . Exercise 150 minutes per week (moderate activity)     Patient will walk in the mornings 15-30  minutes       Fall Risk Fall Risk  07/11/2018 07/12/2017 05/27/2017  Falls in the past year? 0 Yes Yes  Number falls in past yr: 0 1 1  Comment - moving panio and mis-stepped backwards -  Injury with Fall? 0 Yes No  Comment - elbow scrape became infected -   Is the patient's home free of loose throw rugs in walkways, pet beds, electrical cords, etc?   yes      Grab bars in the bathroom? yes      Handrails on the stairs?   yes      Adequate lighting?   yes  Depression Screen PHQ 2/9 Scores 07/11/2018 05/27/2017  PHQ - 2 Score 0 0    Cognitive Function MMSE - Mini Mental State Exam 07/11/2018 05/27/2017  Orientation to time 5 4  Orientation to Place 5 5  Registration 3 3  Attention/ Calculation 5 5  Recall 3 3  Language- name 2 objects 2 2  Language- repeat 1 1  Language- follow 3 step command 3 2  Language- read & follow direction 1 1  Write a sentence 1 1  Copy design 1 1  Total score 30 28        Immunization History  Administered Date(s) Administered  . Influenza Whole 05/26/2018  . Influenza, High Dose Seasonal PF 06/02/2017  . Influenza-Unspecified 06/24/2016  . Pneumococcal Conjugate-13 09/12/2014  . Pneumococcal Polysaccharide-23 09/12/2014  . Td 07/31/2016  . Zoster Recombinat (Shingrix) 08/24/2005    Qualifies for Shingles Vaccine? Yes, educated and ordered to pharmacy  Screening Tests Health Maintenance  Topic Date Due  . PNA vac Low Risk Adult (2 of 2 - PPSV23) 09/13/2015  . TETANUS/TDAP  07/31/2026  . INFLUENZA VACCINE  Completed   Cancer Screenings: Lung: Low Dose CT Chest recommended if Age 17-80 years, 30 pack-year currently smoking OR have quit w/in 15years. Patient does not qualify. Colorectal: up to date  Additional Screenings:  Hepatitis C Screening:decline Pneumonia vaccine possibly due- will check records      Plan:    I have personally reviewed and addressed the Medicare Annual Wellness questionnaire and have noted the following  in the patient's chart:  A. Medical and social history B. Use of alcohol, tobacco or illicit drugs  C. Current medications and supplements D. Functional ability and status E.  Nutritional status F.  Physical activity G. Advance directives H. List of other physicians I.  Hospitalizations, surgeries, and ER visits in previous 12 months J.  [place] to include hearing, vision, cognitive, depression L. Referrals and appointments - none  In addition, I have reviewed and discussed with patient certain preventive protocols, quality metrics, and best practice recommendations. A written personalized care plan for preventive services as well as general preventive health recommendations were provided to patient.  See attached scanned questionnaire for additional information.   Signed,   Tyson Dense, RN Nurse Health Advisor  Patient Concerns: None

## 2018-07-12 ENCOUNTER — Telehealth: Payer: Self-pay | Admitting: Adult Health

## 2018-07-12 NOTE — Telephone Encounter (Signed)
Community message sent to [facility] to check on status of patient's BiPAP orders placed on 06/30/18.

## 2018-07-12 NOTE — Telephone Encounter (Signed)
Patient has not heard from the supplier regarding CPAP supplies.

## 2018-07-13 NOTE — Telephone Encounter (Signed)
Received this reply from [place]/[facility] : This was given to one of our CPAP tech. I have emailed them to follow up with patient.

## 2018-07-29 ENCOUNTER — Other Ambulatory Visit: Payer: Self-pay | Admitting: Nurse Practitioner

## 2018-08-04 ENCOUNTER — Non-Acute Institutional Stay: Payer: Medicare Other | Admitting: Nurse Practitioner

## 2018-08-04 ENCOUNTER — Encounter: Payer: Self-pay | Admitting: Nurse Practitioner

## 2018-08-04 DIAGNOSIS — G43109 Migraine with aura, not intractable, without status migrainosus: Secondary | ICD-10-CM | POA: Diagnosis not present

## 2018-08-04 DIAGNOSIS — K2270 Barrett's esophagus without dysplasia: Secondary | ICD-10-CM

## 2018-08-04 DIAGNOSIS — M10071 Idiopathic gout, right ankle and foot: Secondary | ICD-10-CM | POA: Diagnosis not present

## 2018-08-04 DIAGNOSIS — R609 Edema, unspecified: Secondary | ICD-10-CM

## 2018-08-04 DIAGNOSIS — I10 Essential (primary) hypertension: Secondary | ICD-10-CM

## 2018-08-04 DIAGNOSIS — M25562 Pain in left knee: Secondary | ICD-10-CM

## 2018-08-04 NOTE — Assessment & Plan Note (Signed)
Chronic trace edema BLE remains no change.

## 2018-08-04 NOTE — Assessment & Plan Note (Signed)
Ortho referral. Activity as tolerated. BioFreeze qid. Observe.

## 2018-08-04 NOTE — Assessment & Plan Note (Signed)
Stable, continue Omeprazole 20mg qd.  

## 2018-08-04 NOTE — Assessment & Plan Note (Signed)
Controlled blood pressure, continue Verapamil 180mg  qd.

## 2018-08-04 NOTE — Patient Instructions (Addendum)
Ortho referral for the left knee pain. Apply BioFreeze qid to the left knee. Activity as tolerated. F/u in clinic [facility] 6 months. Update CBC CMP lipid panel TSH Hgba1c prior to the next appointment.

## 2018-08-04 NOTE — Progress Notes (Signed)
Location:   clinic [facility]   Place of Service:  Clinic (12)clinic [facility] Provider: Marlana Latus NP  Code Status: DNR Goals of Care: [place] 07/11/2018  Does Patient Have a Medical Advance Directive? Yes  Type of Paramedic of [place];Living will  Does patient want to make changes to medical advance directive? No - Patient declined  Copy of [place] in Chart? Yes - validated most recent copy scanned in chart (See row information)     Chief Complaint  Patient presents with  . Medical Management of Chronic Issues    6 mo f/u- (L) knee hurts when he walks,     HPI: Patient is a 83 y.o. male seen today for medical management of chronic diseases.    The left  knee pain about a month, denied injury, past joint inj/fx 40 years ago,  reproduced when walking or bearing weight. No noted warmth, swelling, deformity, redness, or pain palpated.   The patient has history of Afib, heart rate is in control, on Verapamil 180mg  qd. Migraine headache is managed on Imitrex 50mg  qd. GERD stable on Omeprazole 20mg  qd. No gouty attacks since last visited, on Allopurinol 100mg  qd.  Past Medical History:  Diagnosis Date  . Apnea, sleep   . Barrett's esophagus   . Gout   . Hypertension   . Obesity     Past Surgical History:  Procedure Laterality Date  . CHOLECYSTECTOMY  08/29/2017  . EYE SURGERY Bilateral 2011-2012   catracts removed  . HERNIA REPAIR  2005   T. Carlton Adam MD  . TONSILECTOMY/ADENOIDECTOMY WITH MYRINGOTOMY  0086    Allergies  Allergen Reactions  . Penicillins     Has patient had a PCN reaction causing immediate rash, facial/tongue/throat swelling, SOB or lightheadedness with hypotension: yes, rash Has patient had a PCN reaction causing severe rash involving mucus membranes or skin necrosis: no Has patient had a PCN reaction that required hospitalization: no Has patient had a PCN reaction occurring within the last 10 years:  No If all of the above answers are "NO", then may proceed with Cephalosporin use.     Allergies as of 08/04/2018      Reactions   Penicillins    Has patient had a PCN reaction causing immediate rash, facial/tongue/throat swelling, SOB or lightheadedness with hypotension: yes, rash Has patient had a PCN reaction causing severe rash involving mucus membranes or skin necrosis: no Has patient had a PCN reaction that required hospitalization: no Has patient had a PCN reaction occurring within the last 10 years: No If all of the above answers are "NO", then may proceed with Cephalosporin use.      Medication List       Accurate as of August 04, 2018 11:59 PM. Always use your most recent med list.        allopurinol 100 MG tablet Commonly known as:  ZYLOPRIM TAKE 1 TABLET BY MOUTH TWICE DAILY.   fluticasone 50 MCG/ACT nasal spray Commonly known as:  FLONASE Place 1 spray into both nostrils daily.   latanoprost 0.005 % ophthalmic solution Commonly known as:  XALATAN Place 1 drop into both eyes at bedtime.   multivitamin with minerals tablet Take 1 tablet by mouth daily.   omeprazole 20 MG capsule Commonly known as:  PRILOSEC TAKE (1) CAPSULE DAILY.   SUMAtriptan 50 MG tablet Commonly known as:  IMITREX Take 50 mg by mouth as needed.   verapamil 180 MG CR tablet  Commonly known as:  CALAN-SR TAKE ONE TABLET AT BEDTIME.       Review of Systems:  Review of Systems  Constitutional: Negative for activity change, appetite change, chills, diaphoresis, fatigue and fever.  HENT: Positive for hearing loss. Negative for congestion and voice change.   Respiratory: Positive for cough. Negative for chest tightness, shortness of breath and wheezing.        Chronic hacking cough  Cardiovascular: Positive for leg swelling. Negative for chest pain and palpitations.  Gastrointestinal: Negative for abdominal distention, abdominal pain, constipation, diarrhea and nausea.   Genitourinary: Negative for difficulty urinating, dysuria and urgency.  Musculoskeletal: Positive for arthralgias and gait problem.       Left knee pain  Skin: Negative for color change and pallor.  Neurological: Negative for dizziness, speech difficulty, weakness and headaches.  Psychiatric/Behavioral: Negative for agitation, behavioral problems, hallucinations and sleep disturbance. The patient is not nervous/anxious.     Health Maintenance  Topic Date Due  . TETANUS/TDAP  07/31/2026  . INFLUENZA VACCINE  Completed  . PNA vac Low Risk Adult  Completed    Physical Exam: Vitals:   08/04/18 1334  BP: 130/70  Pulse: 84  Resp: 20  Temp: 97.6 F (36.4 C)  TempSrc: Oral  SpO2: 95%  Weight: 255 lb 6.4 oz (115.8 kg)  Height: 5\' 11"  (1.803 m)   Body mass index is 35.62 kg/m. Physical Exam Constitutional:      Appearance: Normal appearance.  HENT:     Head: Normocephalic and atraumatic.     Nose: Nose normal. No congestion.     Mouth/Throat:     Mouth: Mucous membranes are moist.  Eyes:     Extraocular Movements: Extraocular movements intact.     Pupils: Pupils are equal, round, and reactive to light.  Neck:     Musculoskeletal: Normal range of motion and neck supple.  Cardiovascular:     Rate and Rhythm: Normal rate and regular rhythm.     Heart sounds: No murmur.  Pulmonary:     Effort: Pulmonary effort is normal.     Breath sounds: Rales present.     Comments: bibasilar Abdominal:     General: Abdomen is flat. There is no distension.     Palpations: Abdomen is soft.     Tenderness: There is no abdominal tenderness. There is no guarding or rebound.  Musculoskeletal:        General: Swelling and tenderness present. No deformity or signs of injury.     Comments: Trace edema BLE. Left knee pain, it seems slightly bigger than the right knee. No warmth, redness, or swelling noted. Negative ballottment.   Skin:    General: Skin is warm and dry.  Neurological:      General: No focal deficit present.     Mental Status: He is alert and oriented to person, place, and time. Mental status is at baseline.     Cranial Nerves: No cranial nerve deficit.     Coordination: Coordination normal.     Gait: Gait normal.  Psychiatric:        Mood and Affect: Mood normal.        Behavior: Behavior normal.     Labs reviewed: Basic Metabolic Panel: Recent Labs    08/27/17 0347 09/30/17 0700 01/27/18 0730  NA 135 138 138  K 4.5 4.1 4.2  CL 104 104 106  CO2 21* 25 25  GLUCOSE 127* 95 100*  BUN 12 16 16   CREATININE 1.21  1.09 1.13*  CALCIUM 9.5 9.1 9.0   Liver Function Tests: Recent Labs    08/27/17 0347 09/30/17 0700 01/27/18 0730  AST 35 16 19  ALT 18 14 16   ALKPHOS 86  --   --   BILITOT 1.5* 0.8 0.8  PROT 7.7 6.8 6.9  ALBUMIN 3.9  --   --    Recent Labs    08/27/17 0347  LIPASE 28   No results for input(s): AMMONIA in the last 8760 hours. CBC: Recent Labs    08/26/17 1735 08/27/17 0347 09/30/17 0700  WBC 8.1 11.5* 8.1  NEUTROABS  --  7.4  --   HGB 14.2 15.1 14.4  HCT 42.1 44.5 41.2  MCV 89.2 89.0 85.3  PLT 221 238 237   Lipid Panel: Recent Labs    09/30/17 0700 01/27/18 0730  CHOL 212* 196  HDL 45 42  LDLCALC 142* 128*  TRIG 126 144  CHOLHDL 4.7 4.7   Lab Results  Component Value Date   HGBA1C 5.8 (H) 01/27/2018    Procedures since last visit: No results found.  Assessment/Plan  Hypertension Controlled blood pressure, continue Verapamil 180mg  qd.   Migraine headache with aura Stable, continue Imitrex 50mg  qd.   Barrett's esophagus Stable, continue Omeprazole 20mg  qd.   Gout of ankle Stable, continue Allopurinol 100mg  qd.   Edema Chronic trace edema BLE remains no change.   Left knee pain Ortho referral. Activity as tolerated. BioFreeze qid. Observe.     Labs/tests ordered: CBC CMP lipid panel TSH Hgb a1c Next appt:  6 months

## 2018-08-04 NOTE — Assessment & Plan Note (Signed)
Stable, continue Allopurinol 100mg qd  

## 2018-08-04 NOTE — Assessment & Plan Note (Signed)
Stable, continue Imitrex 50mg  qd.

## 2018-08-05 ENCOUNTER — Encounter: Payer: Self-pay | Admitting: Nurse Practitioner

## 2018-08-10 ENCOUNTER — Ambulatory Visit (INDEPENDENT_AMBULATORY_CARE_PROVIDER_SITE_OTHER): Payer: Self-pay

## 2018-08-10 ENCOUNTER — Encounter (INDEPENDENT_AMBULATORY_CARE_PROVIDER_SITE_OTHER): Payer: Self-pay | Admitting: Orthopaedic Surgery

## 2018-08-10 ENCOUNTER — Ambulatory Visit (INDEPENDENT_AMBULATORY_CARE_PROVIDER_SITE_OTHER): Payer: Medicare Other | Admitting: Orthopaedic Surgery

## 2018-08-10 VITALS — BP 117/77 | HR 81 | Ht 71.0 in | Wt 240.0 lb

## 2018-08-10 DIAGNOSIS — M25562 Pain in left knee: Secondary | ICD-10-CM

## 2018-08-10 NOTE — Progress Notes (Signed)
Office Visit Note   Patient: Eric Chung           Date of Birth: [birth year]           MRN: [id number] Visit Date: 08/10/2018              Requested by: Mast, Man X, NP [address], [place] [id number] PCP: Mast, Man X, NP   Assessment & Plan: Visit Diagnoses:  1. Acute pain of left knee     Plan: Moderate osteoarthritis left knee.  Presently comfortable.  Long discussion regarding future treatments including cortisone bracing anti-inflammatory medicines.  We will continue with exercises and over-the-counter medicines  Follow-Up Instructions: Return if symptoms worsen or fail to improve.   Orders:  Orders Placed This Encounter  Procedures  . XR KNEE 3 VIEW LEFT   No orders of the defined types were placed in this encounter.     Procedures: No procedures performed   Clinical Data: No additional findings.   Subjective: Chief Complaint  Patient presents with  . Left Knee - Pain  . Knee Pain    having pain when walking, started have the pain about 1 month ago, pain is around the the knee cap, hurt when putting weight on it   Eric Chung experienced insidious onset of left knee pain approximately a month ago.  No history of injury or trauma.  He has had a little bit of swelling but no feeling of instability.  Pain is mostly along the medial compartment.  He notes that he is feeling better since he made the appointment but just wanted to come in to talk about his knee and what he may expect over time.  No numbness or tingling.  Not having any back issues or problems with either hip.  HPI  Review of Systems  Cardiovascular: Positive for leg swelling.  Musculoskeletal: Positive for joint swelling.     Objective: Vital Signs: BP 117/77   Pulse 81   Ht 5\' 11"  (1.803 m)   Wt 240 lb (108.9 kg)   BMI 33.47 kg/m   Physical Exam Constitutional:      Appearance: He is well-developed.  Eyes:     Pupils: Pupils are equal, round, and reactive to light.    Pulmonary:     Effort: Pulmonary effort is normal.  Skin:    General: Skin is warm and dry.  Neurological:     Mental Status: He is alert and oriented to person, place, and time.  Psychiatric:        Behavior: Behavior normal.     Ortho Exam awake alert and oriented x3.  Comfortable sitting.  Left knee was not hot or warm.  Very small effusion.  Mild medial joint pain.  Full extension flexion over 105 degrees without instability.  No lateral joint pain.  No pain with patellar compression.  No distal edema.  Neurovascular exam intact  Specialty Comments:  No specialty comments available.  Imaging: Xr Knee 3 View Left  Result Date: 08/10/2018 Films of the left knee were obtained in several projections.  There is narrowing of the medial joint space associated with subchondral sclerosis beneath the medial femoral condyle and tibial plateau.  No ectopic calcification.  Alignment appears to be normal.  Some mild lateral patella tilt with small osteophytes consistent with mild to moderate osteoarthritis    PMFS History: Patient Active Problem List   Diagnosis Date Noted  . Left knee pain 08/04/2018  . Glaucoma 02/03/2018  .  Prediabetes 02/03/2018  . Stage 2 chronic kidney disease due to benign hypertension 01/31/2018  . Serrated adenoma of colon 10/29/2017  . Obesity (BMI 30-39.9) 10/26/2017  . Upper back pain on right side 09/30/2017  . Fall 09/30/2017  . Hyperlipidemia 09/30/2017  . S/P laparoscopic cholecystectomy 09/02/2017  . Ventral hernia, recurrent 04/15/2017  . Edema 04/15/2017  . Gout of ankle 04/15/2017  . Hypertension 04/15/2017  . Barrett's esophagus 04/15/2017  . Migraine headache with aura 04/15/2017  . Urinary frequency 04/15/2017  . Sleep apnea in adult 04/15/2017   Past Medical History:  Diagnosis Date  . Apnea, sleep   . Barrett's esophagus   . Gout   . Hypertension   . Obesity     Family History  Problem Relation Age of Onset  . Osteoporosis  Mother   . Cancer Father 62       esophagus  . Crohn's disease Sister   . Birth defects Maternal Grandmother        colon  . Diabetes Paternal Grandmother     Past Surgical History:  Procedure Laterality Date  . CHOLECYSTECTOMY  08/29/2017  . EYE SURGERY Bilateral 2011-2012   catracts removed  . HERNIA REPAIR  2005   T. Carlton Adam MD  . TONSILECTOMY/ADENOIDECTOMY WITH MYRINGOTOMY  1944   Social History   Occupational History  . Not on file  Tobacco Use  . Smoking status: Former Smoker    Packs/day: 1.00    Years: 15.00    Pack years: 15.00  . Smokeless tobacco: Never Used  Substance and Sexual Activity  . Alcohol use: Yes    Alcohol/week: 3.0 - 4.0 standard drinks    Types: 3 - 4 Standard drinks or equivalent per week    Comment: 2-3 beers a week  . Drug use: No  . Sexual activity: Not Currently

## 2018-08-29 ENCOUNTER — Other Ambulatory Visit: Payer: Self-pay | Admitting: Nurse Practitioner

## 2018-09-13 ENCOUNTER — Ambulatory Visit: Payer: Medicare Other | Admitting: Adult Health

## 2018-09-19 ENCOUNTER — Other Ambulatory Visit: Payer: Self-pay | Admitting: Nurse Practitioner

## 2018-10-31 ENCOUNTER — Other Ambulatory Visit: Payer: Self-pay | Admitting: Nurse Practitioner

## 2018-12-25 ENCOUNTER — Other Ambulatory Visit: Payer: Self-pay | Admitting: Nurse Practitioner

## 2018-12-26 ENCOUNTER — Telehealth: Payer: Self-pay | Admitting: Neurology

## 2018-12-26 NOTE — Telephone Encounter (Signed)
Last Uric Acid level was 2018. Please advise when Uric Acid level should be rechecked to assure that medication is effective

## 2018-12-26 NOTE — Telephone Encounter (Signed)
Due to current COVID 19 pandemic, our office is severely reducing in office visits until further notice, in order to minimize the risk to our patients and healthcare providers.   Called patient to offer virtual visit for 5/7 appointment. Patient declined virtual appointment as he does not have internet access, Patient accepted telephone visit and understands that he will receive calls from RN, front office staff and Dr. Patient was advised to be ready for these calls.  Pt understands that although there may be some limitations with this type of visit, we will take all precautions to reduce any security or privacy concerns.  Pt understands that this will be treated like an in office visit and we will file with pt's insurance, and there may be a patient responsible charge related to this service.

## 2018-12-27 ENCOUNTER — Encounter: Payer: Self-pay | Admitting: Neurology

## 2018-12-28 NOTE — Telephone Encounter (Signed)
I called pt. Pt's meds, allergies, and PMH were updated.  Pt reports that his leak is bothering him and causing a dry eye. He uses salve to help him and also spoke to his ophthalmologist.

## 2018-12-29 ENCOUNTER — Other Ambulatory Visit: Payer: Self-pay

## 2018-12-29 ENCOUNTER — Ambulatory Visit (INDEPENDENT_AMBULATORY_CARE_PROVIDER_SITE_OTHER): Payer: Medicare Other | Admitting: Neurology

## 2018-12-29 ENCOUNTER — Encounter: Payer: Self-pay | Admitting: Neurology

## 2018-12-29 DIAGNOSIS — G4733 Obstructive sleep apnea (adult) (pediatric): Secondary | ICD-10-CM

## 2018-12-29 DIAGNOSIS — Z789 Other specified health status: Secondary | ICD-10-CM | POA: Diagnosis not present

## 2018-12-29 NOTE — Progress Notes (Signed)
Interim history:  Eric Chung is an 84 year old right-handed gentleman with an underlying medical history of gout, reflux disease with Barrett's esophagus, hypertension, and obesity, who presents for a virtual, phone based follow-up consultation of his obstructive sleep apnea, treated with BiPAP ST. The patient is unaccompanied today and joins via home phone from his residence, I am located in my office. I last saw him on 10/11/2017, at which time he was compliant with his BiPAP but residual AHI was around 13 at the time, leak was very high. He was using a larger fullface mask.  He saw Eric Chung, nurse practitioner in the interim on 03/30/2018, at which time he was compliant with his BiPAP, however AHI was 15.9 per hour and leak very high. He was advised to seek a mask refit with our sleep lab.  He subsequently saw Eric Chung, nurse practitioner on 06/30/2018, at which time he was using his BiPAP, residual AHI was still elevated at 17.2 per hour and leak very high, he was advised to seek a mask refit with his DME company and also consider a chinstrap.  Today, 12/29/2018: Please also see below for virtual visit documentation.  I reviewed his BiPAP compliance data from 11/27/2021 12/27/2018 which is a total of 30 days, during which time he used his machine every night with percent used days greater than 4 hours at 77%, indicating adequate compliance with an average usage of 4 hours and 26 minutes, residual AHI much better than before at 7.6 per hour, still not optimal but much improved, leak high with the 95th percentile at 116.1 L/m on a pressure of 16/12 with a backup rate of 12.   The patient's allergies, current medications, family history, past medical history, past social history, past surgical history and problem list were reviewed and updated as appropriate.    Previously (copied from previous notes for reference):    I first met him on 05/24/2017 at the request of his primary care  physician, at which time the patient reported a recent diagnosis of sleep apnea and recently starting BiPAP therapy when he was still residing in [place]. He was compliant with treatment but was still struggling with the mask and treatment settings. He was advised to come in for a mask refit appointment. He was given a mask to try at home by our sleep lab manager, Eric Chung, and was also instructed to increase his humidity level for better tolerance. I encouraged him to continue treatment and follow-up routinely in 3 months.    I reviewed his BiPAP ST compliance data from 09/12/2017 through 10/11/2017 which is a total of 30 days, during which time he used his BiPAP machine 28 days with percent used days greater than 4 hours at 80%, indicating very good compliance but average usage of only 4 hours of 43 minutes, residual AHI suboptimal at 12.9 per hour, leak on the high side consistently, pressure at 18/14 cm with a backup rate of 12. With an average usage of 4 hours and 43 minutes. Leak is consistently high at 117.9 L/m, pressure of 18/14 with a rate of 12.   05/24/2017: (He) was recently diagnosed with obstructive sleep apnea and placed on BiPAP ST. Prior sleep study results were reviewed today. He had a split-night sleep study on 02/18/2017 at a sleep lab facility in [place]. Baseline sleep study results indicated an AHI of 61.2 per hour. He had no significant PLMS. CPAP was titrated from 5 cm to 8 cm and he was switched to  BiPAP of 19/15 due to central events noted. He was placed on a backup rate due to central apneas and hypopneas persisting. Optimal pressure was determined to be 17-19 cm over 15-16 cm. He was fitted with an air touch fullface mask. I reviewed your office note from 04/15/2017. A BiPAP download was reviewed today, 05/03/2017 through 05/23/2017, which is a total of 21 days, during which time he used his BiPAP every night with percent used days greater than 4 hours at 86%, indicating very good  compliance with an average usage of 5 hours and 9 minutes, residual AHI 8.6 per hour, leak very high with the 95th percentile at 100 L/m on a pressure of 18/14 with a rate of 12. He reports difficulty tolerating the treatment. He has a history of eye problems. He has been seeing an eye doctor, he has been seeing a specialist for dry eyes, he has been given the lubricating ointment to put in his eyes and also was advised to start using goggles at night to prevent the BiPAP air to go into his right eye. He is using a simplus fullface mask size medium. He has 2 other kinds of masks at the house. He needs to use a fullface mask but also has a full beard. He feels like the mask rides up and he has leakage from the mouth. He has increased his ramp time to 45 minutes to adjust better. He has been on treatment for 3 weeks and is not necessarily feeling any better yet. His wife reports that his snoring is much better and the machine is quiet enough for her to be able to sleep. They moved from [place] to [facility] independent living. He is a retired Network engineer. He quit smoking in 1987, drinks alcohol 2-3 times per week, drinks tea typically in the morning, typically no coffee or sodas. Of note, he has gained weight since his move in the past 6 months, in the realm of 20 pounds he estimates. He denies restless leg symptoms or morning headaches. Epworth sleepiness score is 9 out of 24, fatigue score is 41 out of 63. He goes to bed around 10:30 to 11 PM, WT around 7:30.  Prior to his split-night sleep study he had a home sleep test on 01/05/2017 which indicated an AHI of about 46 per hour. He has some difficulty falling asleep and staying asleep, nocturia about 1-2 per night.   His Past Medical History Is Significant For: Past Medical History:  Diagnosis Date   Apnea, sleep    Barrett's esophagus    Gout    Hypertension    Obesity     His Past Surgical History Is Significant For: Past Surgical History:    Procedure Laterality Date   CHOLECYSTECTOMY  08/29/2017   EYE SURGERY Bilateral 2011-2012   catracts removed   HERNIA REPAIR  2005   T. Carlton Adam MD   TONSILECTOMY/ADENOIDECTOMY WITH MYRINGOTOMY  1944    His Family History Is Significant For: Family History  Problem Relation Age of Onset   Osteoporosis Mother    Cancer Father 43       esophagus   Crohn's disease Sister    Birth defects Maternal Grandmother        colon   Diabetes Paternal Grandmother     His Social History Is Significant For: Social History   Socioeconomic History   Marital status: Married    Spouse name: Eric Chung   Number of children: 1   Years of education: Not  on file   Highest education level: Not on file  Occupational History   Not on file  Social Needs   Financial resource strain: Not hard at all   Food insecurity:    Worry: Never true    Inability: Never true   Transportation needs:    Medical: No    Non-medical: No  Tobacco Use   Smoking status: Former Smoker    Packs/day: 1.00    Years: 15.00    Pack years: 15.00   Smokeless tobacco: Never Used  Substance and Sexual Activity   Alcohol use: Yes    Alcohol/week: 3.0 - 4.0 standard drinks    Types: 3 - 4 Standard drinks or equivalent per week    Comment: 2-3 beers a week   Drug use: No   Sexual activity: Not Currently  Lifestyle   Physical activity:    Days per week: 0 days    Minutes per session: 0 min   Stress: Only a little  Relationships   Social connections:    Talks on phone: More than three times a week    Gets together: More than three times a week    Attends religious service: More than 4 times per year    Active member of club or organization: Yes    Attends meetings of clubs or organizations: More than 4 times per year    Relationship status: Married  Other Topics Concern   Not on file  Social History Narrative   Social History     Marital status: Married ,1986            Spouse name: Eric Chung                     Years of education:                 Number of children: 1                Occupational History: Pharmacist, hospital     None on file      Social History Main Topics     Smoking status:                       Smokeless tobacco: Not on file                        Alcohol use: 3-4 drinks per week     Drug use: Unknown     Sexual activity: Not on file            Other Topics            Concern     None on file      Social History Narrative     None on file             His Allergies Are:  Allergies  Allergen Reactions   Penicillins     Has patient had a PCN reaction causing immediate rash, facial/tongue/throat swelling, SOB or lightheadedness with hypotension: yes, rash Has patient had a PCN reaction causing severe rash involving mucus membranes or skin necrosis: no Has patient had a PCN reaction that required hospitalization: no Has patient had a PCN reaction occurring within the last 10 years: No If all of the above answers are "NO", then may proceed with Cephalosporin use.   :   His Current Medications Are:  Outpatient Encounter Medications as of 12/29/2018  Medication Sig  allopurinol (ZYLOPRIM) 100 MG tablet TAKE 1 TABLET BY MOUTH TWICE DAILY.   fluticasone (FLONASE) 50 MCG/ACT nasal spray Place 1 spray into both nostrils daily.    latanoprost (XALATAN) 0.005 % ophthalmic solution Place 1 drop into both eyes at bedtime.   Multiple Vitamins-Minerals (MULTIVITAMIN WITH MINERALS) tablet Take 1 tablet by mouth daily.   omeprazole (PRILOSEC) 20 MG capsule TAKE (1) CAPSULE DAILY. (Patient taking differently: Take 20 mg by mouth 2 (two) times daily before a meal. )   SUMAtriptan (IMITREX) 50 MG tablet Take 50 mg by mouth as needed.   verapamil (CALAN-SR) 180 MG CR tablet TAKE ONE TABLET AT BEDTIME.   No facility-administered encounter medications on file as of 12/29/2018.   :  Review of Systems:  Out of a complete 14 point review of systems, all are reviewed and  negative with the exception of these symptoms as listed below:  Virtual Visit via Telephone Note on 12/29/18:   I connected with Eric Chung on 12/29/18 at  1:00 PM EDT by telephone and verified that I am speaking with the correct person using two identifiers.   I discussed the limitations, risks, security and privacy concerns of performing an evaluation and management service by telephone and the availability of in person appointments. I also discussed with the patient that there may be a patient responsible charge related to this service. The patient expressed understanding and agreed to proceed.   History of Present Illness: He reports that he tried the chinstrap but it was uncomfortable, so he gave up on using it.  He has eye dryness in the right eye.  It seems like the air pushes up through the tear duct canal into his eye, it is only on the right side.  His eye doctor recommended that he use an ointment.  He has been using an ointment.  His glaucoma is stable he is wondering if we can reduce the pressure even more.  We reduced it from 18/14 to 16/12.  Otherwise, he is doing well, no recent illness thankfully, he is in independent living at [facility], lives with his wife who is 23 years old.    Observations/Objective: He appears in no acute distress, pleasant and conversant.Comprehension is good, no problems with language skills, speech is clear without dysarthria, hypophonia or voice tremor noted.  Assessment and Plan: In summary,Eric Jenkinsis a very pleasant 51 year oldmalewith an underlying medical history of gout, reflux disease with Barrett's esophagus, hypertension, and obesity, whopresents for a phone based follow-up consultation of his obstructive sleep apnea, treated with BiPAP ST. He is Still struggling with the pressure, we reduced his pressure to 16/12 last year from previously 18/14He continues to use a fullface mask.He tried a chinstrap but found it uncomfortable.He is  struggling with the dryness of the right eye.He is advised to continue to use the lubricating ointment but also use a sleep mask or even an eye patch at night for the right eye. He would like to try a lower pressure.  I suggested we reduce it to 14/10 centimeters with a backup rate of 12. I will request the pressure change through his DME company, [employer]. He is commended for his treatment adherence despite difficulties. Of note, he had a split-night sleep study done in [place] on 02/18/2017. He was titrated on CPAP and then on BiPAP ST. He is advised to follow-up in about one year, with the nurse practitioner. In the meantime, in about a month or 6 weeks I would like  to review his compliance data with the new settings.He is encouraged to call our office and to remind [place] to pull compliance data. I answered all his questions today and he was in agreement. His residual AHI is better at around 7.6/hour. Leak is rather high consistently.   Follow Up Instructions: 1. Continue using BiPAP regularly with full compliance, patient is commended for treatment adherence. We will reduce pressure to 14/10 cm, rate of 12/min. 2. Follow-up yearly, with NP next time.  3. Call in about 1 month to have [place] pull remote compliance data. I will review and we will call him. 4. Use ointment in eye or at least right eye with sleep mask or eye patch for protection from dryness. 5. Call or email through My Chart for any interim questions or concerns.    I discussed the assessment and treatment plan with the patient. The patient was provided an opportunity to ask questions and all were answered. The patient agreed with the plan and demonstrated an understanding of the instructions.   The patient was advised to call back or seek an in-person evaluation if the symptoms worsen or if the condition fails to improve as anticipated.  I provided 12 minutes of non-face-to-face time during this encounter.   Star Age, MD

## 2018-12-29 NOTE — Progress Notes (Signed)
Order for bipap pressure change sent to [facility] via community message in epic. Confirmation received that the order transmitted was successful.

## 2018-12-29 NOTE — Patient Instructions (Signed)
Given over the phone during today's phone call virtual visit.  

## 2019-01-30 ENCOUNTER — Other Ambulatory Visit: Payer: Self-pay

## 2019-01-30 ENCOUNTER — Other Ambulatory Visit: Payer: Self-pay | Admitting: Nurse Practitioner

## 2019-01-30 DIAGNOSIS — I10 Essential (primary) hypertension: Secondary | ICD-10-CM

## 2019-01-30 DIAGNOSIS — R609 Edema, unspecified: Secondary | ICD-10-CM

## 2019-01-30 DIAGNOSIS — E782 Mixed hyperlipidemia: Secondary | ICD-10-CM

## 2019-01-30 DIAGNOSIS — R7303 Prediabetes: Secondary | ICD-10-CM

## 2019-01-30 DIAGNOSIS — K2270 Barrett's esophagus without dysplasia: Secondary | ICD-10-CM

## 2019-01-31 ENCOUNTER — Other Ambulatory Visit: Payer: Self-pay

## 2019-02-02 ENCOUNTER — Non-Acute Institutional Stay: Payer: Medicare Other | Admitting: Nurse Practitioner

## 2019-02-02 ENCOUNTER — Other Ambulatory Visit: Payer: Self-pay

## 2019-02-02 ENCOUNTER — Encounter: Payer: Self-pay | Admitting: Nurse Practitioner

## 2019-02-02 DIAGNOSIS — K2270 Barrett's esophagus without dysplasia: Secondary | ICD-10-CM | POA: Diagnosis not present

## 2019-02-02 DIAGNOSIS — M10071 Idiopathic gout, right ankle and foot: Secondary | ICD-10-CM

## 2019-02-02 DIAGNOSIS — G43109 Migraine with aura, not intractable, without status migrainosus: Secondary | ICD-10-CM | POA: Diagnosis not present

## 2019-02-02 DIAGNOSIS — I10 Essential (primary) hypertension: Secondary | ICD-10-CM | POA: Diagnosis not present

## 2019-02-02 DIAGNOSIS — R635 Abnormal weight gain: Secondary | ICD-10-CM

## 2019-02-02 NOTE — Assessment & Plan Note (Signed)
Migraine headache is well controlled, continue Imitrex 50mg  prn.

## 2019-02-02 NOTE — Assessment & Plan Note (Signed)
Stable, continue Allopurinol 100mg qd  

## 2019-02-02 NOTE — Assessment & Plan Note (Signed)
blood pressure is controlled on Verapamil 178m qd. Update CMP/eGFR

## 2019-02-02 NOTE — Patient Instructions (Addendum)
Diet and exercise, monitor weight, will obtain TSH, lipid panel, CBC/diff, CMP/eGFR prior to the next appointment. Next appt:  4 months with Dr. Lyndel Safe

## 2019-02-02 NOTE — Assessment & Plan Note (Signed)
Stable, continue Omeprazole 20mg  qd, update CBC/diff

## 2019-02-02 NOTE — Progress Notes (Signed)
Location:   [facility]   Place of Service:  Clinic (12) Provider: Marlana Latus NP  Code Status: DNR Goals of Care: [place] 02/02/2019  Does Patient Have a Medical Advance Directive? Yes  Type of Advance Directive Living will  Does patient want to make changes to medical advance directive? No - Patient declined  Copy of [place] in Chart? -     Chief Complaint  Patient presents with  . Medical Management of Chronic Issues    6 month follow up     HPI: Patient is a 84 y.o. male seen today for medical management of chronic diseases.    The patient has history of HTN, blood pressure is controlled on Verapamil 165m qd. Migraine headache is well controlled on Imitrex 5105mprn. GERD, stable on Omeprazole 2040md. No acute gouty attacks, on Allopurinol 100m7m.    Past Medical History:  Diagnosis Date  . Apnea, sleep   . Barrett's esophagus   . Gout   . Hypertension   . Obesity     Past Surgical History:  Procedure Laterality Date  . CHOLECYSTECTOMY  08/29/2017  . EYE SURGERY Bilateral 2011-2012   catracts removed  . HERNIA REPAIR  2005   T. ThorCarlton Adam . TONSILECTOMY/ADENOIDECTOMY WITH MYRINGOTOMY  [id number]  Allergen Reactions  . Penicillins     Has patient had a PCN reaction causing immediate rash, facial/tongue/throat swelling, SOB or lightheadedness with hypotension: yes, rash Has patient had a PCN reaction causing severe rash involving mucus membranes or skin necrosis: no Has patient had a PCN reaction that required hospitalization: no Has patient had a PCN reaction occurring within the last 10 years: No If all of the above answers are "NO", then may proceed with Cephalosporin use.     Allergies as of 02/02/2019      Reactions   Penicillins    Has patient had a PCN reaction causing immediate rash, facial/tongue/throat swelling, SOB or lightheadedness with hypotension: yes, rash Has patient had a PCN reaction causing  severe rash involving mucus membranes or skin necrosis: no Has patient had a PCN reaction that required hospitalization: no Has patient had a PCN reaction occurring within the last 10 years: No If all of the above answers are "NO", then may proceed with Cephalosporin use.      Medication List       Accurate as of February 02, 2019  4:31 PM. If you have any questions, ask your nurse or doctor.        allopurinol 100 MG tablet Commonly known as: ZYLOPRIM TAKE 1 TABLET BY MOUTH TWICE DAILY.   fluticasone 50 MCG/ACT nasal spray Commonly known as: FLONASE Place 1 spray into both nostrils daily.   latanoprost 0.005 % ophthalmic solution Commonly known as: XALATAN Place 1 drop into both eyes at bedtime.   multivitamin with minerals tablet Take 1 tablet by mouth daily.   omeprazole 20 MG capsule Commonly known as: PRILOSEC TAKE (1) CAPSULE DAILY. What changed: See the new instructions.   SUMAtriptan 50 MG tablet Commonly known as: IMITREX Take 50 mg by mouth as needed.   verapamil 180 MG CR tablet Commonly known as: CALAN-SR TAKE ONE TABLET AT BEDTIME.       Review of Systems:  Review of Systems  Constitutional: Positive for unexpected weight change. Negative for activity change, appetite change, chills, diaphoresis, fatigue and fever.       Wight gain #3Ibs  in the past 6 months.   HENT: Positive for hearing loss. Negative for congestion and voice change.   Eyes: Negative for visual disturbance.  Respiratory: Negative for cough, shortness of breath and wheezing.        Sleep apnea  Cardiovascular: Negative for chest pain, palpitations and leg swelling.  Gastrointestinal: Negative for abdominal distention, abdominal pain, constipation, diarrhea, nausea and vomiting.  Genitourinary: Negative for difficulty urinating, dysuria and urgency.       2x/night  Musculoskeletal: Negative for gait problem.  Skin: Negative for color change and pallor.  Neurological: Negative for  dizziness, speech difficulty, weakness and headaches.  Psychiatric/Behavioral: Negative for agitation, behavioral problems, hallucinations and sleep disturbance. The patient is not nervous/anxious.     Health Maintenance  Topic Date Due  . INFLUENZA VACCINE  03/25/2019  . TETANUS/TDAP  07/31/2026  . PNA vac Low Risk Adult  Completed    Physical Exam: Vitals:   02/02/19 1318  BP: 122/78  Pulse: 72  Temp: 97.7 F (36.5 C)  SpO2: 96%  Weight: 243 lb (110.2 kg)  Height: 5' 11"  (1.803 m)   Body mass index is 33.89 kg/m. Physical Exam Vitals signs and nursing note reviewed.  Constitutional:      General: He is not in acute distress.    Appearance: Normal appearance. He is obese. He is not ill-appearing, toxic-appearing or diaphoretic.  HENT:     Head: Normocephalic and atraumatic.     Nose: Nose normal. No congestion or rhinorrhea.     Mouth/Throat:     Mouth: Mucous membranes are moist.  Eyes:     Extraocular Movements: Extraocular movements intact.     Conjunctiva/sclera: Conjunctivae normal.     Pupils: Pupils are equal, round, and reactive to light.  Neck:     Musculoskeletal: Normal range of motion and neck supple.  Cardiovascular:     Rate and Rhythm: Normal rate.     Heart sounds: No murmur.  Pulmonary:     Effort: Pulmonary effort is normal.     Breath sounds: Rales present. No wheezing or rhonchi.     Comments: Rales bibasilar. Abdominal:     General: Bowel sounds are normal. There is no distension.     Palpations: Abdomen is soft.     Tenderness: There is no abdominal tenderness. There is no right CVA tenderness, left CVA tenderness, guarding or rebound.  Musculoskeletal:     Right lower leg: No edema.     Left lower leg: No edema.  Skin:    General: Skin is warm and dry.  Neurological:     General: No focal deficit present.     Mental Status: He is alert and oriented to person, place, and time. Mental status is at baseline.     Cranial Nerves: No  cranial nerve deficit.     Motor: No weakness.     Coordination: Coordination normal.     Gait: Gait normal.  Psychiatric:        Mood and Affect: Mood normal.        Behavior: Behavior normal.        Thought Content: Thought content normal.        Judgment: Judgment normal.     Labs reviewed: Basic Metabolic Panel: No results for input(s): NA, K, CL, CO2, GLUCOSE, BUN, CREATININE, CALCIUM, MG, PHOS, TSH in the last 8760 hours. Liver Function Tests: No results for input(s): AST, ALT, ALKPHOS, BILITOT, PROT, ALBUMIN in the last 8760 hours. No results  for input(s): LIPASE, AMYLASE in the last 8760 hours. No results for input(s): AMMONIA in the last 8760 hours. CBC: No results for input(s): WBC, NEUTROABS, HGB, HCT, MCV, PLT in the last 8760 hours. Lipid Panel: No results for input(s): CHOL, HDL, LDLCALC, TRIG, CHOLHDL, LDLDIRECT in the last 8760 hours. Lab Results  Component Value Date   HGBA1C 5.8 (H) 01/27/2018    Procedures since last visit: No results found.  Assessment/Plan  Migraine headache with aura Migraine headache is well controlled, continue Imitrex 60m prn.  Hypertension  blood pressure is controlled on Verapamil 184mqd. Update CMP/eGFR  Barrett's esophagus Stable, continue Omeprazole 2060md, update CBC/diff  Gout of ankle Stable, continue Allopurinol 100m56m.   Weight gain #3Ibs in the past 6 months, no apparent edema or respiratory symptoms, diet and exercise, observe. Update TSH   Labs/tests ordered: CBC/diff, CMP/eGFR, TSH, lipid panel,  prior to the next appointment   Next appt:  4 months with Dr. GuptLyndel Safe

## 2019-02-02 NOTE — Assessment & Plan Note (Signed)
#  3Ibs in the past 6 months, no apparent edema or respiratory symptoms, diet and exercise, observe. Update TSH

## 2019-02-11 ENCOUNTER — Other Ambulatory Visit: Payer: Self-pay | Admitting: Nurse Practitioner

## 2019-02-20 ENCOUNTER — Other Ambulatory Visit: Payer: Self-pay | Admitting: Nurse Practitioner

## 2019-02-20 DIAGNOSIS — I2699 Other pulmonary embolism without acute cor pulmonale: Secondary | ICD-10-CM | POA: Insufficient documentation

## 2019-02-21 DIAGNOSIS — I82402 Acute embolism and thrombosis of unspecified deep veins of left lower extremity: Secondary | ICD-10-CM | POA: Insufficient documentation

## 2019-03-03 ENCOUNTER — Telehealth: Payer: Self-pay | Admitting: Neurology

## 2019-03-03 NOTE — Telephone Encounter (Signed)
Pt called and stated that he was told to call and remind the provider that his BiPap machine is to be checked for the numbers and the pressure now that it has been 30 days. Please advise.

## 2019-03-05 ENCOUNTER — Encounter: Payer: Self-pay | Admitting: Neurology

## 2019-03-06 NOTE — Telephone Encounter (Signed)
Please call pt:  I reviewed patient's BiPAP ST compliance data for the past 30 days from 02/04/2019 through 03/05/2019, during which time he used his machine 29 days with percent used days greater than 4 hours at 87%, indicating very good compliance but average usage of 4 hours and 42 minutes, residual AHI looks good at 5.2/h, leak is still high on the current pressure setting, we reduced his BiPAP pressure from 16/12 to 14/10 centimeters.  He continues to have a high leak, it is slightly better but not considerably better compared to his download from May.  In essence, his lower pressure setting is working well but his leak is still high, he may have to get a different style of mask from his DME provider [employer]. He can follow-up as scheduled in 1 year. If he is tolerating the pressure, he can continue with the current pressure setting of 14/10 centimeters.

## 2019-03-06 NOTE — Telephone Encounter (Signed)
I called pt, discussed these recommendations with him. He is still having trouble with his right eye as discussed with Dr. Rexene Alberts at his last visit. He has done her recommendations and has seen an ophthlamologist. He will ask his PCP for a referral to an ENT for further input. Pt verbalized understanding of recommendations. Pt had no questions at this time but was encouraged to call back if questions arise.

## 2019-03-17 ENCOUNTER — Other Ambulatory Visit: Payer: Self-pay

## 2019-03-17 ENCOUNTER — Encounter: Payer: Self-pay | Admitting: Internal Medicine

## 2019-03-17 ENCOUNTER — Non-Acute Institutional Stay: Payer: Medicare Other | Admitting: Internal Medicine

## 2019-03-17 VITALS — BP 120/84 | HR 78 | Temp 98.3°F | Ht 71.0 in | Wt 242.2 lb

## 2019-03-17 DIAGNOSIS — I10 Essential (primary) hypertension: Secondary | ICD-10-CM

## 2019-03-17 DIAGNOSIS — M10071 Idiopathic gout, right ankle and foot: Secondary | ICD-10-CM

## 2019-03-17 DIAGNOSIS — G43109 Migraine with aura, not intractable, without status migrainosus: Secondary | ICD-10-CM

## 2019-03-17 DIAGNOSIS — G4730 Sleep apnea, unspecified: Secondary | ICD-10-CM

## 2019-03-17 DIAGNOSIS — I824Y2 Acute embolism and thrombosis of unspecified deep veins of left proximal lower extremity: Secondary | ICD-10-CM | POA: Diagnosis not present

## 2019-03-17 DIAGNOSIS — N182 Chronic kidney disease, stage 2 (mild): Secondary | ICD-10-CM

## 2019-03-17 DIAGNOSIS — I2699 Other pulmonary embolism without acute cor pulmonale: Secondary | ICD-10-CM | POA: Diagnosis not present

## 2019-03-17 DIAGNOSIS — I129 Hypertensive chronic kidney disease with stage 1 through stage 4 chronic kidney disease, or unspecified chronic kidney disease: Secondary | ICD-10-CM

## 2019-03-18 NOTE — Progress Notes (Signed)
Location:  [facility] of Service:  Clinic (12)  Provider:   Code Status:  Goals of Care:  Advanced Directives 03/17/2019  Does Patient Have a Medical Advance Directive? No;Yes  Type of Advance Directive Living will  Does patient want to make changes to medical advance directive? No - Patient declined  Copy of [place] in Chart? -  Would patient like information on creating a medical advance directive? No - Patient declined     Chief Complaint  Patient presents with  . Hospitalization Follow-up    follow up with blood clot in his lung    HPI: Patient is a 84 y.o. male seen today for medical management of chronic diseases.   Patient has a history of hypertension, migraine headaches, GERD and history of gout.  History of sleep apnea Recently he was diagnosed with PE and is now on Eliquis Patient went to ED on 6/29 with S OB and right pleuritic chest pain with low-grade fever and cough.  His CT scan of the chest showed right lower lobe primary emboli.  His left leg ultrasound showed DVT.  He was started on Eliquis He also saw hematologist for this unprovoked DVT.  And per their note he needs a long-term anticoagulation with Eliquis. Patient is not very pleased about it.  He does say that he is not very active and has been in his apartment most of the time since COVID It did not have any other complaints today Lives in a friend's home community with his wife.  Is very independent  Past Medical History:  Diagnosis Date  . Apnea, sleep   . Barrett's esophagus   . Gout   . Hypertension   . Obesity     Past Surgical History:  Procedure Laterality Date  . CHOLECYSTECTOMY  08/29/2017  . EYE SURGERY Bilateral 2011-2012   catracts removed  . HERNIA REPAIR  2005   T. Carlton Adam MD  . TONSILECTOMY/ADENOIDECTOMY WITH MYRINGOTOMY  [id number]    Allergies  Allergen Reactions  . Penicillins     Has patient had a PCN reaction causing immediate rash,  facial/tongue/throat swelling, SOB or lightheadedness with hypotension: yes, rash Has patient had a PCN reaction causing severe rash involving mucus membranes or skin necrosis: no Has patient had a PCN reaction that required hospitalization: no Has patient had a PCN reaction occurring within the last 10 years: No If all of the above answers are "NO", then may proceed with Cephalosporin use.     Outpatient Encounter Medications as of 03/17/2019  Medication Sig  . allopurinol (ZYLOPRIM) 100 MG tablet TAKE 1 TABLET BY MOUTH TWICE DAILY.  . fluticasone (FLONASE) 50 MCG/ACT nasal spray Place 1 spray into both nostrils daily.   Marland Kitchen latanoprost (XALATAN) 0.005 % ophthalmic solution Place 1 drop into both eyes at bedtime.  . Multiple Vitamins-Minerals (MULTIVITAMIN WITH MINERALS) tablet Take 1 tablet by mouth daily.  Marland Kitchen omeprazole (PRILOSEC) 20 MG capsule TAKE (1) CAPSULE DAILY.  . SUMAtriptan (IMITREX) 50 MG tablet Take 50 mg by mouth as needed.  . verapamil (CALAN-SR) 180 MG CR tablet TAKE ONE TABLET AT BEDTIME.  Marland Kitchen ELIQUIS 5 MG TABS tablet Take 5 mg by mouth 2 (two) times a day.   No facility-administered encounter medications on file as of 03/17/2019.     Review of Systems:  Review of Systems  Review of Systems  Constitutional: Negative for activity change, appetite change, chills, diaphoresis, fatigue and fever.  HENT: Negative for mouth sores, postnasal drip, rhinorrhea, sinus pain and sore throat.   Respiratory: Negative for apnea, cough, chest tightness, shortness of breath and wheezing.   Cardiovascular: Negative for chest pain, palpitations and leg swelling.  Gastrointestinal: Negative for abdominal distention, abdominal pain, constipation, diarrhea, nausea and vomiting.  Genitourinary: Negative for dysuria and frequency.  Musculoskeletal: Negative for arthralgias, joint swelling and myalgias.  Skin: Negative for rash.  Neurological: Negative for dizziness, syncope, weakness,  light-headedness and numbness.  Psychiatric/Behavioral: Negative for behavioral problems, confusion and sleep disturbance.     Health Maintenance  Topic Date Due  . INFLUENZA VACCINE  03/25/2019  . TETANUS/TDAP  07/31/2026  . PNA vac Low Risk Adult  Completed    Physical Exam: Vitals:   03/17/19 0955  BP: 120/84  Pulse: 78  Temp: 98.3 F (36.8 C)  TempSrc: Oral  SpO2: 94%  Weight: 242 lb 3.2 oz (109.9 kg)  Height: 5\' 11"  (1.803 m)   Body mass index is 33.78 kg/m. Physical Exam  Constitutional: Oriented to person, place, and time. Well-developed and well-nourished.  HENT:  Head: Normocephalic.  Mouth/Throat: Oropharynx is clear and moist.  Eyes: Pupils are equal, round, and reactive to light.  Neck: Neck supple.  Cardiovascular: Normal rate and normal heart sounds.  No murmur heard. Pulmonary/Chest: Effort normal and breath sounds normal. No respiratory distress. No wheezes. She has no rales.  Abdominal: Soft. Bowel sounds are normal. No distension. There is no tenderness. There is no rebound.  Musculoskeletal: No edema.  Lymphadenopathy: none Neurological: Alert and oriented to person, place, and time. Gait was normal Skin: Skin is warm and dry.  Psychiatric: Normal mood and affect. Behavior is normal. Thought content normal.    Labs reviewed: Basic Metabolic Panel: No results for input(s): NA, K, CL, CO2, GLUCOSE, BUN, CREATININE, CALCIUM, MG, PHOS, TSH in the last 8760 hours. Liver Function Tests: No results for input(s): AST, ALT, ALKPHOS, BILITOT, PROT, ALBUMIN in the last 8760 hours. No results for input(s): LIPASE, AMYLASE in the last 8760 hours. No results for input(s): AMMONIA in the last 8760 hours. CBC: No results for input(s): WBC, NEUTROABS, HGB, HCT, MCV, PLT in the last 8760 hours. Lipid Panel: No results for input(s): CHOL, HDL, LDLCALC, TRIG, CHOLHDL, LDLDIRECT in the last 8760 hours. Lab Results  Component Value Date   HGBA1C 5.8 (H)  01/27/2018    Procedures since last visit: No results found.  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

## 2019-03-29 ENCOUNTER — Other Ambulatory Visit: Payer: Self-pay | Admitting: *Deleted

## 2019-03-29 ENCOUNTER — Other Ambulatory Visit: Payer: Self-pay

## 2019-03-29 DIAGNOSIS — I10 Essential (primary) hypertension: Secondary | ICD-10-CM

## 2019-03-29 DIAGNOSIS — K2270 Barrett's esophagus without dysplasia: Secondary | ICD-10-CM

## 2019-03-29 DIAGNOSIS — R7303 Prediabetes: Secondary | ICD-10-CM

## 2019-03-30 ENCOUNTER — Other Ambulatory Visit: Payer: Medicare Other

## 2019-03-30 ENCOUNTER — Other Ambulatory Visit: Payer: Self-pay

## 2019-03-30 DIAGNOSIS — I824Y2 Acute embolism and thrombosis of unspecified deep veins of left proximal lower extremity: Secondary | ICD-10-CM

## 2019-04-04 ENCOUNTER — Other Ambulatory Visit: Payer: Self-pay | Admitting: Nurse Practitioner

## 2019-04-04 NOTE — Telephone Encounter (Signed)
No uric acid level checked on patient within last 12 months.  I will send to Mast, Man X, NP and her Medical Assistant for further review and approval if necessary

## 2019-04-04 NOTE — Telephone Encounter (Signed)
Go ahead to refill the Allopurinol for him, thank you

## 2019-04-07 ENCOUNTER — Other Ambulatory Visit: Payer: Self-pay | Admitting: *Deleted

## 2019-04-07 DIAGNOSIS — R7303 Prediabetes: Secondary | ICD-10-CM

## 2019-04-10 ENCOUNTER — Other Ambulatory Visit: Payer: Self-pay

## 2019-04-10 ENCOUNTER — Other Ambulatory Visit: Payer: Self-pay | Admitting: *Deleted

## 2019-04-10 DIAGNOSIS — I824Y2 Acute embolism and thrombosis of unspecified deep veins of left proximal lower extremity: Secondary | ICD-10-CM

## 2019-04-10 DIAGNOSIS — K2270 Barrett's esophagus without dysplasia: Secondary | ICD-10-CM

## 2019-04-10 DIAGNOSIS — R7303 Prediabetes: Secondary | ICD-10-CM

## 2019-04-10 DIAGNOSIS — I10 Essential (primary) hypertension: Secondary | ICD-10-CM

## 2019-04-11 ENCOUNTER — Other Ambulatory Visit: Payer: Medicare Other

## 2019-04-11 ENCOUNTER — Other Ambulatory Visit: Payer: Self-pay

## 2019-04-11 DIAGNOSIS — I824Y2 Acute embolism and thrombosis of unspecified deep veins of left proximal lower extremity: Secondary | ICD-10-CM

## 2019-04-11 LAB — PSA: PSA: 2.3 ng/mL (ref <=4.0)

## 2019-04-18 ENCOUNTER — Other Ambulatory Visit: Payer: Self-pay

## 2019-04-19 LAB — CBC WITH DIFFERENTIAL/PLATELET
Absolute Monocytes: 752 cells/uL (ref 200–950)
Basophils Absolute: 40 cells/uL (ref 0–200)
Basophils Relative: 0.5 %
Eosinophils Absolute: 232 cells/uL (ref 15–500)
Eosinophils Relative: 2.9 %
HCT: 44.7 % (ref 38.5–50.0)
Hemoglobin: 14.9 g/dL (ref 13.2–17.1)
Lymphs Abs: 3240 cells/uL (ref 850–3900)
MCH: 30.5 pg (ref 27.0–33.0)
MCHC: 33.3 g/dL (ref 32.0–36.0)
MCV: 91.6 fL (ref 80.0–100.0)
MPV: 10.8 fL (ref 7.5–12.5)
Monocytes Relative: 9.4 %
Neutro Abs: 3736 cells/uL (ref 1500–7800)
Neutrophils Relative %: 46.7 %
Platelets: 257 10*3/uL (ref 140–400)
RBC: 4.88 10*6/uL (ref 4.20–5.80)
RDW: 14 % (ref 11.0–15.0)
Total Lymphocyte: 40.5 %
WBC: 8 10*3/uL (ref 3.8–10.8)

## 2019-04-19 LAB — COMPLETE METABOLIC PANEL WITH GFR
AG Ratio: 1.3 (calc) (ref 1.0–2.5)
ALT: 13 U/L (ref 9–46)
AST: 17 U/L (ref 10–35)
Albumin: 3.9 g/dL (ref 3.6–5.1)
Alkaline phosphatase (APISO): 75 U/L (ref 35–144)
BUN: 13 mg/dL (ref 7–25)
CO2: 24 mmol/L (ref 20–32)
Calcium: 9.1 mg/dL (ref 8.6–10.3)
Chloride: 105 mmol/L (ref 98–110)
Creat: 1.05 mg/dL (ref 0.70–1.11)
GFR, Est African American: 76 mL/min/{1.73_m2} (ref >=60)
GFR, Est Non African American: 66 mL/min/{1.73_m2} (ref >=60)
Globulin: 3.1 g/dL (calc) (ref 1.9–3.7)
Glucose, Bld: 114 mg/dL — ABNORMAL HIGH (ref 65–99)
Potassium: 4 mmol/L (ref 3.5–5.3)
Sodium: 138 mmol/L (ref 135–146)
Total Bilirubin: 0.5 mg/dL (ref 0.2–1.2)
Total Protein: 7 g/dL (ref 6.1–8.1)

## 2019-04-19 LAB — TSH: TSH: 2.22 mIU/L (ref 0.40–4.50)

## 2019-04-19 LAB — HEMOGLOBIN A1C
Hgb A1c MFr Bld: 5.9 % of total Hgb — ABNORMAL HIGH (ref <5.7)
Mean Plasma Glucose: 123 (calc)
eAG (mmol/L): 6.8 (calc)

## 2019-04-19 LAB — LIPID PANEL
Cholesterol: 209 mg/dL — ABNORMAL HIGH (ref <200)
HDL: 42 mg/dL (ref >=40)
LDL Cholesterol (Calc): 136 mg/dL (calc) — ABNORMAL HIGH
Non-HDL Cholesterol (Calc): 167 mg/dL (calc) — ABNORMAL HIGH (ref <130)
Total CHOL/HDL Ratio: 5 (calc) — ABNORMAL HIGH (ref <5.0)
Triglycerides: 173 mg/dL — ABNORMAL HIGH (ref <150)

## 2019-05-15 ENCOUNTER — Other Ambulatory Visit: Payer: Self-pay | Admitting: Nurse Practitioner

## 2019-05-22 ENCOUNTER — Other Ambulatory Visit: Payer: Self-pay | Admitting: Nurse Practitioner

## 2019-06-06 ENCOUNTER — Other Ambulatory Visit: Payer: Self-pay

## 2019-06-08 ENCOUNTER — Other Ambulatory Visit: Payer: Self-pay

## 2019-06-09 ENCOUNTER — Encounter: Payer: Self-pay | Admitting: Internal Medicine

## 2019-06-09 ENCOUNTER — Non-Acute Institutional Stay: Payer: Medicare Other | Admitting: Internal Medicine

## 2019-06-09 ENCOUNTER — Other Ambulatory Visit: Payer: Self-pay

## 2019-06-09 VITALS — BP 124/80 | HR 76 | Temp 97.1°F | Ht 71.0 in | Wt 248.6 lb

## 2019-06-09 DIAGNOSIS — I2699 Other pulmonary embolism without acute cor pulmonale: Secondary | ICD-10-CM | POA: Diagnosis not present

## 2019-06-09 DIAGNOSIS — I824Y2 Acute embolism and thrombosis of unspecified deep veins of left proximal lower extremity: Secondary | ICD-10-CM | POA: Diagnosis not present

## 2019-06-09 DIAGNOSIS — I129 Hypertensive chronic kidney disease with stage 1 through stage 4 chronic kidney disease, or unspecified chronic kidney disease: Secondary | ICD-10-CM | POA: Diagnosis not present

## 2019-06-09 DIAGNOSIS — G4730 Sleep apnea, unspecified: Secondary | ICD-10-CM

## 2019-06-09 DIAGNOSIS — I10 Essential (primary) hypertension: Secondary | ICD-10-CM

## 2019-06-09 DIAGNOSIS — M10071 Idiopathic gout, right ankle and foot: Secondary | ICD-10-CM

## 2019-06-09 DIAGNOSIS — N182 Chronic kidney disease, stage 2 (mild): Secondary | ICD-10-CM

## 2019-06-11 NOTE — Progress Notes (Signed)
Location: [place] of Service:  Clinic (12)  Provider:   Code Status:  Goals of Care:  Advanced Directives 03/17/2019  Does Patient Have a Medical Advance Directive? No;Yes  Type of Advance Directive Living will  Does patient want to make changes to medical advance directive? No - Patient declined  Copy of [place] in Chart? -  Would patient like information on creating a medical advance directive? No - Patient declined     Chief Complaint  Patient presents with  . Medical Management of Chronic Issues    4 month follow up    HPI: Patient is a 84 y.o. male seen today for an Routine Visit for Follow up  Patient has a history of hypertension, migraine headaches, GERD and history of gout.  History of sleep apnea Recently he was diagnosed with PE in 6/20 and is now on Eliquis. His CT scan of the chest showed right lower lobe primary emboli.  His left leg ultrasound showed DVT.  He was started on Eliquis He also saw hematologist for this unprovoked DVT.  And per their note he needs a long-term anticoagulation with Eliquis.  Patient again wanted to discuss since he has been staying on Eliquis.  He has appointment with hematology in a month and I told him that he needs to continue Eliquis for now. He had started exercising and was complaining that he sometimes gets short of breath sometimes.  His echo in the hospital and had showed EF of 55 to 60% with no wall motion abnormalities.  He denies any wheezing  sweating or chest pain He lives with his wife.  According to him his wife is having some issues with cognition. He also wanted me to know that he wants to be DNR Past Medical History:  Diagnosis Date  . Apnea, sleep   . Barrett's esophagus   . Gout   . Hypertension   . Obesity     Past Surgical History:  Procedure Laterality Date  . CHOLECYSTECTOMY  08/29/2017  . EYE SURGERY Bilateral 2011-2012   catracts removed  . HERNIA REPAIR   2005   T. Carlton Adam MD  . TONSILECTOMY/ADENOIDECTOMY WITH MYRINGOTOMY  [id number]    Allergies  Allergen Reactions  . Penicillins     Has patient had a PCN reaction causing immediate rash, facial/tongue/throat swelling, SOB or lightheadedness with hypotension: yes, rash Has patient had a PCN reaction causing severe rash involving mucus membranes or skin necrosis: no Has patient had a PCN reaction that required hospitalization: no Has patient had a PCN reaction occurring within the last 10 years: No If all of the above answers are "NO", then may proceed with Cephalosporin use.     Outpatient Encounter Medications as of 06/09/2019  Medication Sig  . allopurinol (ZYLOPRIM) 100 MG tablet TAKE 1 TABLET BY MOUTH TWICE DAILY.  Marland Kitchen ELIQUIS 5 MG TABS tablet Take 5 mg by mouth 2 (two) times a day.  . fluticasone (FLONASE) 50 MCG/ACT nasal spray Place 1 spray into both nostrils daily.   Marland Kitchen latanoprost (XALATAN) 0.005 % ophthalmic solution Place 1 drop into both eyes at bedtime.  . Multiple Vitamins-Minerals (MULTIVITAMIN WITH MINERALS) tablet Take 1 tablet by mouth daily.  Marland Kitchen omeprazole (PRILOSEC) 20 MG capsule TAKE (1) CAPSULE DAILY.  . SUMAtriptan (IMITREX) 50 MG tablet Take 50 mg by mouth as needed.  . verapamil (CALAN-SR) 180 MG CR tablet TAKE ONE TABLET AT BEDTIME.   No facility-administered encounter  medications on file as of 06/09/2019.     Review of Systems:  Review of Systems  Constitutional: Positive for activity change.  HENT: Negative.   Respiratory: Positive for shortness of breath.   Cardiovascular: Positive for leg swelling.  Gastrointestinal: Negative.   Genitourinary: Negative.   Musculoskeletal: Negative.   Neurological: Negative.   Psychiatric/Behavioral: Negative.     Health Maintenance  Topic Date Due  . TETANUS/TDAP  07/31/2026  . INFLUENZA VACCINE  Completed  . PNA vac Low Risk Adult  Completed    Physical Exam: Vitals:   06/09/19 1057  BP: 124/80  Pulse: 76   Temp: (!) 97.1 F (36.2 C)  SpO2: 96%  Weight: 248 lb 9.6 oz (112.8 kg)  Height: 5\' 11"  (1.803 m)   Body mass index is 34.67 kg/m. Physical Exam Vitals signs reviewed.  Constitutional:      Appearance: He is obese.  HENT:     Head: Normocephalic.     Nose: Nose normal.     Mouth/Throat:     Mouth: Mucous membranes are moist.     Pharynx: Oropharynx is clear.  Eyes:     Pupils: Pupils are equal, round, and reactive to light.  Neck:     Musculoskeletal: Neck supple.  Cardiovascular:     Rate and Rhythm: Normal rate and regular rhythm.     Pulses: Normal pulses.     Heart sounds: Normal heart sounds.  Pulmonary:     Effort: No respiratory distress.     Breath sounds: Normal breath sounds. No wheezing.  Abdominal:     General: Abdomen is flat. Bowel sounds are normal.     Palpations: Abdomen is soft.  Musculoskeletal:        General: Swelling present.  Neurological:     General: No focal deficit present.     Mental Status: He is alert and oriented to person, place, and time.  Psychiatric:        Mood and Affect: Mood normal.     Labs reviewed: Basic Metabolic Panel: Recent Labs    04/18/19 0730  NA 138  K 4.0  CL 105  CO2 24  GLUCOSE 114*  BUN 13  CREATININE 1.05  CALCIUM 9.1  TSH 2.22   Liver Function Tests: Recent Labs    04/18/19 0730  AST 17  ALT 13  BILITOT 0.5  PROT 7.0   No results for input(s): LIPASE, AMYLASE in the last 8760 hours. No results for input(s): AMMONIA in the last 8760 hours. CBC: Recent Labs    04/18/19 0730  WBC 8.0  NEUTROABS 3,736  HGB 14.9  HCT 44.7  MCV 91.6  PLT 257   Lipid Panel: Recent Labs    04/18/19 0730  CHOL 209*  HDL 42  LDLCALC 136*  TRIG 173*  CHOLHDL 5.0*   Lab Results  Component Value Date   HGBA1C 5.9 (H) 04/18/2019    Procedures since last visit: No results found.  Assessment/Plan   Acute pulmonary embolism without acute cor pulmonale (HCC) - Plan:  Continue on Eliquis Will  have him follow-up with his hematologist His PSA was normal He will need a long-term follow-up clinic as this was an unprovoked event  Acute deep vein thrombosis (DVT) of proximal vein of left lower extremity  - Plan: We will continue on Eliquis  Essential hypertension - Plan:  Continue on verapamil Echo in the hospital was normal  Migraine with aura  - Plan:  Doing well with Imitrex  Sleep apnea in adult - Plan:  Uses BiPAP Follows with Neurology  Idiopathic gout  Plan:  Continue on allopurinol  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  His LDL is 136 He said he has no family history of heart disease. At this time he wants to exercise and try to control his diet  Screening for colon cancer Patient states that he had some polyps.  But 3 years ago he had colonoscopy done at a different city and he was told it was clean.  He wants to know if he needs a repeat colonoscopy I discussed with him his  risk of colon cancer is low.  And since he is on Eliquis at this time I would not suggest to hold it for the procedure. Will discuss again in 6 months.  Possible GI referral  ACP Patient wants to discuss most form and DNR issues Will make his appointment and after forms discussed on next visit Labs/tests ordered:  * No order type specified * Next appt:  09/13/2019  Total time spent in this patient care encounter was  40_  minutes; greater than 50% of the visit spent counseling patient and staff, reviewing records , Labs and coordinating care for problems addressed at this encounter.

## 2019-07-05 IMAGING — CT CT ABD-PELV W/ CM
2 of 5 series · 16 of 46 positions shown, 18 images · IV contrast (ISOVUE)
Comparison: None available.

CLINICAL DATA: Initial evaluation for acute nausea, vomiting,
abdominal pain.

EXAM:
CT ABDOMEN AND PELVIS WITH CONTRAST
TECHNIQUE: Multidetector CT imaging of the abdomen and pelvis was performed
using the standard protocol following bolus administration of
intravenous contrast.
CONTRAST:  100 cc of [id number].

[Series 2: axial st · axial · 0.95mm/px · z∈[+1192,+1622]mm · 13 of 100 slices shown, 15 images]
[im 7/100  soft-tissue]
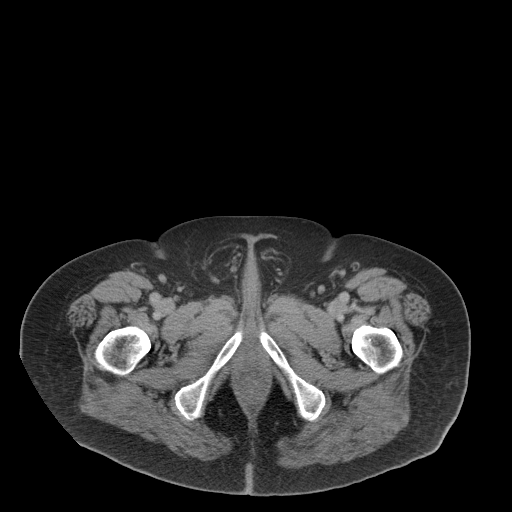
[im 7/100  bone]
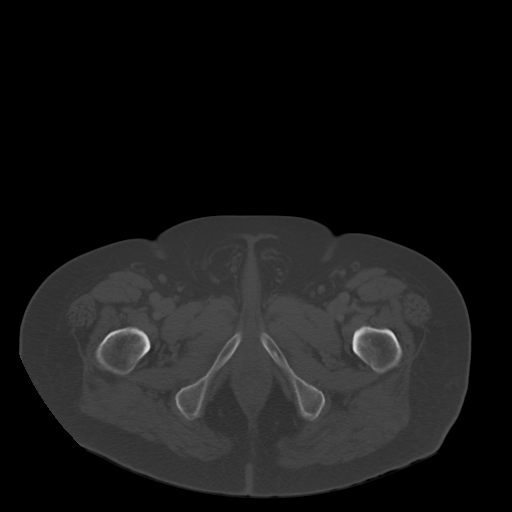
[im 14/100  soft-tissue]
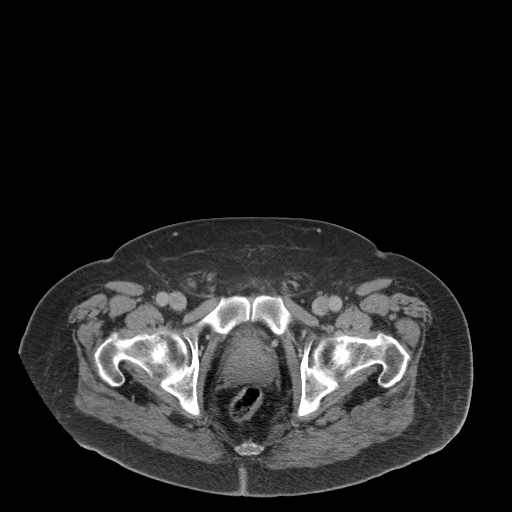
[im 20/100  soft-tissue]
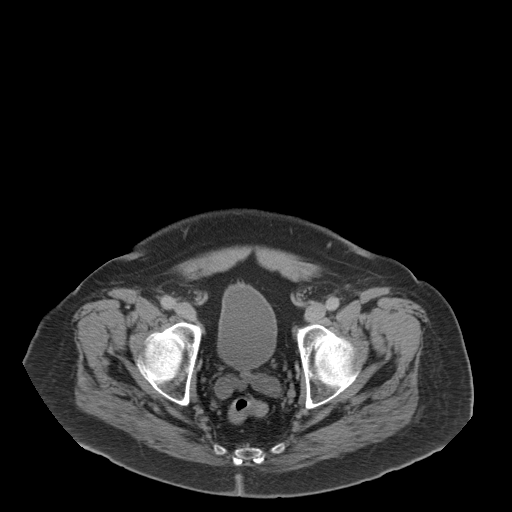
[im 27/100  soft-tissue]
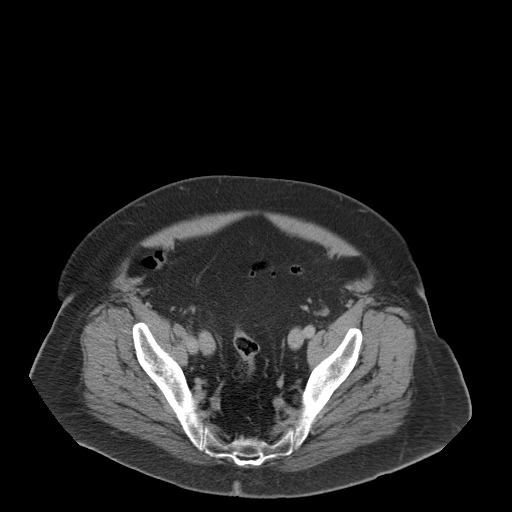
[im 34/100  soft-tissue]
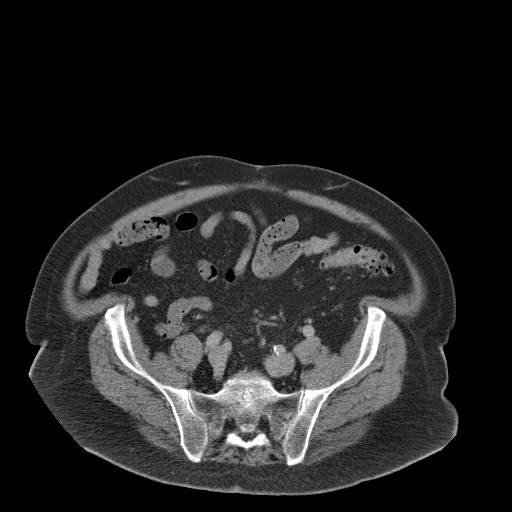
[im 40/100  soft-tissue]
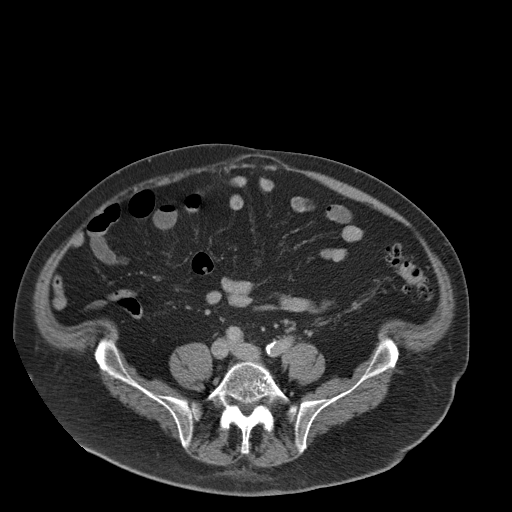
[im 53/100  soft-tissue]
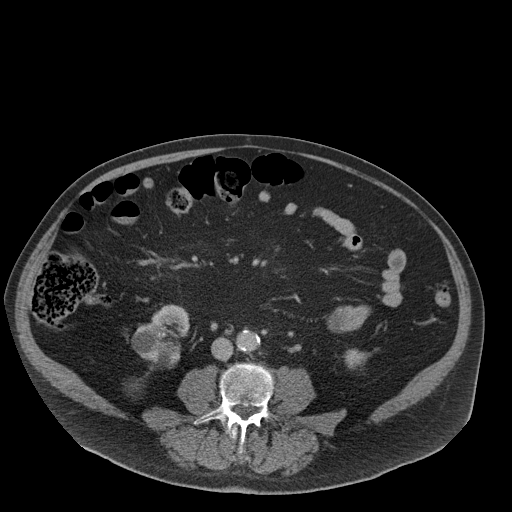
[im 60/100  soft-tissue]
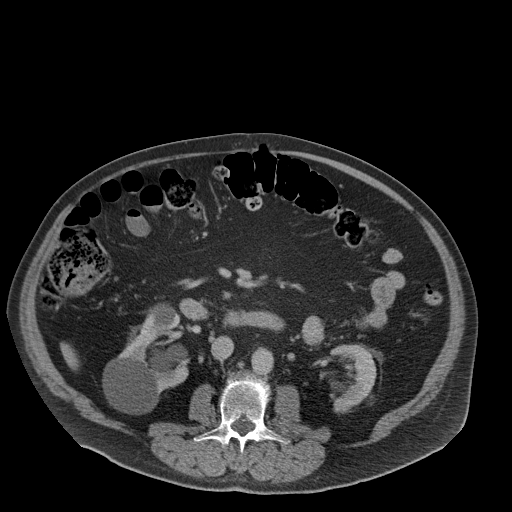
[im 67/100  soft-tissue]
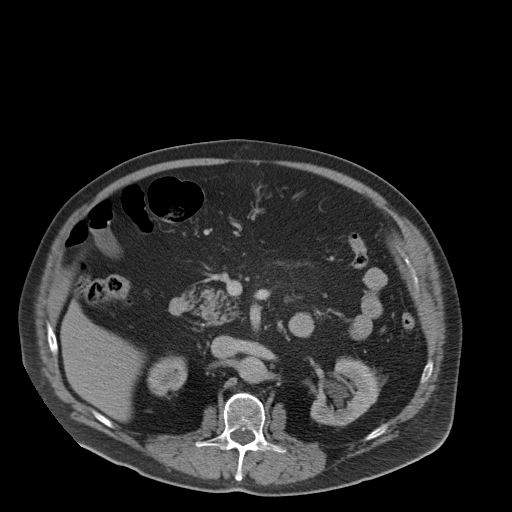
[im 67/100  bone]
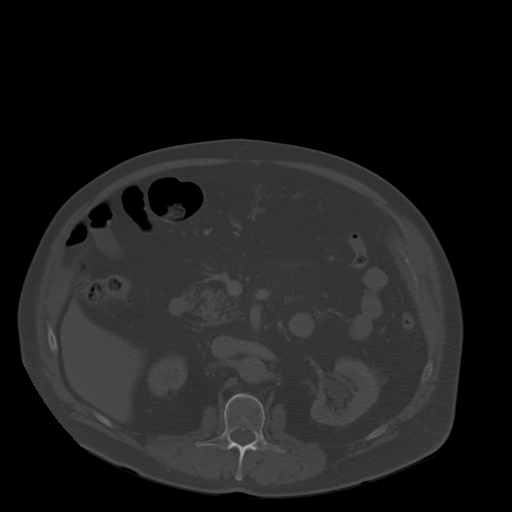
[im 73/100  soft-tissue]
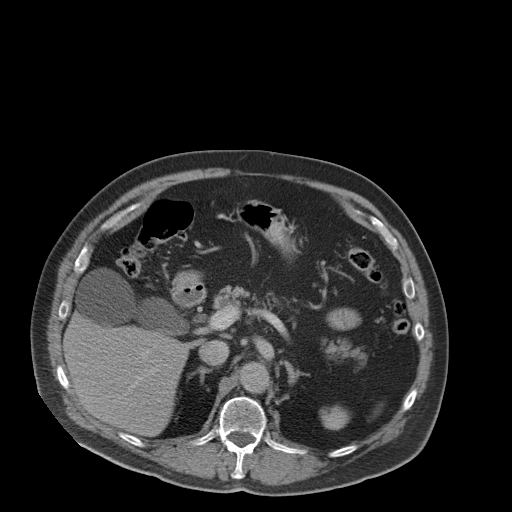
[im 80/100  soft-tissue]
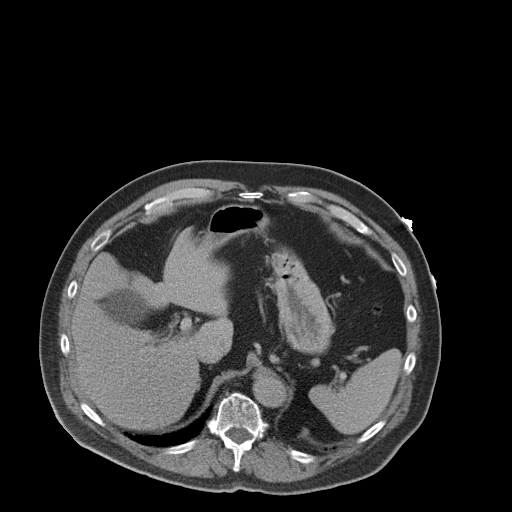
[im 86/100  soft-tissue]
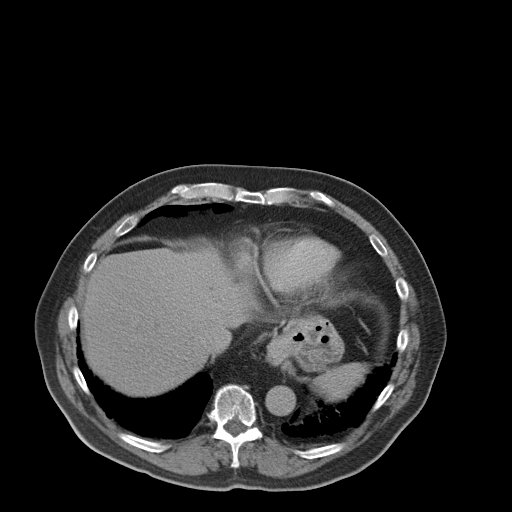
[im 93/100  soft-tissue]
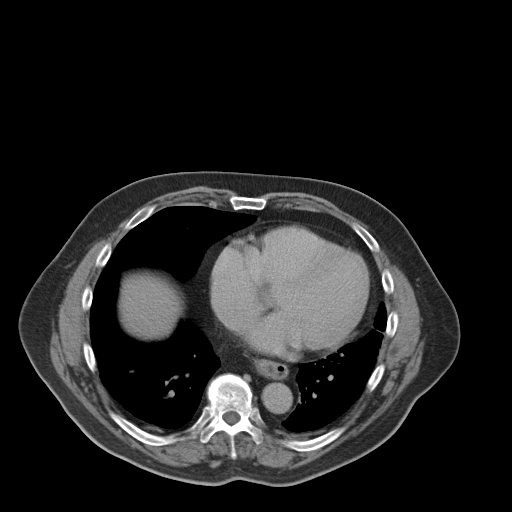

[Series 4: coronal st · coronal · 0.88mm/px · 3 of 109 slices shown]
[im 37/109  soft-tissue]
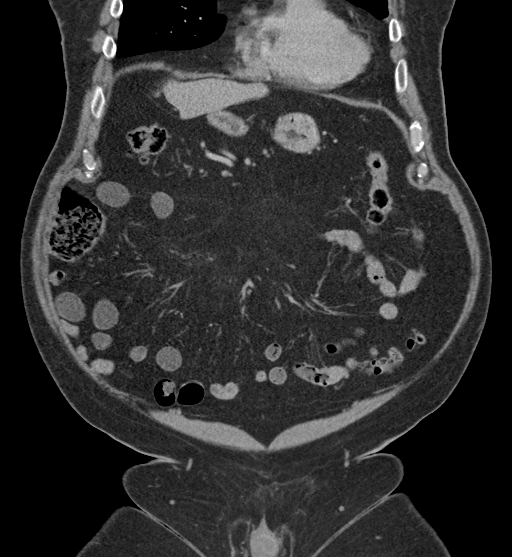
[im 49/109  soft-tissue]
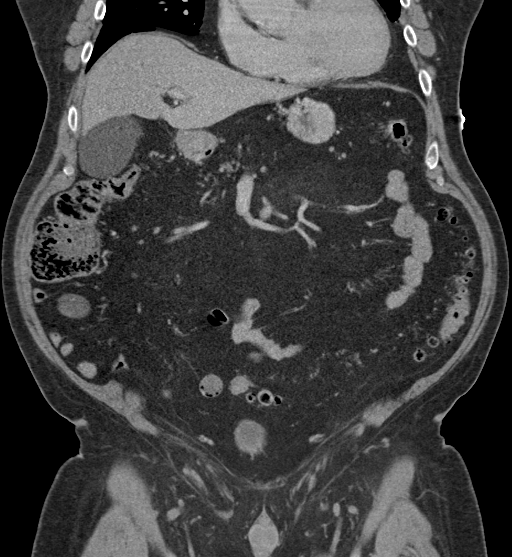
[im 61/109  soft-tissue]
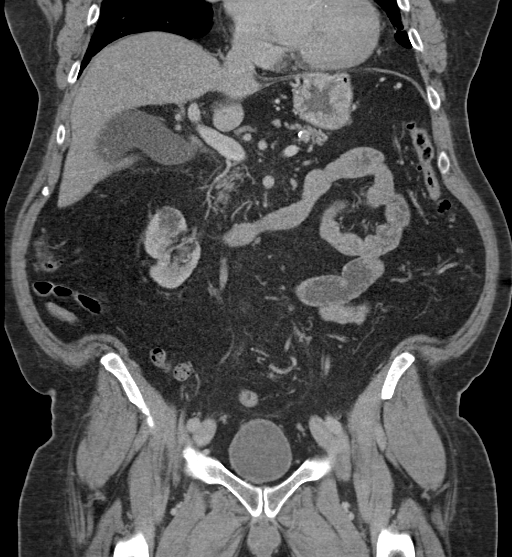

[16 of 46 positions shown; findings below may reference images not displayed]

FINDINGS: Lower chest: Scattered bibasilar atelectatic changes. Visualized
lungs are otherwise clear.

Hepatobiliary: Several scattered hepatic cysts noted. Liver
otherwise unremarkable. Gallbladder within normal limits. No biliary
dilatation.

Pancreas: Fatty infiltration the pancreas noted. Pancreas otherwise
unremarkable.

Spleen: Spleen within normal limits.

Adrenals/Urinary Tract: Adrenal glands normal. Kidneys fairly equal
in size with symmetric enhancement. Multiple scattered renal cysts
noted. 6 mm nonobstructive right renal calculus. 3 mm nonobstructive
stone within the lower pole left kidney. No hydronephrosis. No focal
enhancing renal mass. No hydroureter. Partially distended bladder
within normal limits.

Stomach/Bowel: Small hiatal hernia noted. Stomach otherwise
unremarkable. No evidence for bowel obstruction. Normal appendix.
Colonic diverticulosis without evidence for acute diverticulitis. No
acute inflammatory changes seen about the bowels. Gisel [place] type
hernia containing a portion of the transverse colon noted at the mid
abdomen (series 2, image 42). No associated inflammation.

Vascular/Lymphatic: Moderate aorto bi-iliac atherosclerotic disease.
No aneurysm. Mesenteric vessels patent proximally. No adenopathy.

Reproductive: Prostate normal.

Other: Small bilateral fat containing inguinal hernias. No free air
or fluid.

Musculoskeletal: No acute osseus abnormality. No worrisome lytic or
blastic osseous lesions.
IMPRESSION: 1. No CT evidence for acute intra-abdominal or pelvic process.
2. Colonic diverticulosis without evidence for acute diverticulitis.
3. Bilateral nonobstructive nephrolithiasis.
4. Gisel [place] type hernia containing a portion of the transverse
colon at the anterior mid abdomen without associated inflammation.
5. Atherosclerosis.

## 2019-08-14 ENCOUNTER — Other Ambulatory Visit: Payer: Self-pay | Admitting: Nurse Practitioner

## 2019-08-22 ENCOUNTER — Other Ambulatory Visit: Payer: Self-pay | Admitting: Nurse Practitioner

## 2019-09-13 ENCOUNTER — Encounter: Payer: Self-pay | Admitting: Internal Medicine

## 2019-09-14 ENCOUNTER — Non-Acute Institutional Stay: Payer: Medicare PPO | Admitting: Nurse Practitioner

## 2019-09-14 ENCOUNTER — Other Ambulatory Visit: Payer: Self-pay

## 2019-09-14 ENCOUNTER — Encounter: Payer: Self-pay | Admitting: Nurse Practitioner

## 2019-09-14 DIAGNOSIS — M10071 Idiopathic gout, right ankle and foot: Secondary | ICD-10-CM | POA: Diagnosis not present

## 2019-09-14 DIAGNOSIS — G4730 Sleep apnea, unspecified: Secondary | ICD-10-CM | POA: Diagnosis not present

## 2019-09-14 DIAGNOSIS — G43109 Migraine with aura, not intractable, without status migrainosus: Secondary | ICD-10-CM | POA: Diagnosis not present

## 2019-09-14 DIAGNOSIS — I824Y2 Acute embolism and thrombosis of unspecified deep veins of left proximal lower extremity: Secondary | ICD-10-CM

## 2019-09-14 DIAGNOSIS — I10 Essential (primary) hypertension: Secondary | ICD-10-CM | POA: Diagnosis not present

## 2019-09-14 DIAGNOSIS — R9389 Abnormal findings on diagnostic imaging of other specified body structures: Secondary | ICD-10-CM | POA: Insufficient documentation

## 2019-09-14 DIAGNOSIS — R7303 Prediabetes: Secondary | ICD-10-CM

## 2019-09-14 DIAGNOSIS — E782 Mixed hyperlipidemia: Secondary | ICD-10-CM | POA: Diagnosis not present

## 2019-09-14 DIAGNOSIS — K2270 Barrett's esophagus without dysplasia: Secondary | ICD-10-CM | POA: Diagnosis not present

## 2019-09-14 DIAGNOSIS — I2699 Other pulmonary embolism without acute cor pulmonale: Secondary | ICD-10-CM

## 2019-09-14 DIAGNOSIS — I8290 Acute embolism and thrombosis of unspecified vein: Secondary | ICD-10-CM | POA: Diagnosis not present

## 2019-09-14 DIAGNOSIS — J309 Allergic rhinitis, unspecified: Secondary | ICD-10-CM | POA: Insufficient documentation

## 2019-09-14 NOTE — Assessment & Plan Note (Signed)
Prn Imitrex is adequate.

## 2019-09-14 NOTE — Assessment & Plan Note (Signed)
Slightly swelling in LLE, continue Eliquis.

## 2019-09-14 NOTE — Assessment & Plan Note (Addendum)
Hx of DVT/PE since 01/2019,  continue Eliquis.

## 2019-09-14 NOTE — Assessment & Plan Note (Signed)
Stable, continue Omeprazole.  

## 2019-09-14 NOTE — Assessment & Plan Note (Addendum)
Stable, continue Allopurinol. Update Uric acid.

## 2019-09-14 NOTE — Assessment & Plan Note (Signed)
CPAP at night, not consistent with its using due to the mask and moist.

## 2019-09-14 NOTE — Assessment & Plan Note (Signed)
Diet controlled, update Hgb a1c.

## 2019-09-14 NOTE — Patient Instructions (Signed)
Monitor for s/s of respiratory symptoms: cough, chest pain/pressure, palpitation, SOB, or fever. Obtain labs prior to the appointment in 4 weeks.

## 2019-09-14 NOTE — Assessment & Plan Note (Addendum)
Cough, scan phlegm, some wheezes in the evening, since PE, will monitor for s/s of respiratory symptoms, may consider CXR, the patient declined bronchodilator inhaler, or cough suppressant. Observe. Continue Eliquis.

## 2019-09-14 NOTE — Assessment & Plan Note (Signed)
02/20/19  CXR in ED showed bibasilar infiltrate, CTA PE in RLL, small R effusion,

## 2019-09-14 NOTE — Assessment & Plan Note (Addendum)
Blood pressure is controlled, continue Verapamil. Update CMP/eGFR, Lipid panel

## 2019-09-14 NOTE — Assessment & Plan Note (Signed)
Stable, continue Flonase.  

## 2019-09-14 NOTE — Assessment & Plan Note (Signed)
LDL 128 04/10/19, update lipid panel.

## 2019-09-14 NOTE — Progress Notes (Signed)
Location:   clinic [place]   Place of Service:  Clinic (12) Provider: Marlana Latus NP  Code Status: DNR Goals of Care: [place] 03/17/2019  Does Patient Have a Medical Advance Directive? No;Yes  Type of Advance Directive Living will  Does patient want to make changes to medical advance directive? No - Patient declined  Copy of [place] in Chart? -  Would patient like information on creating a medical advance directive? No - Patient declined     Chief Complaint  Patient presents with  . Medical Management of Chronic Issues    Follow Up. Patient would like to discuss his eliquis.     HPI: Patient is a 85 y.o. male seen today for evaluation of chronic medical conditions.   Hx of Gout, stable on Allopurinol. Hx of DVT/LLE at the level of the mid and distal femoral vein, popliteal veins, and posterior tibial vein /acute PE-SOB/chest pain 02/20/19, CXR in ED showed bibasilar infiltrate, CTA PE in RLL, small R effusion, on Eliquis 23m bid, Echo EF 45-50%.  Allergic rhinitis, stable on Flonase. GERD, stable, on Omeprazole. HTN, blood pressure is controlled on Verapamil. Migraine, stable. On Imitrex prn.   Past Medical History:  Diagnosis Date  . Apnea, sleep   . Barrett's esophagus   . Gout   . Hypertension   . Obesity     Past Surgical History:  Procedure Laterality Date  . CHOLECYSTECTOMY  08/29/2017  . EYE SURGERY Bilateral 2011-2012   catracts removed  . HERNIA REPAIR  2005   T. TCarlton AdamMD  . TONSILECTOMY/ADENOIDECTOMY WITH MYRINGOTOMY  [id number]   Allergies  Allergen Reactions  . Penicillins     Has patient had a PCN reaction causing immediate rash, facial/tongue/throat swelling, SOB or lightheadedness with hypotension: yes, rash Has patient had a PCN reaction causing severe rash involving mucus membranes or skin necrosis: no Has patient had a PCN reaction that required hospitalization: no Has patient had a PCN reaction occurring within the  last 10 years: No If all of the above answers are "NO", then may proceed with Cephalosporin use.     Allergies as of 09/14/2019      Reactions   Penicillins    Has patient had a PCN reaction causing immediate rash, facial/tongue/throat swelling, SOB or lightheadedness with hypotension: yes, rash Has patient had a PCN reaction causing severe rash involving mucus membranes or skin necrosis: no Has patient had a PCN reaction that required hospitalization: no Has patient had a PCN reaction occurring within the last 10 years: No If all of the above answers are "NO", then may proceed with Cephalosporin use.      Medication List       Accurate as of September 14, 2019  4:33 PM. If you have any questions, ask your nurse or doctor.        allopurinol 100 MG tablet Commonly known as: ZYLOPRIM TAKE 1 TABLET BY MOUTH TWICE DAILY.   Eliquis 5 MG Tabs tablet Generic drug: apixaban Take 5 mg by mouth 2 (two) times a day.   fluticasone 50 MCG/ACT nasal spray Commonly known as: FLONASE Place 1 spray into both nostrils daily.   latanoprost 0.005 % ophthalmic solution Commonly known as: XALATAN Place 1 drop into both eyes at bedtime.   multivitamin with minerals tablet Take 1 tablet by mouth daily.   omeprazole 20 MG capsule Commonly known as: PRILOSEC TAKE (1) CAPSULE DAILY.   SUMAtriptan 50 MG tablet Commonly  known as: IMITREX Take 50 mg by mouth as needed.   verapamil 180 MG CR tablet Commonly known as: CALAN-SR TAKE ONE TABLET AT BEDTIME.       Review of Systems:  Review of Systems  Constitutional: Negative for activity change, appetite change, chills, diaphoresis, fatigue, fever and unexpected weight change.  HENT: Positive for hearing loss, postnasal drip and rhinorrhea. Negative for congestion, sinus pressure, sinus pain, sore throat, trouble swallowing and voice change.   Eyes: Negative for visual disturbance.  Respiratory: Positive for cough and wheezing. Negative for  chest tightness and shortness of breath.        Occasional cough, phlegm sometimes, occasionally noted greenish color  Cardiovascular: Positive for leg swelling. Negative for chest pain and palpitations.  Gastrointestinal: Negative for abdominal distention, abdominal pain, constipation, diarrhea, nausea and vomiting.  Genitourinary: Negative for difficulty urinating, dysuria and urgency.  Musculoskeletal: Positive for arthralgias and gait problem.  Skin: Negative for color change and pallor.  Neurological: Negative for dizziness, speech difficulty, weakness and headaches.  Psychiatric/Behavioral: Negative for agitation, behavioral problems, hallucinations and sleep disturbance. The patient is not nervous/anxious.     Health Maintenance  Topic Date Due  . TETANUS/TDAP  07/31/2026  . INFLUENZA VACCINE  Completed  . PNA vac Low Risk Adult  Completed    Physical Exam: Vitals:   09/14/19 1543  BP: 138/80  Pulse: 82  Temp: 97.8 F (36.6 C)  SpO2: 96%  Weight: 254 lb 3.2 oz (115.3 kg)  Height: 5' 11"  (1.803 m)   Body mass index is 35.45 kg/m. Physical Exam Vitals and nursing note reviewed.  Constitutional:      General: He is not in acute distress.    Appearance: Normal appearance. He is not ill-appearing, toxic-appearing or diaphoretic.  HENT:     Head: Normocephalic and atraumatic.     Nose: Nose normal.     Mouth/Throat:     Mouth: Mucous membranes are moist.  Eyes:     Extraocular Movements: Extraocular movements intact.     Conjunctiva/sclera: Conjunctivae normal.     Pupils: Pupils are equal, round, and reactive to light.  Cardiovascular:     Rate and Rhythm: Normal rate and regular rhythm.     Heart sounds: No murmur.  Pulmonary:     Breath sounds: Rales present.     Comments: Bibasilar rales.  Abdominal:     General: Bowel sounds are normal. There is no distension.     Palpations: Abdomen is soft.     Tenderness: There is no abdominal tenderness. There is no  right CVA tenderness, left CVA tenderness, guarding or rebound.  Musculoskeletal:     Cervical back: Normal range of motion and neck supple.     Right lower leg: No edema.     Left lower leg: Edema present.  Skin:    General: Skin is warm and dry.  Neurological:     General: No focal deficit present.     Mental Status: He is alert and oriented to person, place, and time. Mental status is at baseline.     Coordination: Coordination normal.     Gait: Gait abnormal.  Psychiatric:        Mood and Affect: Mood normal.        Behavior: Behavior normal.        Thought Content: Thought content normal.        Judgment: Judgment normal.     Labs reviewed: Basic Metabolic Panel: Recent Labs  04/18/19 0730  NA 138  K 4.0  CL 105  CO2 24  GLUCOSE 114*  BUN 13  CREATININE 1.05  CALCIUM 9.1  TSH 2.22   Liver Function Tests: Recent Labs    04/18/19 0730  AST 17  ALT 13  BILITOT 0.5  PROT 7.0   No results for input(s): LIPASE, AMYLASE in the last 8760 hours. No results for input(s): AMMONIA in the last 8760 hours. CBC: Recent Labs    04/18/19 0730  WBC 8.0  NEUTROABS 3,736  HGB 14.9  HCT 44.7  MCV 91.6  PLT 257   Lipid Panel: Recent Labs    04/18/19 0730  CHOL 209*  HDL 42  LDLCALC 136*  TRIG 173*  CHOLHDL 5.0*   Lab Results  Component Value Date   HGBA1C 5.9 (H) 04/18/2019    Procedures since last visit: No results found.  Assessment/Plan Gout of ankle Stable, continue Allopurinol. Update Uric acid.   VTE (venous thromboembolism) Hx of DVT/PE since 01/2019,  continue Eliquis.   Hypertension Blood pressure is controlled, continue Verapamil. Update CMP/eGFR, Lipid panel  Acute deep vein thrombosis (DVT) of left lower extremity (HCC) Slightly swelling in LLE, continue Eliquis.   Acute pulmonary embolism without acute cor pulmonale (HCC) Cough, scan phlegm, some wheezes in the evening, since PE, will monitor for s/s of respiratory symptoms, may  consider CXR, the patient declined bronchodilator inhaler, or cough suppressant. Observe. Continue Eliquis.   Migraine headache with aura Prn Imitrex is adequate.   Sleep apnea in adult CPAP at night, not consistent with its using due to the mask and moist.   Barrett's esophagus Stable, continue Omeprazole.   Hyperlipidemia LDL 128 04/10/19, update lipid panel.   Prediabetes Diet controlled, update Hgb a1c.   Allergic rhinitis Stable, continue Flonase.   Abnormal chest x-ray 02/20/19  CXR in ED showed bibasilar infiltrate, CTA PE in RLL, small R effusion,    Labs/tests ordered: CBC/diff, CMP/eGFR, lipid panel, Hgb a1c, uric acid.   Next appt: 4 weeks

## 2019-09-18 DIAGNOSIS — Z1211 Encounter for screening for malignant neoplasm of colon: Secondary | ICD-10-CM | POA: Diagnosis not present

## 2019-09-18 DIAGNOSIS — Z7901 Long term (current) use of anticoagulants: Secondary | ICD-10-CM | POA: Diagnosis not present

## 2019-09-18 DIAGNOSIS — Z79899 Other long term (current) drug therapy: Secondary | ICD-10-CM | POA: Diagnosis not present

## 2019-09-18 DIAGNOSIS — I2782 Chronic pulmonary embolism: Secondary | ICD-10-CM | POA: Diagnosis not present

## 2019-09-18 DIAGNOSIS — I82512 Chronic embolism and thrombosis of left femoral vein: Secondary | ICD-10-CM | POA: Diagnosis not present

## 2019-09-18 DIAGNOSIS — Z87891 Personal history of nicotine dependence: Secondary | ICD-10-CM | POA: Diagnosis not present

## 2019-10-05 ENCOUNTER — Other Ambulatory Visit: Payer: Self-pay

## 2019-10-05 DIAGNOSIS — M10071 Idiopathic gout, right ankle and foot: Secondary | ICD-10-CM

## 2019-10-05 DIAGNOSIS — R7303 Prediabetes: Secondary | ICD-10-CM | POA: Diagnosis not present

## 2019-10-05 DIAGNOSIS — I10 Essential (primary) hypertension: Secondary | ICD-10-CM

## 2019-10-05 DIAGNOSIS — E782 Mixed hyperlipidemia: Secondary | ICD-10-CM | POA: Diagnosis not present

## 2019-10-05 DIAGNOSIS — J309 Allergic rhinitis, unspecified: Secondary | ICD-10-CM | POA: Diagnosis not present

## 2019-10-06 LAB — LIPID PANEL
Cholesterol: 223 mg/dL — ABNORMAL HIGH (ref <200)
HDL: 43 mg/dL (ref >=40)
LDL Cholesterol (Calc): 152 mg/dL (calc) — ABNORMAL HIGH
Non-HDL Cholesterol (Calc): 180 mg/dL (calc) — ABNORMAL HIGH (ref <130)
Total CHOL/HDL Ratio: 5.2 (calc) — ABNORMAL HIGH (ref <5.0)
Triglycerides: 153 mg/dL — ABNORMAL HIGH (ref <150)

## 2019-10-06 LAB — CBC WITH DIFFERENTIAL/PLATELET
Absolute Monocytes: 710 cells/uL (ref 200–950)
Basophils Absolute: 46 cells/uL (ref 0–200)
Basophils Relative: 0.5 %
Eosinophils Absolute: 291 cells/uL (ref 15–500)
Eosinophils Relative: 3.2 %
HCT: 44.3 % (ref 38.5–50.0)
Hemoglobin: 15 g/dL (ref 13.2–17.1)
Lymphs Abs: 4186 cells/uL — ABNORMAL HIGH (ref 850–3900)
MCH: 30.7 pg (ref 27.0–33.0)
MCHC: 33.9 g/dL (ref 32.0–36.0)
MCV: 90.6 fL (ref 80.0–100.0)
MPV: 10.6 fL (ref 7.5–12.5)
Monocytes Relative: 7.8 %
Neutro Abs: 3868 cells/uL (ref 1500–7800)
Neutrophils Relative %: 42.5 %
Platelets: 262 10*3/uL (ref 140–400)
RBC: 4.89 10*6/uL (ref 4.20–5.80)
RDW: 13.6 % (ref 11.0–15.0)
Total Lymphocyte: 46 %
WBC: 9.1 10*3/uL (ref 3.8–10.8)

## 2019-10-06 LAB — COMPLETE METABOLIC PANEL WITH GFR
AG Ratio: 1.3 (calc) (ref 1.0–2.5)
ALT: 13 U/L (ref 9–46)
AST: 16 U/L (ref 10–35)
Albumin: 4 g/dL (ref 3.6–5.1)
Alkaline phosphatase (APISO): 80 U/L (ref 35–144)
BUN: 16 mg/dL (ref 7–25)
CO2: 25 mmol/L (ref 20–32)
Calcium: 9.1 mg/dL (ref 8.6–10.3)
Chloride: 104 mmol/L (ref 98–110)
Creat: 1.11 mg/dL (ref 0.70–1.11)
GFR, Est African American: 71 mL/min/{1.73_m2} (ref >=60)
GFR, Est Non African American: 62 mL/min/{1.73_m2} (ref >=60)
Globulin: 3.2 g/dL (calc) (ref 1.9–3.7)
Glucose, Bld: 102 mg/dL — ABNORMAL HIGH (ref 65–99)
Potassium: 4.1 mmol/L (ref 3.5–5.3)
Sodium: 137 mmol/L (ref 135–146)
Total Bilirubin: 0.7 mg/dL (ref 0.2–1.2)
Total Protein: 7.2 g/dL (ref 6.1–8.1)

## 2019-10-06 LAB — HEMOGLOBIN A1C
Hgb A1c MFr Bld: 5.9 % of total Hgb — ABNORMAL HIGH (ref <5.7)
Mean Plasma Glucose: 123 (calc)
eAG (mmol/L): 6.8 (calc)

## 2019-10-06 LAB — URIC ACID: Uric Acid, Serum: 4.6 mg/dL (ref 4.0–8.0)

## 2019-10-12 ENCOUNTER — Encounter: Payer: Self-pay | Admitting: Nurse Practitioner

## 2019-10-26 ENCOUNTER — Other Ambulatory Visit: Payer: Self-pay

## 2019-10-26 ENCOUNTER — Non-Acute Institutional Stay: Payer: Medicare PPO | Admitting: Nurse Practitioner

## 2019-10-26 ENCOUNTER — Encounter: Payer: Self-pay | Admitting: Nurse Practitioner

## 2019-10-26 DIAGNOSIS — M79675 Pain in left toe(s): Secondary | ICD-10-CM | POA: Diagnosis not present

## 2019-10-26 DIAGNOSIS — I8290 Acute embolism and thrombosis of unspecified vein: Secondary | ICD-10-CM

## 2019-10-26 DIAGNOSIS — J309 Allergic rhinitis, unspecified: Secondary | ICD-10-CM | POA: Diagnosis not present

## 2019-10-26 DIAGNOSIS — L719 Rosacea, unspecified: Secondary | ICD-10-CM | POA: Insufficient documentation

## 2019-10-26 DIAGNOSIS — R059 Cough, unspecified: Secondary | ICD-10-CM

## 2019-10-26 DIAGNOSIS — I10 Essential (primary) hypertension: Secondary | ICD-10-CM

## 2019-10-26 DIAGNOSIS — M10071 Idiopathic gout, right ankle and foot: Secondary | ICD-10-CM

## 2019-10-26 DIAGNOSIS — K2270 Barrett's esophagus without dysplasia: Secondary | ICD-10-CM

## 2019-10-26 DIAGNOSIS — R05 Cough: Secondary | ICD-10-CM

## 2019-10-26 DIAGNOSIS — G43109 Migraine with aura, not intractable, without status migrainosus: Secondary | ICD-10-CM | POA: Diagnosis not present

## 2019-10-26 MED ORDER — PANTOPRAZOLE SODIUM 40 MG PO TBEC: 40.0000 mg | DELAYED_RELEASE_TABLET | Freq: Every day | ORAL | 3 refills | Status: DC

## 2019-10-26 NOTE — Patient Instructions (Signed)
F/u Dr. Gupta in 3 months.  

## 2019-10-26 NOTE — Assessment & Plan Note (Signed)
Stable, uric acid 4.6 10/06/19, continue Allopurinol.  

## 2019-10-26 NOTE — Progress Notes (Signed)
Location:   clinic [place]   Place of Service:  Clinic (12) Provider: Marlana Latus NP  Code Status: DNR Goals of Care: [place] 03/17/2019  Does Patient Have a Medical Advance Directive? No;Yes  Type of Advance Directive Living will  Does patient want to make changes to medical advance directive? No - Patient declined  Copy of [place] in Chart? -  Would patient like information on creating a medical advance directive? No - Patient declined     Chief Complaint  Patient presents with  . Medical Management of Chronic Issues    4 week follow up. Patient still having some congestion. He complains of having a sore left big toe.     HPI: Patient is a 85 y.o. male seen today for medical management of chronic diseases.    Congestion/cough, phlegm sometimes, mostly in am, sometime at night. He denied SOB, chest pain/pressure, or palpitation.    The patient has Hx of VTE, on Eliquis 2.5mg  bid. Gout, stable, on Allopurinol 100mg  qd. Allergic rhinitis, stable on Fluticasone nasal spray qd. GERD, stable, on Omeprazole 20mg  qd. HTN, blood pressure is controlled on Verapamil 180mg  qd. Migraine HA, stable on Imitrex 50mg  prn.    Past Medical History:  Diagnosis Date  . Apnea, sleep   . Barrett's esophagus   . Gout   . Hypertension   . Obesity     Past Surgical History:  Procedure Laterality Date  . CHOLECYSTECTOMY  08/29/2017  . EYE SURGERY Bilateral 2011-2012   catracts removed  . HERNIA REPAIR  2005   T. Carlton Adam MD  . TONSILECTOMY/ADENOIDECTOMY WITH MYRINGOTOMY  [id number]    Allergies  Allergen Reactions  . Penicillins     Has patient had a PCN reaction causing immediate rash, facial/tongue/throat swelling, SOB or lightheadedness with hypotension: yes, rash Has patient had a PCN reaction causing severe rash involving mucus membranes or skin necrosis: no Has patient had a PCN reaction that required hospitalization: no Has patient had a PCN reaction  occurring within the last 10 years: No If all of the above answers are "NO", then may proceed with Cephalosporin use.     Allergies as of 10/26/2019      Reactions   Penicillins    Has patient had a PCN reaction causing immediate rash, facial/tongue/throat swelling, SOB or lightheadedness with hypotension: yes, rash Has patient had a PCN reaction causing severe rash involving mucus membranes or skin necrosis: no Has patient had a PCN reaction that required hospitalization: no Has patient had a PCN reaction occurring within the last 10 years: No If all of the above answers are "NO", then may proceed with Cephalosporin use.      Medication List       Accurate as of October 26, 2019 11:59 PM. If you have any questions, ask your nurse or doctor.        STOP taking these medications   omeprazole 20 MG capsule Commonly known as: PRILOSEC Stopped by: Windi Toro X Sagar Tengan, NP     TAKE these medications   allopurinol 100 MG tablet Commonly known as: ZYLOPRIM TAKE 1 TABLET BY MOUTH TWICE DAILY.   Eliquis 5 MG Tabs tablet Generic drug: apixaban Take 2.5 mg by mouth 2 (two) times a day.   fluticasone 50 MCG/ACT nasal spray Commonly known as: FLONASE Place 1 spray into both nostrils daily.   latanoprost 0.005 % ophthalmic solution Commonly known as: XALATAN Place 1 drop into both eyes at bedtime.  multivitamin with minerals tablet Take 1 tablet by mouth daily.   pantoprazole 40 MG tablet Commonly known as: PROTONIX Take 1 tablet (40 mg total) by mouth daily. Started by: Carmaleta Youngers X Shireen Rayburn, NP   SUMAtriptan 50 MG tablet Commonly known as: IMITREX Take 50 mg by mouth as needed.   verapamil 180 MG CR tablet Commonly known as: CALAN-SR TAKE ONE TABLET AT BEDTIME.       Review of Systems:  Review of Systems  Constitutional: Negative for activity change, appetite change, chills, diaphoresis, fatigue and fever.  HENT: Positive for hearing loss, postnasal drip and rhinorrhea. Negative for  congestion, sinus pressure, sinus pain, sore throat, trouble swallowing and voice change.   Eyes: Negative for visual disturbance.  Respiratory: Positive for cough. Negative for chest tightness, shortness of breath and wheezing.        Congestion/cough, phlegm sometimes, mostly in am, sometime at night  Cardiovascular: Positive for leg swelling. Negative for chest pain and palpitations.  Gastrointestinal: Negative for abdominal distention, abdominal pain, constipation, diarrhea, nausea and vomiting.  Genitourinary: Negative for difficulty urinating, dysuria and urgency.  Musculoskeletal: Positive for arthralgias and gait problem.  Skin: Negative for color change and pallor.       Rosacea nose. Left great toe soreness at night.   Neurological: Negative for dizziness, speech difficulty, weakness and headaches.  Psychiatric/Behavioral: Negative for agitation, behavioral problems, hallucinations and sleep disturbance. The patient is not nervous/anxious.     Health Maintenance  Topic Date Due  . TETANUS/TDAP  07/31/2026  . INFLUENZA VACCINE  Completed  . PNA vac Low Risk Adult  Completed    Physical Exam: Vitals:   10/26/19 1452  BP: 136/80  Pulse: 75  Temp: (!) 97.5 F (36.4 C)  SpO2: 96%  Weight: 241 lb (109.3 kg)  Height: 5\' 11"  (1.803 m)   Body mass index is 33.61 kg/m. Physical Exam Vitals and nursing note reviewed.  Constitutional:      General: He is not in acute distress.    Appearance: Normal appearance. He is not ill-appearing, toxic-appearing or diaphoretic.  HENT:     Head: Normocephalic and atraumatic.     Nose: Nose normal.     Mouth/Throat:     Mouth: Mucous membranes are moist.  Eyes:     Extraocular Movements: Extraocular movements intact.     Conjunctiva/sclera: Conjunctivae normal.     Pupils: Pupils are equal, round, and reactive to light.  Cardiovascular:     Rate and Rhythm: Normal rate and regular rhythm.     Heart sounds: No murmur.     Comments:  Weak DP pulses Pulmonary:     Breath sounds: Rales present.     Comments: Bibasilar rales.  Abdominal:     General: Bowel sounds are normal. There is no distension.     Palpations: Abdomen is soft.     Tenderness: There is no abdominal tenderness. There is no right CVA tenderness, left CVA tenderness, guarding or rebound.  Musculoskeletal:     Cervical back: Normal range of motion and neck supple.     Right lower leg: No edema.     Left lower leg: Edema present.  Skin:    General: Skin is warm and dry.     Comments: Dark redness in toes, Left great toe>R great toe, better after elevation. rosacea nose  Neurological:     General: No focal deficit present.     Mental Status: He is alert and oriented to person, place, and  time. Mental status is at baseline.     Coordination: Coordination normal.     Gait: Gait abnormal.  Psychiatric:        Mood and Affect: Mood normal.        Behavior: Behavior normal.        Thought Content: Thought content normal.        Judgment: Judgment normal.     Labs reviewed: Basic Metabolic Panel: Recent Labs    04/18/19 0730 10/05/19 0700  NA 138 137  K 4.0 4.1  CL 105 104  CO2 24 25  GLUCOSE 114* 102*  BUN 13 16  CREATININE 1.05 1.11  CALCIUM 9.1 9.1  TSH 2.22  --    Liver Function Tests: Recent Labs    04/18/19 0730 10/05/19 0700  AST 17 16  ALT 13 13  BILITOT 0.5 0.7  PROT 7.0 7.2   No results for input(s): LIPASE, AMYLASE in the last 8760 hours. No results for input(s): AMMONIA in the last 8760 hours. CBC: Recent Labs    04/18/19 0730 10/05/19 0700  WBC 8.0 9.1  NEUTROABS 3,736 3,868  HGB 14.9 15.0  HCT 44.7 44.3  MCV 91.6 90.6  PLT 257 262   Lipid Panel: Recent Labs    04/18/19 0730 10/05/19 0700  CHOL 209* 223*  HDL 42 43  LDLCALC 136* 152*  TRIG 173* 153*  CHOLHDL 5.0* 5.2*   Lab Results  Component Value Date   HGBA1C 5.9 (H) 10/05/2019    Procedures since last visit: No results  found.  Assessment/Plan  VTE (venous thromboembolism) Chronic anticoagulation, continue Eliquis.   Hypertension Blood pressure is controlled, continue Verapamil .  Migraine headache with aura Stable, less intensive and frequency of attacks, prn Imitrex if effective.   Allergic rhinitis Stable, continue daily Fluticasone nasal spray.   Barrett's esophagus Cough in am? Will switch to Pantoprazole 40mg  qd/Omerazole, observe, underwent GI evaluation in the past, biopsy,  Due repeat endoscopy for dysplasia surveillance on 2021  Gout of ankle Stable, uric acid 4.6 10/06/19, continue Allopurinol.   Cough More in the morning, sometimes at night, denied SOB, chest pain/pressure, or palpitation. GERD vs respiratory symptoms? Will change to Pantoprazole from Omeprazole, CXR if no better, prn Mucinex/Claritin.   Great toe pain, left No trauma, warmth, s/s of infection, or flare up gout, pain is more at night or elevation of the foot, lighter color when elevated, weak DP, ? PAD, will apply OTC BioFreeze, observe.   Rosacea Nose, uses MOM self.    Labs/tests ordered:  None  Next appt:  F/u Dr. Lyndel Safe in 3 months.

## 2019-10-26 NOTE — Assessment & Plan Note (Signed)
Nose, uses MOM self.

## 2019-10-26 NOTE — Assessment & Plan Note (Addendum)
Cough in am? Will switch to Pantoprazole 40mg  qd/Omerazole, observe, underwent GI evaluation in the past, biopsy,  Due repeat endoscopy for dysplasia surveillance on 2021

## 2019-10-26 NOTE — Assessment & Plan Note (Signed)
More in the morning, sometimes at night, denied SOB, chest pain/pressure, or palpitation. GERD vs respiratory symptoms? Will change to Pantoprazole from Omeprazole, CXR if no better, prn Mucinex/Claritin.

## 2019-10-26 NOTE — Assessment & Plan Note (Signed)
No trauma, warmth, s/s of infection, or flare up gout, pain is more at night or elevation of the foot, lighter color when elevated, weak DP, ? PAD, will apply OTC BioFreeze, observe.

## 2019-10-26 NOTE — Assessment & Plan Note (Signed)
Chronic anticoagulation, continue Eliquis.

## 2019-10-26 NOTE — Assessment & Plan Note (Signed)
Stable, less intensive and frequency of attacks, prn Imitrex if effective.

## 2019-10-26 NOTE — Assessment & Plan Note (Signed)
Blood pressure is controlled, continue Verapamil.  

## 2019-10-26 NOTE — Assessment & Plan Note (Signed)
Stable, continue daily Fluticasone nasal spray.

## 2019-10-27 ENCOUNTER — Encounter: Payer: Self-pay | Admitting: Nurse Practitioner

## 2019-12-08 ENCOUNTER — Other Ambulatory Visit: Payer: Self-pay

## 2019-12-08 MED ORDER — SUMATRIPTAN SUCCINATE 50 MG PO TABS: 50.0000 mg | ORAL_TABLET | ORAL | 0 refills | Status: DC | PRN

## 2019-12-08 NOTE — Telephone Encounter (Signed)
Patient called and left a voicemail for this medication to be refilled. Medication pend and sent to provider to change dosage and quantity if needed. Please Advise.

## 2020-01-26 ENCOUNTER — Encounter: Payer: Self-pay | Admitting: Internal Medicine

## 2020-02-06 DIAGNOSIS — H401131 Primary open-angle glaucoma, bilateral, mild stage: Secondary | ICD-10-CM | POA: Diagnosis not present

## 2020-02-09 ENCOUNTER — Other Ambulatory Visit: Payer: Self-pay | Admitting: Nurse Practitioner

## 2020-02-09 NOTE — Telephone Encounter (Signed)
Medication pend for upcoming appointment 02/15/2020. Incase adjustments need to be made.

## 2020-02-15 ENCOUNTER — Other Ambulatory Visit: Payer: Self-pay

## 2020-02-15 ENCOUNTER — Non-Acute Institutional Stay: Payer: Medicare PPO | Admitting: Nurse Practitioner

## 2020-02-15 ENCOUNTER — Encounter: Payer: Self-pay | Admitting: Nurse Practitioner

## 2020-02-15 VITALS — BP 136/84 | HR 84 | Temp 98.4°F | Ht 71.0 in | Wt 244.8 lb

## 2020-02-15 DIAGNOSIS — J440 Chronic obstructive pulmonary disease with acute lower respiratory infection: Secondary | ICD-10-CM

## 2020-02-15 DIAGNOSIS — R058 Other specified cough: Secondary | ICD-10-CM | POA: Insufficient documentation

## 2020-02-15 DIAGNOSIS — J309 Allergic rhinitis, unspecified: Secondary | ICD-10-CM | POA: Diagnosis not present

## 2020-02-15 DIAGNOSIS — I129 Hypertensive chronic kidney disease with stage 1 through stage 4 chronic kidney disease, or unspecified chronic kidney disease: Secondary | ICD-10-CM | POA: Diagnosis not present

## 2020-02-15 DIAGNOSIS — E782 Mixed hyperlipidemia: Secondary | ICD-10-CM

## 2020-02-15 DIAGNOSIS — I10 Essential (primary) hypertension: Secondary | ICD-10-CM

## 2020-02-15 DIAGNOSIS — I8290 Acute embolism and thrombosis of unspecified vein: Secondary | ICD-10-CM

## 2020-02-15 DIAGNOSIS — J209 Acute bronchitis, unspecified: Secondary | ICD-10-CM | POA: Diagnosis not present

## 2020-02-15 DIAGNOSIS — M10071 Idiopathic gout, right ankle and foot: Secondary | ICD-10-CM | POA: Diagnosis not present

## 2020-02-15 DIAGNOSIS — N182 Chronic kidney disease, stage 2 (mild): Secondary | ICD-10-CM

## 2020-02-15 DIAGNOSIS — K2270 Barrett's esophagus without dysplasia: Secondary | ICD-10-CM

## 2020-02-15 MED ORDER — AZITHROMYCIN 250 MG PO TABS: ORAL_TABLET | ORAL | 0 refills | Status: DC

## 2020-02-15 NOTE — Assessment & Plan Note (Signed)
Nasal congestion, continue Flonase

## 2020-02-15 NOTE — Assessment & Plan Note (Signed)
Stable, uric acid 4.6 10/06/19, continue Allopurinol.

## 2020-02-15 NOTE — Assessment & Plan Note (Signed)
Will update lipid panel prior to the next appointment, LDL is not at goal, no taking statin.

## 2020-02-15 NOTE — Assessment & Plan Note (Signed)
Will update CMP/eGFR prior to the next appointment.  

## 2020-02-15 NOTE — Assessment & Plan Note (Addendum)
Hx, chronic Eliquis 2.5mg  bid. Update CBC/diff prior to the next appointment.

## 2020-02-15 NOTE — Patient Instructions (Addendum)
Obtain labs prior to the next appointment with Dr. Lyndel Safe in 3-4 months for routine follow up. Call back if cough and greenish phlegm are not getting better.

## 2020-02-15 NOTE — Assessment & Plan Note (Signed)
Stable, continue Pantoprazole.  

## 2020-02-15 NOTE — Assessment & Plan Note (Signed)
Blood pressure is controlled, continue Verapamil.  

## 2020-02-15 NOTE — Assessment & Plan Note (Signed)
Cough with greenish phlegm at least about 2 weeks, afebrile, denied chest pain or SOB, will treat with Z-pk. Prn OTC Mucinex

## 2020-02-15 NOTE — Progress Notes (Signed)
Location:   clinic [place]   Place of Service:  Clinic (12) Provider: Marlana Latus NP  Code Status: DNR Goals of Care:  Advanced Directives 03/17/2019  Does Patient Have a Medical Advance Directive? No;Yes  Type of Advance Directive Living will  Does patient want to make changes to medical advance directive? No - Patient declined  Copy of [place] in Chart? -  Would patient like information on creating a medical advance directive? No - Patient declined     Chief Complaint  Patient presents with  . Medical Management of Chronic Issues    Patient returns to the clinic with and infection in his lungs. Patient is seeing an ophthamologist. He does not see a podiatrist.    HPI: Patient is a 85 y.o. male seen today for an acute visit for cough, greenish phlegm production for a week or two, took Claritin, Prednisone taper dose at home with some improvement. He denied SOB, chest pain, or palpitation.    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   Past Medical History:  Diagnosis Date  . Apnea, sleep   . Barrett's esophagus   . Gout   . Hypertension   . Obesity     Past Surgical History:  Procedure Laterality Date  . CHOLECYSTECTOMY  08/29/2017  . EYE SURGERY Bilateral 2011-2012   catracts removed  . HERNIA REPAIR  2005   T. ThorCarlton Adam . TONSILECTOMY/ADENOIDECTOMY WITH MYRINGOTOMY  [id number]  Allergen Reactions  . Penicillins     Has patient had a PCN reaction causing immediate rash, facial/tongue/throat swelling, SOB or lightheadedness with hypotension: yes, rash Has patient had a PCN reaction causing severe rash involving mucus membranes or skin necrosis: no Has patient had a PCN reaction that required hospitalization: no Has patient had a PCN reaction occurring within the last 10 years: No If all of the above  answers are "NO", then may proceed with Cephalosporin use.     Allergies as of 02/15/2020      Reactions   Penicillins    Has patient had a PCN reaction causing immediate rash, facial/tongue/throat swelling, SOB or lightheadedness with hypotension: yes, rash Has patient had a PCN reaction causing severe rash involving mucus membranes or skin necrosis: no Has patient had a PCN reaction that required hospitalization: no Has patient had a PCN reaction occurring within the last 10 years: No If all of the above answers are "NO", then may proceed with Cephalosporin use.      Medication List       Accurate as of February 15, 2020  4:21 PM. If you have any questions, ask your nurse or doctor.        allopurinol 100 MG tablet Commonly known as: ZYLOPRIM TAKE 1 TABLET BY MOUTH TWICE DAILY.   azithromycin 250 MG tablet Commonly known as: ZITHROMAX Takes 500mg52mmouth for day 1, then 250mg 2mor day 2-5 Started by: Jamaris Biernat X Gage Treiber, NP   Eliquis 5 MG Tabs tablet Generic drug: apixaban Take 2.5 mg by mouth 2 (two) times a day.   fluticasone 50 MCG/ACT nasal spray Commonly known as: FLONASE Place 1 spray into both nostrils daily.   latanoprost 0.005 % ophthalmic solution Commonly known as: XALATAN Place 1 drop into both eyes at bedtime.   multivitamin with minerals tablet  Take 1 tablet by mouth daily.   pantoprazole 40 MG tablet Commonly known as: PROTONIX Take 1 tablet (40 mg total) by mouth daily.   SUMAtriptan 50 MG tablet Commonly known as: IMITREX Take 1 tablet (50 mg total) by mouth as needed.   verapamil 180 MG CR tablet Commonly known as: CALAN-SR TAKE ONE TABLET AT BEDTIME.       Review of Systems:  Review of Systems  Constitutional: Negative for appetite change, fatigue and fever.  HENT: Positive for congestion, hearing loss, postnasal drip and rhinorrhea. Negative for voice change.   Eyes: Negative for visual disturbance.  Respiratory: Positive for cough. Negative  for chest tightness, shortness of breath and wheezing.        Congestion/cough, phlegm sometimes, mostly in am, sometime at night. New greenish thick sputum production.   Cardiovascular: Positive for leg swelling. Negative for chest pain and palpitations.  Gastrointestinal: Negative for abdominal pain, constipation and vomiting.  Genitourinary: Negative for dysuria and urgency.  Musculoskeletal: Positive for arthralgias and gait problem.  Skin: Negative for color change.       Rosacea nose. Left great toe soreness at night is chronic.   Neurological: Negative for speech difficulty, weakness and light-headedness.  Psychiatric/Behavioral: Negative for behavioral problems and sleep disturbance. The patient is not nervous/anxious.     Health Maintenance  Topic Date Due  . INFLUENZA VACCINE  03/24/2020  . TETANUS/TDAP  07/31/2026  . COVID-19 Vaccine  Completed  . PNA vac Low Risk Adult  Completed    Physical Exam: Vitals:   02/15/20 1258  BP: 136/84  Pulse: 84  Temp: 98.4 F (36.9 C)  SpO2: 96%  Weight: 244 lb 12.8 oz (111 kg)  Height: 5' 11"  (1.803 m)   Body mass index is 34.14 kg/m. Physical Exam Vitals and nursing note reviewed.  Constitutional:      Appearance: Normal appearance.  HENT:     Head: Normocephalic and atraumatic.     Nose: Congestion and rhinorrhea present.     Mouth/Throat:     Mouth: Mucous membranes are moist.  Eyes:     Extraocular Movements: Extraocular movements intact.     Conjunctiva/sclera: Conjunctivae normal.     Pupils: Pupils are equal, round, and reactive to light.  Cardiovascular:     Rate and Rhythm: Normal rate and regular rhythm.     Heart sounds: No murmur heard.      Comments: Weak DP pulses Pulmonary:     Breath sounds: Rales present.     Comments: Bibasilar rales.  Abdominal:     General: Bowel sounds are normal. There is no distension.     Palpations: Abdomen is soft.     Tenderness: There is no abdominal tenderness.    Musculoskeletal:     Cervical back: Normal range of motion and neck supple.     Right lower leg: No edema.     Left lower leg: Edema present.  Skin:    General: Skin is warm and dry.     Comments: Dark redness in toes, Left great toe>R great toe, better after elevation. rosacea nose  Neurological:     General: No focal deficit present.     Mental Status: He is alert and oriented to person, place, and time. Mental status is at baseline.     Gait: Gait abnormal.  Psychiatric:        Mood and Affect: Mood normal.        Behavior: Behavior normal.  Thought Content: Thought content normal.        Judgment: Judgment normal.     Labs reviewed: Basic Metabolic Panel: Recent Labs    04/18/19 0730 10/05/19 0700  NA 138 137  K 4.0 4.1  CL 105 104  CO2 24 25  GLUCOSE 114* 102*  BUN 13 16  CREATININE 1.05 1.11  CALCIUM 9.1 9.1  TSH 2.22  --    Liver Function Tests: Recent Labs    04/18/19 0730 10/05/19 0700  AST 17 16  ALT 13 13  BILITOT 0.5 0.7  PROT 7.0 7.2   No results for input(s): LIPASE, AMYLASE in the last 8760 hours. No results for input(s): AMMONIA in the last 8760 hours. CBC: Recent Labs    04/18/19 0730 10/05/19 0700  WBC 8.0 9.1  NEUTROABS 3,736 3,868  HGB 14.9 15.0  HCT 44.7 44.3  MCV 91.6 90.6  PLT 257 262   Lipid Panel: Recent Labs    04/18/19 0730 10/05/19 0700  CHOL 209* 223*  HDL 42 43  LDLCALC 136* 152*  TRIG 173* 153*  CHOLHDL 5.0* 5.2*   Lab Results  Component Value Date   HGBA1C 5.9 (H) 10/05/2019    Procedures since last visit: No results found.  Assessment/Plan VTE (venous thromboembolism) Hx, chronic Eliquis 2.15m bid. Update CBC/diff prior to the next appointment.   Allergic rhinitis Nasal congestion, continue Flonase  Barrett's esophagus Stable, continue Pantoprazole.   Gout of ankle Stable, uric acid 4.6 10/06/19, continue Allopurinol.   Hypertension Blood pressure is controlled, continue Verapamil.    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   Hyperlipidemia Will update lipid panel prior to the next appointment, LDL is not at goal, no taking statin.   Acute bronchitis Cough with greenish phlegm at least about 2 weeks, afebrile, denied chest pain or SOB, will treat with Z-pk. Prn OTC Mucinex     Labs/tests ordered:  CBC/diff, CMP/eGFR, lipid panel prior to the next appointment.  Next appt:  3-4 months with Dr. GLyndel Safe

## 2020-02-29 ENCOUNTER — Other Ambulatory Visit: Payer: Self-pay

## 2020-02-29 ENCOUNTER — Non-Acute Institutional Stay: Payer: Medicare PPO | Admitting: Nurse Practitioner

## 2020-02-29 ENCOUNTER — Ambulatory Visit (HOSPITAL_COMMUNITY)
Admission: RE | Admit: 2020-02-29 | Discharge: 2020-02-29 | Disposition: A | Discharge status: Home / Self Care | Payer: Medicare PPO | Source: Ambulatory Visit | Attending: Nurse Practitioner | Admitting: Nurse Practitioner

## 2020-02-29 ENCOUNTER — Encounter: Payer: Self-pay | Admitting: Nurse Practitioner

## 2020-02-29 VITALS — BP 118/76 | HR 83 | Temp 97.3°F | Ht 71.0 in | Wt 243.6 lb

## 2020-02-29 DIAGNOSIS — M10071 Idiopathic gout, right ankle and foot: Secondary | ICD-10-CM | POA: Diagnosis not present

## 2020-02-29 DIAGNOSIS — K2270 Barrett's esophagus without dysplasia: Secondary | ICD-10-CM | POA: Diagnosis not present

## 2020-02-29 DIAGNOSIS — R058 Other specified cough: Secondary | ICD-10-CM

## 2020-02-29 DIAGNOSIS — R05 Cough: Secondary | ICD-10-CM

## 2020-02-29 DIAGNOSIS — I8290 Acute embolism and thrombosis of unspecified vein: Secondary | ICD-10-CM | POA: Insufficient documentation

## 2020-02-29 DIAGNOSIS — J309 Allergic rhinitis, unspecified: Secondary | ICD-10-CM

## 2020-02-29 DIAGNOSIS — I10 Essential (primary) hypertension: Secondary | ICD-10-CM | POA: Diagnosis not present

## 2020-02-29 MED ORDER — FAMOTIDINE 20 MG PO TABS: 20.0000 mg | ORAL_TABLET | Freq: Every day | ORAL | 2 refills | Status: DC

## 2020-02-29 NOTE — Progress Notes (Signed)
Location:   clinic [place]   Place of Service:  Clinic (12) Provider: Marlana Latus NP  Code Status: DNR Goals of Care: [place] Advanced Directives 03/17/2019  Does Patient Have a Medical Advance Directive? No;Yes  Type of Advance Directive Living will  Does patient want to make changes to medical advance directive? No - Patient declined  Copy of [place] in Chart? -  Would patient like information on creating a medical advance directive? No - Patient declined     Chief Complaint  Patient presents with  . Acute Visit    Recurrent upper respiratory infection.    HPI: Patient is a 85 y.o. male seen today for an acute visit for cough, CBC/diff, CMP/eGFR pending  Acute bronchitis 02/15/20 c/o  Cough with greenish phlegm at least about 2 weeks, afebrile, denied chest pain or SOB, completed Z-pk. Prn OTC Mucinex. 02/29/20 no significant improvement or changes form prior, not feeling sick, sputum more in am and pm, occasional cough with eating, pending CXR    Gout, takes Allopurinol 156m qd             Hx of PE/DVT, takes Eliquis 2.575mbid             Allergic rhinitis, takes Flonase qd             GERD, takes Pantoprazole 4080md             HTN, takes Verapamil 180m56m.    Past Medical History:  Diagnosis Date  . Apnea, sleep   . Barrett's esophagus   . Gout   . Hypertension   . Obesity     Past Surgical History:  Procedure Laterality Date  . CHOLECYSTECTOMY  08/29/2017  . EYE SURGERY Bilateral 2011-2012   catracts removed  . HERNIA REPAIR  2005   T. ThorCarlton Adam . TONSILECTOMY/ADENOIDECTOMY WITH MYRINGOTOMY  [id number]  Allergen Reactions  . Penicillins     Has patient had a PCN reaction causing immediate rash, facial/tongue/throat swelling, SOB or lightheadedness with hypotension: yes, rash Has patient had a PCN reaction causing severe rash involving mucus membranes or skin necrosis: no Has patient had a PCN reaction that required  hospitalization: no Has patient had a PCN reaction occurring within the last 10 years: No If all of the above answers are "NO", then may proceed with Cephalosporin use.     Allergies as of 02/29/2020      Reactions   Penicillins    Has patient had a PCN reaction causing immediate rash, facial/tongue/throat swelling, SOB or lightheadedness with hypotension: yes, rash Has patient had a PCN reaction causing severe rash involving mucus membranes or skin necrosis: no Has patient had a PCN reaction that required hospitalization: no Has patient had a PCN reaction occurring within the last 10 years: No If all of the above answers are "NO", then may proceed with Cephalosporin use.      Medication List       Accurate as of February 29, 2020  4:36 PM. If you have any questions, ask your nurse or doctor.        allopurinol 100 MG tablet Commonly known as: ZYLOPRIM TAKE 1 TABLET BY MOUTH TWICE DAILY.   azithromycin 250 MG tablet Commonly known as: ZITHROMAX Takes 500mg78mmouth for day 1, then 250mg 46mor day 2-5   Eliquis 5 MG Tabs tablet Generic drug: apixaban Take 2.5 mg by mouth 2 (two) times  a day.   famotidine 20 MG tablet Commonly known as: PEPCID Take 1 tablet (20 mg total) by mouth daily. Started by: Tiphany Fayson X Escarlet Saathoff, NP   fluticasone 50 MCG/ACT nasal spray Commonly known as: FLONASE Place 1 spray into both nostrils daily.   latanoprost 0.005 % ophthalmic solution Commonly known as: XALATAN Place 1 drop into both eyes at bedtime.   multivitamin with minerals tablet Take 1 tablet by mouth daily.   pantoprazole 40 MG tablet Commonly known as: PROTONIX Take 1 tablet (40 mg total) by mouth daily.   SUMAtriptan 50 MG tablet Commonly known as: IMITREX Take 1 tablet (50 mg total) by mouth as needed.   verapamil 180 MG CR tablet Commonly known as: CALAN-SR TAKE ONE TABLET AT BEDTIME.       Review of Systems:  Review of Systems  Constitutional: Negative for activity change,  appetite change and fever.  HENT: Positive for congestion, hearing loss, postnasal drip and rhinorrhea. Negative for voice change.   Eyes: Negative for visual disturbance.  Respiratory: Positive for cough. Negative for chest tightness, shortness of breath and wheezing.        Congestion/cough, phlegm sometimes, mostly in am, sometime at night. Still greenish phlegm.   Cardiovascular: Positive for leg swelling. Negative for chest pain and palpitations.  Gastrointestinal: Negative for abdominal pain and constipation.  Genitourinary: Negative for dysuria and urgency.  Musculoskeletal: Positive for arthralgias and gait problem.  Skin: Negative for color change.       Rosacea nose. Left great toe soreness at night is chronic.   Neurological: Negative for facial asymmetry, speech difficulty, weakness and light-headedness.  Psychiatric/Behavioral: Negative for behavioral problems and sleep disturbance. The patient is not nervous/anxious.     Health Maintenance  Topic Date Due  . INFLUENZA VACCINE  03/24/2020  . TETANUS/TDAP  07/31/2026  . COVID-19 Vaccine  Completed  . PNA vac Low Risk Adult  Completed    Physical Exam: Vitals:   02/29/20 1403  BP: 118/76  Pulse: 83  Temp: (!) 97.3 F (36.3 C)  SpO2: 95%  Weight: 243 lb 9.6 oz (110.5 kg)  Height: 5' 11"  (1.803 m)   Body mass index is 33.98 kg/m. Physical Exam Vitals and nursing note reviewed.  Constitutional:      Appearance: Normal appearance.  HENT:     Head: Normocephalic and atraumatic.     Nose: Congestion and rhinorrhea present.     Mouth/Throat:     Mouth: Mucous membranes are moist.  Eyes:     Extraocular Movements: Extraocular movements intact.     Conjunctiva/sclera: Conjunctivae normal.     Pupils: Pupils are equal, round, and reactive to light.  Cardiovascular:     Rate and Rhythm: Normal rate and regular rhythm.     Heart sounds: No murmur heard.      Comments: Weak DP pulses Pulmonary:     Breath sounds:  Rales present.     Comments: Bibasilar rales.  Abdominal:     General: Bowel sounds are normal.     Palpations: Abdomen is soft.     Tenderness: There is no abdominal tenderness.  Musculoskeletal:     Cervical back: Normal range of motion and neck supple.     Right lower leg: No edema.     Left lower leg: Edema present.  Skin:    General: Skin is warm and dry.     Comments: Dark redness in toes, Left great toe>R great toe, better after elevation. rosacea nose  Neurological:     General: No focal deficit present.     Mental Status: He is alert and oriented to person, place, and time. Mental status is at baseline.     Gait: Gait abnormal.  Psychiatric:        Mood and Affect: Mood normal.        Behavior: Behavior normal.        Thought Content: Thought content normal.        Judgment: Judgment normal.     Labs reviewed: Basic Metabolic Panel: Recent Labs    04/18/19 0730 10/05/19 0700  NA 138 137  K 4.0 4.1  CL 105 104  CO2 24 25  GLUCOSE 114* 102*  BUN 13 16  CREATININE 1.05 1.11  CALCIUM 9.1 9.1  TSH 2.22  --    Liver Function Tests: Recent Labs    04/18/19 0730 10/05/19 0700  AST 17 16  ALT 13 13  BILITOT 0.5 0.7  PROT 7.0 7.2   No results for input(s): LIPASE, AMYLASE in the last 8760 hours. No results for input(s): AMMONIA in the last 8760 hours. CBC: Recent Labs    04/18/19 0730 10/05/19 0700  WBC 8.0 9.1  NEUTROABS 3,736 3,868  HGB 14.9 15.0  HCT 44.7 44.3  MCV 91.6 90.6  PLT 257 262   Lipid Panel: Recent Labs    04/18/19 0730 10/05/19 0700  CHOL 209* 223*  HDL 42 43  LDLCALC 136* 152*  TRIG 173* 153*  CHOLHDL 5.0* 5.2*   Lab Results  Component Value Date   HGBA1C 5.9 (H) 10/05/2019    Procedures since last visit: No results found.  Assessment/Plan Cough variant not due to asthma Acute bronchitis 02/15/20 c/o  Cough with greenish phlegm at least about 2 weeks, afebrile, denied chest pain or SOB, completed Z-pk. Prn OTC  Mucinex. 02/29/20 no significant improvement or changes form prior, not feeling sick, sputum more in am and pm, occasional cough with eating, pending CXR 02/29/20 obtain CXR ASAP, referral to ST to r/o Dysphagia, better managed GERD-adding Famotidine 11m qd.   Allergic rhinitis Still some nasal congestion, no facial pressure or pain, continue Flonase.   Barrett's esophagus Saw GI 10/21/17, PPI Continue Pantoprazole, adding Famotidine in setting of phlegm/cough in am, sometime in pm, not responding to Z-pk or Mucinex, no chest pain, SOB, fever, or O2 desaturation, pending CXR,   Gout of ankle Stable, uric acid 4.6 10/06/19, continue Allopurinol.   VTE (venous thromboembolism)  Continue Eliquis 2.520mbid.   Hypertension Blood pressure is controlled, continue Verapamil.     Labs/tests ordered:  Pending CXR  Next appt:  05/21/2020

## 2020-02-29 NOTE — Assessment & Plan Note (Signed)
Saw GI 10/21/17, PPI Continue Pantoprazole, adding Famotidine in setting of phlegm/cough in am, sometime in pm, not responding to Z-pk or Mucinex, no chest pain, SOB, fever, or O2 desaturation, pending CXR,

## 2020-02-29 NOTE — Assessment & Plan Note (Signed)
Still some nasal congestion, no facial pressure or pain, continue Flonase.

## 2020-02-29 NOTE — Assessment & Plan Note (Signed)
Acute bronchitis 02/15/20 c/o  Cough with greenish phlegm at least about 2 weeks, afebrile, denied chest pain or SOB, completed Z-pk. Prn OTC Mucinex. 02/29/20 no significant improvement or changes form prior, not feeling sick, sputum more in am and pm, occasional cough with eating, pending CXR 02/29/20 obtain CXR ASAP, referral to ST to r/o Dysphagia, better managed GERD-adding Famotidine 20mg  qd.

## 2020-02-29 NOTE — Assessment & Plan Note (Signed)
Blood pressure is controlled, continue Verapamil.  

## 2020-02-29 NOTE — Assessment & Plan Note (Signed)
Stable, uric acid 4.6 10/06/19, continue Allopurinol.

## 2020-02-29 NOTE — Patient Instructions (Signed)
Adding Famotidine for better GERD management, referral to Speech Therapy to evaluate possible swallowing related cough, phlegm production. Obtain CXR ASAP

## 2020-02-29 NOTE — Assessment & Plan Note (Signed)
Continue Eliquis 2.5 mg b.i.d..

## 2020-03-18 DIAGNOSIS — I82512 Chronic embolism and thrombosis of left femoral vein: Secondary | ICD-10-CM | POA: Diagnosis not present

## 2020-03-18 DIAGNOSIS — Z7901 Long term (current) use of anticoagulants: Secondary | ICD-10-CM | POA: Diagnosis not present

## 2020-03-18 DIAGNOSIS — Z79899 Other long term (current) drug therapy: Secondary | ICD-10-CM | POA: Diagnosis not present

## 2020-03-18 DIAGNOSIS — Z87891 Personal history of nicotine dependence: Secondary | ICD-10-CM | POA: Diagnosis not present

## 2020-03-18 DIAGNOSIS — I2782 Chronic pulmonary embolism: Secondary | ICD-10-CM | POA: Diagnosis not present

## 2020-03-25 ENCOUNTER — Other Ambulatory Visit: Payer: Self-pay | Admitting: Nurse Practitioner

## 2020-04-01 ENCOUNTER — Other Ambulatory Visit: Payer: Self-pay | Admitting: Nurse Practitioner

## 2020-04-10 DIAGNOSIS — R1312 Dysphagia, oropharyngeal phase: Secondary | ICD-10-CM | POA: Diagnosis not present

## 2020-05-17 ENCOUNTER — Other Ambulatory Visit: Payer: Self-pay | Admitting: Nurse Practitioner

## 2020-05-19 ENCOUNTER — Other Ambulatory Visit: Payer: Self-pay | Admitting: Nurse Practitioner

## 2020-05-21 ENCOUNTER — Other Ambulatory Visit: Payer: Self-pay

## 2020-05-21 DIAGNOSIS — K2270 Barrett's esophagus without dysplasia: Secondary | ICD-10-CM

## 2020-05-21 DIAGNOSIS — E782 Mixed hyperlipidemia: Secondary | ICD-10-CM

## 2020-05-21 DIAGNOSIS — N182 Chronic kidney disease, stage 2 (mild): Secondary | ICD-10-CM

## 2020-05-24 ENCOUNTER — Encounter: Payer: Self-pay | Admitting: Internal Medicine

## 2020-05-24 ENCOUNTER — Non-Acute Institutional Stay: Payer: Medicare PPO | Admitting: Internal Medicine

## 2020-05-24 ENCOUNTER — Other Ambulatory Visit: Payer: Self-pay

## 2020-05-24 VITALS — BP 136/82 | HR 91 | Temp 97.8°F | Ht 71.0 in | Wt 252.4 lb

## 2020-05-24 DIAGNOSIS — G43109 Migraine with aura, not intractable, without status migrainosus: Secondary | ICD-10-CM | POA: Diagnosis not present

## 2020-05-24 DIAGNOSIS — M10071 Idiopathic gout, right ankle and foot: Secondary | ICD-10-CM | POA: Diagnosis not present

## 2020-05-24 DIAGNOSIS — K2270 Barrett's esophagus without dysplasia: Secondary | ICD-10-CM

## 2020-05-24 DIAGNOSIS — I2699 Other pulmonary embolism without acute cor pulmonale: Secondary | ICD-10-CM | POA: Diagnosis not present

## 2020-05-24 DIAGNOSIS — G4730 Sleep apnea, unspecified: Secondary | ICD-10-CM | POA: Diagnosis not present

## 2020-05-27 ENCOUNTER — Other Ambulatory Visit: Payer: Self-pay | Admitting: Nurse Practitioner

## 2020-05-27 DIAGNOSIS — K2270 Barrett's esophagus without dysplasia: Secondary | ICD-10-CM

## 2020-05-27 MED ORDER — CLOTRIMAZOLE-BETAMETHASONE 1-0.05 % EX CREA: 1.0000 "application " | TOPICAL_CREAM | Freq: Two times a day (BID) | CUTANEOUS | 0 refills | Status: DC

## 2020-05-27 NOTE — Progress Notes (Signed)
Location:  [place] of Service:  Clinic (12)  Provider:   Code Status:  Goals of Care:  Advanced Directives 03/17/2019  Does Patient Have a Medical Advance Directive? No;Yes  Type of Advance Directive Living will  Does patient want to make changes to medical advance directive? No - Patient declined  Copy of [place] in Chart? -  Would patient like information on creating a medical advance directive? No - Patient declined     Chief Complaint  Patient presents with  . Medical Management of Chronic Issues    Patient returns to the clinic for follow up.    HPI: Patient is a 85 y.o. male seen today for medical management of chronic diseases.    Patient has a history of hypertension, migraine headaches, GERD and history of gout. History of sleep apnea and PE with Right Lower Lobe Emboli Diagnosed in 6/20 It was unprovoked so needs to stay on Eliquis for now. EF of 55 to 60% with no wall motion    Doing well. Continues to be very Sedentary.Does not wear his BIPAP. Sleeps during day time. Gets SOB and Fatigued easily Has Gained weight More Concern about his wife who he thinks has Early Memory issues Was having some issues with Dysphagia . Now Better with Pepcid and Protonix   Past Medical History:  Diagnosis Date  . Apnea, sleep   . Barrett's esophagus   . Gout   . Hypertension   . Obesity     Past Surgical History:  Procedure Laterality Date  . CHOLECYSTECTOMY  08/29/2017  . EYE SURGERY Bilateral 2011-2012   catracts removed  . HERNIA REPAIR  2005   T. Carlton Adam MD  . TONSILECTOMY/ADENOIDECTOMY WITH MYRINGOTOMY  1062    Allergies  Allergen Reactions  . Penicillins     Has patient had a PCN reaction causing immediate rash, facial/tongue/throat swelling, SOB or lightheadedness with hypotension: yes, rash Has patient had a PCN reaction causing severe rash involving mucus membranes or skin necrosis: no Has patient had a PCN  reaction that required hospitalization: no Has patient had a PCN reaction occurring within the last 10 years: No If all of the above answers are "NO", then may proceed with Cephalosporin use.     Outpatient Encounter Medications as of 05/24/2020  Medication Sig  . allopurinol (ZYLOPRIM) 100 MG tablet TAKE 1 TABLET BY MOUTH TWICE DAILY.  Marland Kitchen ELIQUIS 5 MG TABS tablet Take 2.5 mg by mouth 2 (two) times a day.   . fluticasone (FLONASE) 50 MCG/ACT nasal spray Place 1 spray into both nostrils daily.   Marland Kitchen latanoprost (XALATAN) 0.005 % ophthalmic solution Place 1 drop into both eyes at bedtime.  . Multiple Vitamins-Minerals (MULTIVITAMIN WITH MINERALS) tablet Take 1 tablet by mouth daily.  . pantoprazole (PROTONIX) 40 MG tablet TAKE 1 TABLET ONCE DAILY.  . SUMAtriptan (IMITREX) 50 MG tablet Take 1 tablet (50 mg total) by mouth as needed.  . verapamil (CALAN-SR) 180 MG CR tablet TAKE ONE TABLET AT BEDTIME.  . [DISCONTINUED] famotidine (PEPCID) 20 MG tablet Take 1 tablet (20 mg total) by mouth daily.  . clotrimazole-betamethasone (LOTRISONE) cream Apply 1 application topically 2 (two) times daily.  . [DISCONTINUED] azithromycin (ZITHROMAX) 250 MG tablet Takes 500mg  by mouth for day 1, then 250mg  qd for day 2-5   No facility-administered encounter medications on file as of 05/24/2020.    Review of Systems:  Review of Systems  Constitutional: Positive for activity  change.  HENT: Negative.   Respiratory: Positive for shortness of breath.   Cardiovascular: Positive for leg swelling.  Gastrointestinal: Negative.   Genitourinary: Negative.   Musculoskeletal: Negative.   Skin: Negative.   Neurological: Negative.   Psychiatric/Behavioral: Negative.     Health Maintenance  Topic Date Due  . INFLUENZA VACCINE  03/24/2020  . TETANUS/TDAP  07/31/2026  . COVID-19 Vaccine  Completed  . PNA vac Low Risk Adult  Completed    Physical Exam: Vitals:   05/24/20 1058  BP: 136/82  Pulse: 91  Temp: 97.8  F (36.6 C)  SpO2: 94%  Weight: 252 lb 6.4 oz (114.5 kg)  Height: 5\' 11"  (1.803 m)   Body mass index is 35.2 kg/m. Physical Exam Vitals reviewed.  Constitutional:      Appearance: He is obese.  HENT:     Head: Normocephalic.     Nose: Nose normal.     Mouth/Throat:     Mouth: Mucous membranes are moist.     Pharynx: Oropharynx is clear.  Cardiovascular:     Rate and Rhythm: Normal rate.     Pulses: Normal pulses.  Pulmonary:     Effort: Pulmonary effort is normal.     Breath sounds: Normal breath sounds.  Abdominal:     General: Abdomen is flat. Bowel sounds are normal.     Palpations: Abdomen is soft.  Musculoskeletal:     Cervical back: Neck supple.     Comments: Mild Swelling Bilateral  Skin:    General: Skin is warm.     Comments: Small Red rash in his Lower Right leg   Neurological:     General: No focal deficit present.     Mental Status: He is alert and oriented to person, place, and time.  Psychiatric:        Mood and Affect: Mood normal.     Labs reviewed: Basic Metabolic Panel: Recent Labs    10/05/19 0700  NA 137  K 4.1  CL 104  CO2 25  GLUCOSE 102*  BUN 16  CREATININE 1.11  CALCIUM 9.1   Liver Function Tests: Recent Labs    10/05/19 0700  AST 16  ALT 13  BILITOT 0.7  PROT 7.2   No results for input(s): LIPASE, AMYLASE in the last 8760 hours. No results for input(s): AMMONIA in the last 8760 hours. CBC: Recent Labs    10/05/19 0700  WBC 9.1  NEUTROABS 3,868  HGB 15.0  HCT 44.3  MCV 90.6  PLT 262   Lipid Panel: Recent Labs    10/05/19 0700  CHOL 223*  HDL 43  LDLCALC 152*  TRIG 153*  CHOLHDL 5.2*   Lab Results  Component Value Date   HGBA1C 5.9 (H) 10/05/2019    Procedures since last visit: No results found.  Assessment/Plan  Barrett's esophagus without dysplasia Doing well on Pepcid and Protonix Does not want to follow with GI yet Idiopathic gout of right ankle,  On Allopurinal Sleep apnea in adult Is  not using BIPAP Other acute pulmonary embolism without acute cor pulmonale (HCC) Has to continue Eliquis Migraine with aura and without status migrainosus, not intractable Uses Imitrex PRN  Essential hypertension - Plan: Continue on verapamil Echo in the hospital was normal Hyperlipidemia Refuses Statins He would benefit with statin.  His LDL is 136 He said he has no family history of heart disease. At this time he wants to exercise and try to control his diet  Gets Lab at  hematology Office Labs/tests ordered:  * No order type specified * Next appt:  05/27/2020

## 2020-06-24 ENCOUNTER — Other Ambulatory Visit: Payer: Self-pay | Admitting: Internal Medicine

## 2020-06-24 DIAGNOSIS — K2270 Barrett's esophagus without dysplasia: Secondary | ICD-10-CM

## 2020-07-21 ENCOUNTER — Other Ambulatory Visit: Payer: Self-pay | Admitting: Internal Medicine

## 2020-07-21 ENCOUNTER — Other Ambulatory Visit: Payer: Self-pay | Admitting: Nurse Practitioner

## 2020-07-21 DIAGNOSIS — K2270 Barrett's esophagus without dysplasia: Secondary | ICD-10-CM

## 2020-07-31 ENCOUNTER — Other Ambulatory Visit: Payer: Self-pay | Admitting: Nurse Practitioner

## 2020-08-09 DIAGNOSIS — H401131 Primary open-angle glaucoma, bilateral, mild stage: Secondary | ICD-10-CM | POA: Diagnosis not present

## 2020-08-13 ENCOUNTER — Other Ambulatory Visit: Payer: Self-pay | Admitting: Internal Medicine

## 2020-08-21 ENCOUNTER — Other Ambulatory Visit: Payer: Self-pay | Admitting: Nurse Practitioner

## 2020-08-21 DIAGNOSIS — K2270 Barrett's esophagus without dysplasia: Secondary | ICD-10-CM

## 2020-09-18 DIAGNOSIS — Z6833 Body mass index (BMI) 33.0-33.9, adult: Secondary | ICD-10-CM | POA: Diagnosis not present

## 2020-09-18 DIAGNOSIS — Z79899 Other long term (current) drug therapy: Secondary | ICD-10-CM | POA: Diagnosis not present

## 2020-09-18 DIAGNOSIS — Z87891 Personal history of nicotine dependence: Secondary | ICD-10-CM | POA: Diagnosis not present

## 2020-09-18 DIAGNOSIS — I82512 Chronic embolism and thrombosis of left femoral vein: Secondary | ICD-10-CM | POA: Diagnosis not present

## 2020-09-18 DIAGNOSIS — E669 Obesity, unspecified: Secondary | ICD-10-CM | POA: Diagnosis not present

## 2020-09-18 DIAGNOSIS — M109 Gout, unspecified: Secondary | ICD-10-CM | POA: Diagnosis not present

## 2020-09-18 DIAGNOSIS — Z7901 Long term (current) use of anticoagulants: Secondary | ICD-10-CM | POA: Diagnosis not present

## 2020-09-18 DIAGNOSIS — I2782 Chronic pulmonary embolism: Secondary | ICD-10-CM | POA: Diagnosis not present

## 2020-09-21 ENCOUNTER — Other Ambulatory Visit: Payer: Self-pay | Admitting: Nurse Practitioner

## 2020-09-21 DIAGNOSIS — K2270 Barrett's esophagus without dysplasia: Secondary | ICD-10-CM

## 2020-09-26 ENCOUNTER — Encounter: Payer: Self-pay | Admitting: Nurse Practitioner

## 2020-09-26 ENCOUNTER — Other Ambulatory Visit: Payer: Self-pay

## 2020-09-26 ENCOUNTER — Encounter: Payer: Medicare PPO | Admitting: Nurse Practitioner

## 2020-09-26 ENCOUNTER — Non-Acute Institutional Stay: Payer: Medicare PPO | Admitting: Nurse Practitioner

## 2020-09-26 DIAGNOSIS — I8290 Acute embolism and thrombosis of unspecified vein: Secondary | ICD-10-CM

## 2020-09-26 DIAGNOSIS — E782 Mixed hyperlipidemia: Secondary | ICD-10-CM

## 2020-09-26 DIAGNOSIS — M10071 Idiopathic gout, right ankle and foot: Secondary | ICD-10-CM | POA: Diagnosis not present

## 2020-09-26 DIAGNOSIS — R7303 Prediabetes: Secondary | ICD-10-CM | POA: Diagnosis not present

## 2020-09-26 DIAGNOSIS — G4730 Sleep apnea, unspecified: Secondary | ICD-10-CM | POA: Diagnosis not present

## 2020-09-26 DIAGNOSIS — K2270 Barrett's esophagus without dysplasia: Secondary | ICD-10-CM | POA: Diagnosis not present

## 2020-09-26 DIAGNOSIS — J309 Allergic rhinitis, unspecified: Secondary | ICD-10-CM | POA: Diagnosis not present

## 2020-09-26 DIAGNOSIS — G43109 Migraine with aura, not intractable, without status migrainosus: Secondary | ICD-10-CM | POA: Diagnosis not present

## 2020-09-26 DIAGNOSIS — I10 Essential (primary) hypertension: Secondary | ICD-10-CM

## 2020-09-26 NOTE — Assessment & Plan Note (Signed)
Hx of PE/DVT, takes Eliquis 2.5mg  bid

## 2020-09-26 NOTE — Assessment & Plan Note (Addendum)
HTN, elevated today, the patient denied headache, chest pain/pressure, palpitation, SOB, will continue to observe, takes Verapamil 186m qd. Update CMP/eGFR

## 2020-09-26 NOTE — Assessment & Plan Note (Signed)
Allergic rhinitis, takes Flonase qd

## 2020-09-26 NOTE — Assessment & Plan Note (Signed)
Prediabetes, Hgb a1c 5.9 10/16/19. Update Hgb a1c.

## 2020-09-26 NOTE — Assessment & Plan Note (Signed)
Hyperlipidemia, not taking statin. Update lipid panel.

## 2020-09-26 NOTE — Assessment & Plan Note (Signed)
GERD, Barrett"s,  takes Pantoprazole 40mg  qd, Pepcid, declined GI f/u. Update CBC/diff.

## 2020-09-26 NOTE — Assessment & Plan Note (Signed)
OSA, not using BiPAP

## 2020-09-26 NOTE — Assessment & Plan Note (Signed)
Migraine with Aura, uses prn Imitrex.

## 2020-09-26 NOTE — Progress Notes (Signed)
Location:   clinic [facility]   Place of Service:  Clinic (12) Provider: Marlana Latus NP  Code Status: DNR Goals of Care: [place] 03/17/2019  Does Patient Have a Medical Advance Directive? No;Yes  Type of Advance Directive Living will  Does patient want to make changes to medical advance directive? No - Patient declined  Copy of [place] in Chart? -  Would patient like information on creating a medical advance directive? No - Patient declined     Chief Complaint  Patient presents with  . Medical Management of Chronic Issues    Patient returns to the clinic for 4 month follow up. Patient would like to discuss his and his wife's memory issues.     HPI: Patient is a 86 y.o. male seen today for medical management of chronic diseases.     Gout, takes Allopurinol 162m qd, R ankle. Uric acid 4.6 10/06/19 Hx of PE/DVT(L leg), takes Eliquis 2.524mbid,  Allergic rhinitis, takes Flonase qd GERD, Barrett"s,  takes Pantoprazole 4051md, Pepcid, declined GI f/u.   HTN, takes Verapamil 180m38m. Bun/creat 16/1.11 10/05/19  OSA, not using BiPAP  Migraine with Aura, uses prn Imitrex.   Hyperlipidemia, not taking statin.   Prediabetes, Hgb a1c 5.9 10/16/19 Past Medical History:  Diagnosis Date  . Apnea, sleep   . Barrett's esophagus   . Gout   . Hypertension   . Obesity     Past Surgical History:  Procedure Laterality Date  . CHOLECYSTECTOMY  08/29/2017  . EYE SURGERY Bilateral 2011-2012   catracts removed  . HERNIA REPAIR  2005   T. ThorCarlton Adam . TONSILECTOMY/ADENOIDECTOMY WITH MYRINGOTOMY  [id number]  Allergen Reactions  . Penicillins     Has patient had a PCN reaction causing immediate rash, facial/tongue/throat swelling, SOB or lightheadedness with hypotension: yes, rash Has patient had a PCN reaction causing severe rash involving mucus membranes or skin necrosis: no Has patient had a PCN reaction that  required hospitalization: no Has patient had a PCN reaction occurring within the last 10 years: No If all of the above answers are "NO", then may proceed with Cephalosporin use.     Allergies as of 09/26/2020      Reactions   Penicillins    Has patient had a PCN reaction causing immediate rash, facial/tongue/throat swelling, SOB or lightheadedness with hypotension: yes, rash Has patient had a PCN reaction causing severe rash involving mucus membranes or skin necrosis: no Has patient had a PCN reaction that required hospitalization: no Has patient had a PCN reaction occurring within the last 10 years: No If all of the above answers are "NO", then may proceed with Cephalosporin use.      Medication List       Accurate as of September 26, 2020 11:59 PM. If you have any questions, ask your nurse or doctor.        allopurinol 100 MG tablet Commonly known as: ZYLOPRIM TAKE 1 TABLET BY MOUTH TWICE DAILY.   clotrimazole-betamethasone cream Commonly known as: LOTRISONE Apply 1 application topically 2 (two) times daily.   Eliquis 5 MG Tabs tablet Generic drug: apixaban Take 2.5 mg by mouth 2 (two) times a day.   famotidine 20 MG tablet Commonly known as: PEPCID TAKE 1 TABLET ONCE DAILY.   fluticasone 50 MCG/ACT nasal spray Commonly known as: FLONASE Place 1 spray into both nostrils daily.   latanoprost 0.005 % ophthalmic solution Commonly known as:  XALATAN Place 1 drop into both eyes at bedtime.   multivitamin with minerals tablet Take 1 tablet by mouth daily.   pantoprazole 40 MG tablet Commonly known as: PROTONIX TAKE 1 TABLET ONCE DAILY.   SUMAtriptan 50 MG tablet Commonly known as: IMITREX Take 1 tablet (50 mg total) by mouth as needed.   verapamil 180 MG CR tablet Commonly known as: CALAN-SR TAKE ONE TABLET AT BEDTIME.       Review of Systems:  Review of Systems  Constitutional: Negative for activity change, appetite change and fever.  HENT: Positive for  hearing loss. Negative for congestion, rhinorrhea and voice change.   Eyes: Negative for visual disturbance.  Respiratory: Positive for cough. Negative for chest tightness, shortness of breath and wheezing.        Congestion/cough, phlegm sometimes, mostly in am, sometime at night. Still greenish phlegm.   Cardiovascular: Positive for leg swelling. Negative for chest pain and palpitations.  Gastrointestinal: Negative for abdominal pain and constipation.  Genitourinary: Negative for dysuria and urgency.  Musculoskeletal: Positive for arthralgias and gait problem.  Skin: Negative for color change.       Rosacea nose.   Neurological: Negative for facial asymmetry, speech difficulty, weakness and light-headedness.  Psychiatric/Behavioral: Negative for behavioral problems and sleep disturbance. The patient is not nervous/anxious.     Health Maintenance  Topic Date Due  . TETANUS/TDAP  07/31/2026  . INFLUENZA VACCINE  Completed  . COVID-19 Vaccine  Completed  . PNA vac Low Risk Adult  Completed    Physical Exam: Vitals:   09/26/20 1521  BP: (!) 136/98  Pulse: 90  Temp: (!) 97.1 F (36.2 C)  SpO2: 95%  Weight: 243 lb 9.6 oz (110.5 kg)  Height: 5' 11" (1.803 m)   Body mass index is 33.98 kg/m. Physical Exam Vitals and nursing note reviewed.  Constitutional:      Appearance: Normal appearance.  HENT:     Head: Normocephalic and atraumatic.     Nose: Congestion and rhinorrhea present.     Mouth/Throat:     Mouth: Mucous membranes are moist.  Eyes:     Extraocular Movements: Extraocular movements intact.     Conjunctiva/sclera: Conjunctivae normal.     Pupils: Pupils are equal, round, and reactive to light.  Cardiovascular:     Rate and Rhythm: Normal rate and regular rhythm.     Heart sounds: No murmur heard.     Comments: Weak DP pulses Pulmonary:     Breath sounds: Rales present.     Comments: Bibasilar rales.  Abdominal:     General: Bowel sounds are normal.      Palpations: Abdomen is soft.     Tenderness: There is no abdominal tenderness.  Musculoskeletal:     Cervical back: Normal range of motion and neck supple.     Right lower leg: Edema present.     Left lower leg: Edema present.     Comments: Trace edema BLE  Skin:    General: Skin is warm and dry.     Comments: Dark redness in toes, Left great toe>R great toe, better after elevation. rosacea nose  Neurological:     General: No focal deficit present.     Mental Status: He is alert and oriented to person, place, and time. Mental status is at baseline.     Gait: Gait abnormal.  Psychiatric:        Mood and Affect: Mood normal.        Behavior:  Behavior normal.        Thought Content: Thought content normal.        Judgment: Judgment normal.     Labs reviewed: Basic Metabolic Panel: Recent Labs    10/05/19 0700  NA 137  K 4.1  CL 104  CO2 25  GLUCOSE 102*  BUN 16  CREATININE 1.11  CALCIUM 9.1   Liver Function Tests: Recent Labs    10/05/19 0700  AST 16  ALT 13  BILITOT 0.7  PROT 7.2   No results for input(s): LIPASE, AMYLASE in the last 8760 hours. No results for input(s): AMMONIA in the last 8760 hours. CBC: Recent Labs    10/05/19 0700  WBC 9.1  NEUTROABS 3,868  HGB 15.0  HCT 44.3  MCV 90.6  PLT 262   Lipid Panel: Recent Labs    10/05/19 0700  CHOL 223*  HDL 43  LDLCALC 152*  TRIG 153*  CHOLHDL 5.2*   Lab Results  Component Value Date   HGBA1C 5.9 (H) 10/05/2019    Procedures since last visit: No results found.  Assessment/Plan  Hypertension HTN, elevated today, the patient denied headache, chest pain/pressure, palpitation, SOB, will continue to observe, takes Verapamil 172m qd. Update CMP/eGFR  Sleep apnea in adult OSA, not using BiPAP   Migraine headache with aura Migraine with Aura, uses prn Imitrex.   Hyperlipidemia Hyperlipidemia, not taking statin. Update lipid panel.   Prediabetes Prediabetes, Hgb a1c 5.9 10/16/19. Update  Hgb a1c.    Barrett's esophagus GERD, Barrett"s,  takes Pantoprazole 467mqd, Pepcid, declined GI f/u. Update CBC/diff.   Allergic rhinitis Allergic rhinitis, takes Flonase qd   VTE (venous thromboembolism) Hx of PE/DVT, takes Eliquis 2.35m41mid  Gout of ankle Gout, takes Allopurinol 100m39m, R ankle. Update urica acid.     Labs/tests ordered: CBC/diff, CMP/eGFR, Lipid panel, Hgb a1c, Uric acid done outside facility, will request results.   Next appt:  4 months

## 2020-09-26 NOTE — Assessment & Plan Note (Signed)
Gout, takes Allopurinol 100mg  qd, R ankle. Update urica acid.

## 2020-09-27 ENCOUNTER — Encounter: Payer: Self-pay | Admitting: Nurse Practitioner

## 2020-10-01 ENCOUNTER — Ambulatory Visit (INDEPENDENT_AMBULATORY_CARE_PROVIDER_SITE_OTHER): Payer: Medicare PPO | Admitting: Nurse Practitioner

## 2020-10-01 ENCOUNTER — Other Ambulatory Visit: Payer: Self-pay

## 2020-10-01 ENCOUNTER — Telehealth: Payer: Self-pay

## 2020-10-01 ENCOUNTER — Encounter: Payer: Self-pay | Admitting: Nurse Practitioner

## 2020-10-01 DIAGNOSIS — Z0000 Encounter for general adult medical examination without abnormal findings: Secondary | ICD-10-CM | POA: Diagnosis not present

## 2020-10-01 NOTE — Progress Notes (Signed)
Subjective:   Eric Chung is a 86 y.o. male who presents for Medicare Annual/Subsequent preventive examination.  Review of Systems     Cardiac Risk Factors include: sedentary lifestyle;obesity (BMI >30kg/m2);advanced age (>49men, >67 women);hypertension     Objective:    There were no vitals filed for this visit. There is no height or weight on file to calculate BMI.  Advanced Directives 03/17/2019 02/02/2019 07/11/2018 08/26/2017 07/12/2017 05/27/2017 04/15/2017  Does Patient Have a Medical Advance Directive? No;Yes Yes Yes Yes Yes Yes Yes  Type of Advance Directive Living will Living will [place];Living will - [place];Living will [place];Living will [place];Living will  Does patient want to make changes to medical advance directive? No - Patient declined No - Patient declined No - Patient declined No - Patient declined No - Patient declined No - Patient declined No - Patient declined  Copy of [place] in Chart? - - Yes - validated most recent copy scanned in chart (See row information) - - Yes Yes  Would patient like information on creating a medical advance directive? No - Patient declined - - - - - -    Current Medications (verified) Outpatient Encounter Medications as of 10/01/2020  Medication Sig  . allopurinol (ZYLOPRIM) 100 MG tablet TAKE 1 TABLET BY MOUTH TWICE DAILY.  Marland Kitchen ELIQUIS 5 MG TABS tablet Take 2.5 mg by mouth 2 (two) times a day.   . famotidine (PEPCID) 20 MG tablet TAKE 1 TABLET ONCE DAILY.  . fluticasone (FLONASE) 50 MCG/ACT nasal spray Place 1 spray into both nostrils daily.   Marland Kitchen latanoprost (XALATAN) 0.005 % ophthalmic solution Place 1 drop into both eyes at bedtime.  . Multiple Vitamins-Minerals (MULTIVITAMIN WITH MINERALS) tablet Take 1 tablet by mouth daily.  . pantoprazole (PROTONIX) 40 MG tablet TAKE 1 TABLET ONCE DAILY.  . SUMAtriptan (IMITREX) 50 MG tablet  Take 1 tablet (50 mg total) by mouth as needed.  . verapamil (CALAN-SR) 180 MG CR tablet TAKE ONE TABLET AT BEDTIME.  . [DISCONTINUED] clotrimazole-betamethasone (LOTRISONE) cream Apply 1 application topically 2 (two) times daily. (Patient not taking: Reported on 09/26/2020)   No facility-administered encounter medications on file as of 10/01/2020.    Allergies (verified) Penicillins   History: Past Medical History:  Diagnosis Date  . Apnea, sleep   . Barrett's esophagus   . Gout   . Hypertension   . Obesity    Past Surgical History:  Procedure Laterality Date  . CHOLECYSTECTOMY  08/29/2017  . EYE SURGERY Bilateral 2011-2012   catracts removed  . HERNIA REPAIR  2005   T. Carlton Adam MD  . TONSILECTOMY/ADENOIDECTOMY WITH MYRINGOTOMY  1944   Family History  Problem Relation Age of Onset  . Osteoporosis Mother   . Cancer Father 51       esophagus  . Crohn's disease Sister   . Birth defects Maternal Grandmother        colon  . Diabetes Paternal Grandmother    Social History   Socioeconomic History  . Marital status: Married    Spouse name: Inez Catalina  . Number of children: 1  . Years of education: Not on file  . Highest education level: Not on file  Occupational History  . Not on file  Tobacco Use  . Smoking status: Former Smoker    Packs/day: 1.00    Years: 15.00    Pack years: 15.00  . Smokeless tobacco: Never Used  Vaping  Use  . Vaping Use: Never used  Substance and Sexual Activity  . Alcohol use: Yes    Alcohol/week: 3.0 - 4.0 standard drinks    Types: 3 - 4 Standard drinks or equivalent per week    Comment: 2-3 beers a month  . Drug use: No  . Sexual activity: Not Currently  Other Topics Concern  . Not on file  Social History Narrative   Social History     Marital status: Married ,1986            Spouse name: Inez Catalina                    Years of education:                 Number of children: 1                Occupational History: Pharmacist, hospital     None on file       Social History Main Topics     Smoking status:                       Smokeless tobacco: Not on file                        Alcohol use: 3-4 drinks per week     Drug use: Unknown     Sexual activity: Not on file            Other Topics            Concern     None on file      Social History Narrative     None on file            Social Determinants of Health   Financial Resource Strain: Not on file  Food Insecurity: Not on file  Transportation Needs: Not on file  Physical Activity: Not on file  Stress: Not on file  Social Connections: Not on file    Tobacco Counseling Counseling given: Not Answered   Clinical Intake:  Pre-visit preparation completed: Yes  Pain : No/denies pain     BMI - recorded: 33 Nutritional Status: BMI > 30  Obese Nutritional Risks: None Diabetes: No  How often do you need to have someone help you when you read instructions, pamphlets, or other written materials from your doctor or pharmacy?: 1 - Never  Diabetic?no         Activities of Daily Living In your present state of health, do you have any difficulty performing the following activities: 10/01/2020  Hearing? N  Vision? Y  Comment some blurry vision  Difficulty concentrating or making decisions? Y  Comment some forgetfulness  Walking or climbing stairs? N  Dressing or bathing? N  Doing errands, shopping? N  Preparing Food and eating ? N  Using the Toilet? N  In the past six months, have you accidently leaked urine? N  Do you have problems with loss of bowel control? N  Managing your Medications? N  Managing your Finances? N  Housekeeping or managing your Housekeeping? N  Some recent data might be hidden    Patient Care Team: Mast, Man X, NP as PCP - General (Internal Medicine)  Indicate any recent Medical Services you may have received from other than [facility] providers in the past year (date may be approximate).     Assessment:   This is a routine wellness  examination  for [facility].  Hearing/Vision screen  Hearing Screening   125Hz  250Hz  500Hz  1000Hz  2000Hz  3000Hz  4000Hz  6000Hz  8000Hz   Right ear:           Left ear:           Comments: Patient has no hearing problems  Vision Screening Comments: Patient ha blurry vision from time to time. Patient had eye exam about 4 months ago. In beginning stages of glaucoma. Patient sees Dr. Valetta Close.  Dietary issues and exercise activities discussed: Current Exercise Habits: Home exercise routine  Goals    . Exercise 150 minutes per week (moderate activity)     Patient will walk in the mornings 15-30 minutes      Depression Screen PHQ 2/9 Scores 10/01/2020 07/11/2018 05/27/2017  PHQ - 2 Score 0 0 0    Fall Risk Fall Risk  10/01/2020 09/26/2020 05/24/2020 02/29/2020 02/15/2020  Falls in the past year? 0 0 0 0 0  Number falls in past yr: 0 0 0 0 0  Comment - - - - -  Injury with Fall? 0 - - - -  Comment - - - - -    FALL RISK PREVENTION PERTAINING TO THE HOME:  Any stairs in or around the home? Yes  If so, are there any without handrails? Yes  Home free of loose throw rugs in walkways, pet beds, electrical cords, etc? Yes  Adequate lighting in your home to reduce risk of falls? Yes   ASSISTIVE DEVICES UTILIZED TO PREVENT FALLS:  Life alert? No  Use of a cane, walker or w/c? No  Grab bars in the bathroom? Yes  Shower chair or bench in shower? No  Elevated toilet seat or a handicapped toilet? Yes   TIMED UP AND GO:  Was the test performed? No .    Cognitive Function: MMSE - Mini Mental State Exam 07/11/2018 05/27/2017  Orientation to time 5 4  Orientation to Place 5 5  Registration 3 3  Attention/ Calculation 5 5  Recall 3 3  Language- name 2 objects 2 2  Language- repeat 1 1  Language- follow 3 step command 3 2  Language- read & follow direction 1 1  Write a sentence 1 1  Copy design 1 1  Total score 30 28     6CIT Screen 10/01/2020  What Year? 0 points  What month? 0 points  What time? 0  points  Count back from 20 0 points  Months in reverse 0 points  Repeat phrase 0 points  Total Score 0    Immunizations Immunization History  Administered Date(s) Administered  . Fluad Quad(high Dose 65+) 06/07/2019  . Influenza Whole 05/26/2018  . Influenza, High Dose Seasonal PF 06/02/2017, 06/05/2020  . Influenza-Unspecified 06/24/2016  . Moderna Sars-Covid-2 Vaccination 08/28/2019, 09/25/2019, 07/02/2020  . Pneumococcal Conjugate-13 09/12/2014  . Pneumococcal Polysaccharide-23 12/17/2016  . Pneumococcal-Unspecified 12/17/2016  . Td 07/31/2016  . Zoster Recombinat (Shingrix) 08/24/2005, 04/18/2019, 07/04/2019    TDAP status: Up to date  Flu Vaccine status: Up to date  Pneumococcal vaccine status: Up to date  Covid-19 vaccine status: Completed vaccines  Qualifies for Shingles Vaccine? Yes   Zostavax completed No   Shingrix Completed?: Yes  Screening Tests Health Maintenance  Topic Date Due  . TETANUS/TDAP  07/31/2026  . INFLUENZA VACCINE  Completed  . COVID-19 Vaccine  Completed  . PNA vac Low Risk Adult  Completed    Health Maintenance  There are no preventive care reminders to display  for this patient.  Colorectal cancer screening: No longer required.   Lung Cancer Screening: (Low Dose CT Chest recommended if Age 24-80 years, 30 pack-year currently smoking OR have quit w/in 15years.) does not qualify.    Additional Screening:  Hepatitis C Screening: does not qualify;  Vision Screening: Recommended annual ophthalmology exams for early detection of glaucoma and other disorders of the eye. Is the patient up to date with their annual eye exam?  yes Who is the provider or what is the name of the office in which the patient attends annual eye exams? Dr. Valetta Close If pt is not established with a provider, would they like to be referred to a provider to establish care? No .   Dental Screening: Recommended annual dental exams for proper oral hygiene  Community  Resource Referral / Chronic Care Management: CRR required this visit?  No   CCM required this visit?  No      Plan:     I have personally reviewed and noted the following in the patient's chart:   . Medical and social history . Use of alcohol, tobacco or illicit drugs  . Current medications and supplements . Functional ability and status . Nutritional status . Physical activity . Advanced directives . List of other physicians . Hospitalizations, surgeries, and ER visits in previous 12 months . Vitals . Screenings to include cognitive, depression, and falls . Referrals and appointments  In addition, I have reviewed and discussed with patient certain preventive protocols, quality metrics, and best practice recommendations. A written personalized care plan for preventive services as well as general preventive health recommendations were provided to patient.     Lauree Chandler, NP   10/01/2020    Virtual Visit via Telephone Note  I connected with@ on 10/01/20 at  9:00 AM EST by telephone and verified that I am speaking with the correct person using two identifiers.  Location: Patient: home Provider: [facility]   I discussed the limitations, risks, security and privacy concerns of performing an evaluation and management service by telephone and the availability of in person appointments. I also discussed with the patient that there may be a patient responsible charge related to this service. The patient expressed understanding and agreed to proceed.   I discussed the assessment and treatment plan with the patient. The patient was provided an opportunity to ask questions and all were answered. The patient agreed with the plan and demonstrated an understanding of the instructions.   The patient was advised to call back or seek an in-person evaluation if the symptoms worsen or if the condition fails to improve as anticipated.  I provided 18 minutes of non-face-to-face time during  this encounter.  Carlos American. Harle Battiest Av[employer] printed and mailed

## 2020-10-01 NOTE — Patient Instructions (Signed)
Eric Chung , Thank you for taking time to come for your Medicare Wellness Visit. I appreciate your ongoing commitment to your health goals. Please review the following plan we discussed and let me know if I can assist you in the future.   Screening recommendations/referrals: Colonoscopy aged out Recommended yearly ophthalmology/optometry visit for glaucoma screening and checkup Recommended yearly dental visit for hygiene and checkup  Vaccinations: Influenza vaccine up to date Pneumococcal vaccine up to date Tdap vaccine up to date Shingles vaccine up to date    Advanced directives: on file.   Conditions/risks identified: advanced age, obesity, hypertension.   Next appointment: 1 year   Preventive Care 40 Years and Older, Male Preventive care refers to lifestyle choices and visits with your health care provider that can promote health and wellness. What does preventive care include?  A yearly physical exam. This is also called an annual well check.  Dental exams once or twice a year.  Routine eye exams. Ask your health care provider how often you should have your eyes checked.  Personal lifestyle choices, including:  Daily care of your teeth and gums.  Regular physical activity.  Eating a healthy diet.  Avoiding tobacco and drug use.  Limiting alcohol use.  Practicing safe sex.  Taking low doses of aspirin every day.  Taking vitamin and mineral supplements as recommended by your health care provider. What happens during an annual well check? The services and screenings done by your health care provider during your annual well check will depend on your age, overall health, lifestyle risk factors, and family history of disease. Counseling  Your health care provider may ask you questions about your:  Alcohol use.  Tobacco use.  Drug use.  Emotional well-being.  Home and relationship well-being.  Sexual activity.  Eating habits.  History of  falls.  Memory and ability to understand (cognition).  Work and work Statistician. Screening  You may have the following tests or measurements:  Height, weight, and BMI.  Blood pressure.  Lipid and cholesterol levels. These may be checked every 5 years, or more frequently if you are over 65 years old.  Skin check.  Lung cancer screening. You may have this screening every year starting at age 36 if you have a 30-pack-year history of smoking and currently smoke or have quit within the past 15 years.  Fecal occult blood test (FOBT) of the stool. You may have this test every year starting at age 2.  Flexible sigmoidoscopy or colonoscopy. You may have a sigmoidoscopy every 5 years or a colonoscopy every 10 years starting at age 109.  Prostate cancer screening. Recommendations will vary depending on your family history and other risks.  Hepatitis C blood test.  Hepatitis B blood test.  Sexually transmitted disease (STD) testing.  Diabetes screening. This is done by checking your blood sugar (glucose) after you have not eaten for a while (fasting). You may have this done every 1-3 years.  Abdominal aortic aneurysm (AAA) screening. You may need this if you are a current or former smoker.  Osteoporosis. You may be screened starting at age 60 if you are at high risk. Talk with your health care provider about your test results, treatment options, and if necessary, the need for more tests. Vaccines  Your health care provider may recommend certain vaccines, such as:  Influenza vaccine. This is recommended every year.  Tetanus, diphtheria, and acellular pertussis (Tdap, Td) vaccine. You may need a Td booster every 10 years.  Zoster vaccine. You may need this after age 91.  Pneumococcal 13-valent conjugate (PCV13) vaccine. One dose is recommended after age 67.  Pneumococcal polysaccharide (PPSV23) vaccine. One dose is recommended after age 54. Talk to your health care provider about  which screenings and vaccines you need and how often you need them. This information is not intended to replace advice given to you by your health care provider. Make sure you discuss any questions you have with your health care provider. Document Released: 09/06/2015 Document Revised: 04/29/2016 Document Reviewed: 06/11/2015 Elsevier Interactive Patient Education  2017 [place] Prevention in the Home Falls can cause injuries. They can happen to people of all ages. There are many things you can do to make your home safe and to help prevent falls. What can I do on the outside of my home?  Regularly fix the edges of walkways and driveways and fix any cracks.  Remove anything that might make you trip as you walk through a door, such as a raised step or threshold.  Trim any bushes or trees on the path to your home.  Use bright outdoor lighting.  Clear any walking paths of anything that might make someone trip, such as rocks or tools.  Regularly check to see if handrails are loose or broken. Make sure that both sides of any steps have handrails.  Any raised decks and porches should have guardrails on the edges.  Have any leaves, snow, or ice cleared regularly.  Use sand or salt on walking paths during winter.  Clean up any spills in your garage right away. This includes oil or grease spills. What can I do in the bathroom?  Use night lights.  Install grab bars by the toilet and in the tub and shower. Do not use towel bars as grab bars.  Use non-skid mats or decals in the tub or shower.  If you need to sit down in the shower, use a plastic, non-slip stool.  Keep the floor dry. Clean up any water that spills on the floor as soon as it happens.  Remove soap buildup in the tub or shower regularly.  Attach bath mats securely with double-sided non-slip rug tape.  Do not have throw rugs and other things on the floor that can make you trip. What can I do in the  bedroom?  Use night lights.  Make sure that you have a light by your bed that is easy to reach.  Do not use any sheets or blankets that are too big for your bed. They should not hang down onto the floor.  Have a firm chair that has side arms. You can use this for support while you get dressed.  Do not have throw rugs and other things on the floor that can make you trip. What can I do in the kitchen?  Clean up any spills right away.  Avoid walking on wet floors.  Keep items that you use a lot in easy-to-reach places.  If you need to reach something above you, use a strong step stool that has a grab bar.  Keep electrical cords out of the way.  Do not use floor polish or wax that makes floors slippery. If you must use wax, use non-skid floor wax.  Do not have throw rugs and other things on the floor that can make you trip. What can I do with my stairs?  Do not leave any items on the stairs.  Make sure that there are  handrails on both sides of the stairs and use them. Fix handrails that are broken or loose. Make sure that handrails are as long as the stairways.  Check any carpeting to make sure that it is firmly attached to the stairs. Fix any carpet that is loose or worn.  Avoid having throw rugs at the top or bottom of the stairs. If you do have throw rugs, attach them to the floor with carpet tape.  Make sure that you have a light switch at the top of the stairs and the bottom of the stairs. If you do not have them, ask someone to add them for you. What else can I do to help prevent falls?  Wear shoes that:  Do not have high heels.  Have rubber bottoms.  Are comfortable and fit you well.  Are closed at the toe. Do not wear sandals.  If you use a stepladder:  Make sure that it is fully opened. Do not climb a closed stepladder.  Make sure that both sides of the stepladder are locked into place.  Ask someone to hold it for you, if possible.  Clearly mark and make  sure that you can see:  Any grab bars or handrails.  First and last steps.  Where the edge of each step is.  Use tools that help you move around (mobility aids) if they are needed. These include:  Canes.  Walkers.  Scooters.  Crutches.  Turn on the lights when you go into a dark area. Replace any light bulbs as soon as they burn out.  Set up your furniture so you have a clear path. Avoid moving your furniture around.  If any of your floors are uneven, fix them.  If there are any pets around you, be aware of where they are.  Review your medicines with your doctor. Some medicines can make you feel dizzy. This can increase your chance of falling. Ask your doctor what other things that you can do to help prevent falls. This information is not intended to replace advice given to you by your health care provider. Make sure you discuss any questions you have with your health care provider. Document Released: 06/06/2009 Document Revised: 01/16/2016 Document Reviewed: 09/14/2014 Elsevier Interactive Patient Education  2017 [employer].

## 2020-10-01 NOTE — Telephone Encounter (Signed)
Mr. edna, grover are scheduled for a virtual visit with your provider today.    Just as we do with appointments in the office, we must obtain your consent to participate.  Your consent will be active for this visit and any virtual visit you may have with one of our providers in the next 365 days.    If you have a MyChart account, I can also send a copy of this consent to you electronically.  All virtual visits are billed to your insurance company just like a traditional visit in the office.  As this is a virtual visit, video technology does not allow for your provider to perform a traditional examination.  This may limit your provider's ability to fully assess your condition.  If your provider identifies any concerns that need to be evaluated in person or the need to arrange testing such as labs, EKG, etc, we will make arrangements to do so.    Although advances in technology are sophisticated, we cannot ensure that it will always work on either your end or our end.  If the connection with a video visit is poor, we may have to switch to a telephone visit.  With either a video or telephone visit, we are not always able to ensure that we have a secure connection.   I need to obtain your verbal consent now.   Are you willing to proceed with your visit today?   Dalen Henn[place] has provided verbal consent on 10/01/2020 for a virtual visit (video or telephone).   Carroll Kinds, [facility] 10/01/2020  8:49 AM

## 2020-10-01 NOTE — Progress Notes (Signed)
This service is provided via telemedicine  No vital signs collected/recorded due to the encounter was a telemedicine visit.   Location of patient (ex: home, work):  Home  Patient consents to a telephone visit:  Yes, see encounter dated 10/01/2020  Location of the provider (ex: office, home):  [place]  Name of any referring provider: Mast, Man X, NP  Names of all persons participating in the telemedicine service and their role in the encounter: Sherrie Mustache, Nurse Practitioner, Carroll Kinds, CMA, and patient.   Time spent on call: 10 minutes with medical assistant

## 2020-10-29 ENCOUNTER — Other Ambulatory Visit: Payer: Self-pay | Admitting: Nurse Practitioner

## 2020-12-03 ENCOUNTER — Other Ambulatory Visit: Payer: Self-pay | Admitting: Nurse Practitioner

## 2020-12-03 NOTE — Telephone Encounter (Signed)
Patient is requesting refill on medication "Omeprazole". Medication isnt listed on patient list. Medication pend and sent to PCP Mast, Man X, NP for approval.

## 2021-02-13 DIAGNOSIS — H401131 Primary open-angle glaucoma, bilateral, mild stage: Secondary | ICD-10-CM | POA: Diagnosis not present

## 2021-04-23 DIAGNOSIS — I2782 Chronic pulmonary embolism: Secondary | ICD-10-CM | POA: Diagnosis not present

## 2021-04-23 DIAGNOSIS — Z7901 Long term (current) use of anticoagulants: Secondary | ICD-10-CM | POA: Diagnosis not present

## 2021-04-23 DIAGNOSIS — I82512 Chronic embolism and thrombosis of left femoral vein: Secondary | ICD-10-CM | POA: Diagnosis not present

## 2021-04-23 DIAGNOSIS — Z6832 Body mass index (BMI) 32.0-32.9, adult: Secondary | ICD-10-CM | POA: Diagnosis not present

## 2021-04-23 DIAGNOSIS — E669 Obesity, unspecified: Secondary | ICD-10-CM | POA: Diagnosis not present

## 2021-05-11 ENCOUNTER — Other Ambulatory Visit: Payer: Self-pay | Admitting: Nurse Practitioner

## 2021-06-03 ENCOUNTER — Other Ambulatory Visit: Payer: Self-pay | Admitting: Nurse Practitioner

## 2021-06-04 DIAGNOSIS — E669 Obesity, unspecified: Secondary | ICD-10-CM | POA: Diagnosis not present

## 2021-06-04 DIAGNOSIS — I82509 Chronic embolism and thrombosis of unspecified deep veins of unspecified lower extremity: Secondary | ICD-10-CM | POA: Diagnosis not present

## 2021-06-04 DIAGNOSIS — J449 Chronic obstructive pulmonary disease, unspecified: Secondary | ICD-10-CM | POA: Diagnosis not present

## 2021-06-04 DIAGNOSIS — H409 Unspecified glaucoma: Secondary | ICD-10-CM | POA: Diagnosis not present

## 2021-06-04 DIAGNOSIS — I2782 Chronic pulmonary embolism: Secondary | ICD-10-CM | POA: Diagnosis not present

## 2021-06-04 DIAGNOSIS — E785 Hyperlipidemia, unspecified: Secondary | ICD-10-CM | POA: Diagnosis not present

## 2021-06-04 DIAGNOSIS — G43909 Migraine, unspecified, not intractable, without status migrainosus: Secondary | ICD-10-CM | POA: Diagnosis not present

## 2021-06-04 DIAGNOSIS — G4730 Sleep apnea, unspecified: Secondary | ICD-10-CM | POA: Diagnosis not present

## 2021-06-04 DIAGNOSIS — I10 Essential (primary) hypertension: Secondary | ICD-10-CM | POA: Diagnosis not present

## 2021-06-16 ENCOUNTER — Other Ambulatory Visit: Payer: Self-pay | Admitting: Nurse Practitioner

## 2021-06-16 NOTE — Telephone Encounter (Signed)
Called patient to schedule a follow-up as indicated at last OV in February. Patient was to have a 4 month follow-up.  Patient unable to make appointment for this week due to travels. Patient scheduled for routine follow-up on 06/26/2021

## 2021-06-26 ENCOUNTER — Encounter: Payer: Self-pay | Admitting: Nurse Practitioner

## 2021-06-26 ENCOUNTER — Other Ambulatory Visit: Payer: Self-pay

## 2021-06-26 ENCOUNTER — Encounter: Payer: Medicare PPO | Admitting: Nurse Practitioner

## 2021-06-26 NOTE — Assessment & Plan Note (Signed)
takes Flonase qd 

## 2021-06-26 NOTE — Assessment & Plan Note (Signed)
not using BiPAP 

## 2021-06-26 NOTE — Assessment & Plan Note (Signed)
Hx of PE/DVT(L leg), takes Eliquis 2.5mg  bid, update CBC/diff

## 2021-06-26 NOTE — Assessment & Plan Note (Signed)
not taking statin. Update lipid panel.

## 2021-06-26 NOTE — Assessment & Plan Note (Signed)
Hgb a1c 5.9 10/16/19, update Hgb a1c, Vit B12, Vit D level, TSH

## 2021-06-26 NOTE — Assessment & Plan Note (Signed)
Barrett"s,  takes Pantoprazole 40mg  qd, Pepcid, declined GI f/u. Hgb 15.0 04/23/21

## 2021-06-26 NOTE — Progress Notes (Signed)
Location:   clinic [place]   Place of Service:  Clinic (12) Provider: Marlana Latus NP  Code Status: DNR Goals of Care: [place] 06/26/2021  Does Patient Have a Medical Advance Directive? Yes  Type of Advance Directive Living will  Does patient want to make changes to medical advance directive? No - Patient declined  Copy of [place] in Chart? -  Would patient like information on creating a medical advance directive? -     Chief Complaint  Patient presents with   Medical Management of Chronic Issues    8 month follow-up. Discuss need for pneumonia vaccine, flu, and covid # 4     HPI: Patient is a 86 y.o. male seen today for medical management of chronic diseases.     Gout, takes Allopurinol 179m qd, R ankle. Uric acid 4.6 10/06/19             Hx of PE/DVT(L leg), takes Eliquis 2.565mbid,              Allergic rhinitis, takes Flonase qd             GERD, Barrett"s,  takes Pantoprazole 4059md, Pepcid, declined GI f/u. Hgb 15.0 04/23/21             HTN, takes Verapamil 180m62m. Bun/creat 16/1.11 10/05/19             OSA, not using BiPAP             Migraine with Aura, uses prn Imitrex.              Hyperlipidemia, not taking statin.              Prediabetes, Hgb a1c 5.9 10/16/19  Past Medical History:  Diagnosis Date   Apnea, sleep    Barrett's esophagus    Gout    Hypertension    Obesity     Past Surgical History:  Procedure Laterality Date   CHOLECYSTECTOMY  08/29/2017   EYE SURGERY Bilateral 2011-2012   catracts removed   HERNIA REPAIR  2005   T. ThorCarlton Adam  TONSILECTOMY/ADENOIDECTOMY WITH MYRINGOTOMY  [id number]  Allergen Reactions   Penicillins     Has patient had a PCN reaction causing immediate rash, facial/tongue/throat swelling, SOB or lightheadedness with hypotension: yes, rash Has patient had a PCN reaction causing severe rash involving mucus membranes or skin necrosis: no Has patient had a PCN reaction that  required hospitalization: no Has patient had a PCN reaction occurring within the last 10 years: No If all of the above answers are "NO", then may proceed with Cephalosporin use.     Allergies as of 06/26/2021       Reactions   Penicillins    Has patient had a PCN reaction causing immediate rash, facial/tongue/throat swelling, SOB or lightheadedness with hypotension: yes, rash Has patient had a PCN reaction causing severe rash involving mucus membranes or skin necrosis: no Has patient had a PCN reaction that required hospitalization: no Has patient had a PCN reaction occurring within the last 10 years: No If all of the above answers are "NO", then may proceed with Cephalosporin use.        Medication List        Accurate as of June 26, 2021  2:54 PM. If you have any questions, ask your nurse or doctor.          allopurinol 100 MG tablet  Commonly known as: ZYLOPRIM TAKE 1 TABLET BY MOUTH TWICE DAILY.   Eliquis 5 MG Tabs tablet Generic drug: apixaban Take 2.5 mg by mouth 2 (two) times a day.   famotidine 20 MG tablet Commonly known as: PEPCID TAKE 1 TABLET ONCE DAILY.   fluticasone 50 MCG/ACT nasal spray Commonly known as: FLONASE Place 1 spray into both nostrils daily.   latanoprost 0.005 % ophthalmic solution Commonly known as: XALATAN Place 1 drop into both eyes at bedtime.   multivitamin with minerals tablet Take 1 tablet by mouth daily.   omeprazole 20 MG capsule Commonly known as: PRILOSEC TAKE ONE CAPSULE BY MOUTH ONCE DAILY.   pantoprazole 40 MG tablet Commonly known as: PROTONIX TAKE 1 TABLET ONCE DAILY.   SUMAtriptan 50 MG tablet Commonly known as: IMITREX Take 1 tablet (50 mg total) by mouth as needed.   verapamil 180 MG CR tablet Commonly known as: CALAN-SR TAKE ONE TABLET BY MOUTH AT BEDTIME **NEED OFFICE VISIT**        Review of Systems:  Review of Systems  Health Maintenance  Topic Date Due   Pneumonia Vaccine 40+ Years old (2  - PPSV23 if available, else PCV20) 12/17/2017   COVID-19 Vaccine (4 - Booster for Moderna series) 08/27/2020   INFLUENZA VACCINE  03/24/2021   TETANUS/TDAP  07/31/2026   Zoster Vaccines- Shingrix  Completed   HPV VACCINES  Aged Out    Physical Exam: Vitals:   06/26/21 1400  Height: _0  (1.803 m)   Body mass index is 33.98 kg/m. Physical Exam  Labs reviewed: Basic Metabolic Panel: No results for input(s): NA, K, CL, CO2, GLUCOSE, BUN, CREATININE, CALCIUM, MG, PHOS, TSH in the last 8760 hours. Liver Function Tests: No results for input(s): AST, ALT, ALKPHOS, BILITOT, PROT, ALBUMIN in the last 8760 hours. No results for input(s): LIPASE, AMYLASE in the last 8760 hours. No results for input(s): AMMONIA in the last 8760 hours. CBC: No results for input(s): WBC, NEUTROABS, HGB, HCT, MCV, PLT in the last 8760 hours. Lipid Panel: No results for input(s): CHOL, HDL, LDLCALC, TRIG, CHOLHDL, LDLDIRECT in the last 8760 hours. Lab Results  Component Value Date   HGBA1C 5.9 (H) 10/05/2019    Procedures since last visit: No results found.  Assessment/Plan  Gout of ankle  takes Allopurinol 168m qd, R ankle. Uric acid 4.6 10/06/19, update uric acid   VTE (venous thromboembolism) Hx of PE/DVT(L leg), takes Eliquis 2.53mbid, update CBC/diff  Allergic rhinitis takes Flonase qd  Barrett's esophagus Barrett"s,  takes Pantoprazole 4075md, Pepcid, declined GI f/u. Hgb 15.0 04/23/21  Hypertension  takes Verapamil 180m1m. Bun/creat 16/1.11 10/05/19, update CMP/eGFR  Sleep apnea in adult not using BiPAP  Migraine headache with aura Migraine with Aura, uses prn Imitrex.   Hyperlipidemia  not taking statin. Update lipid panel.   Prediabetes Hgb a1c 5.9 10/16/19, update Hgb a1c, Vit B12, Vit D level, TSH   Labs/tests ordered:    Next appt:  10/03/2021  This encounter was created in error - please disregard.

## 2021-06-26 NOTE — Assessment & Plan Note (Signed)
takes Allopurinol 100mg  qd, R ankle. Uric acid 4.6 10/06/19, update uric acid

## 2021-06-26 NOTE — Assessment & Plan Note (Signed)
takes Verapamil 136m qd. Bun/creat 16/1.11 10/05/19, update CMP/eGFR

## 2021-06-26 NOTE — Assessment & Plan Note (Signed)
Migraine with Aura, uses prn Imitrex.

## 2021-07-02 ENCOUNTER — Other Ambulatory Visit: Payer: Self-pay | Admitting: Nurse Practitioner

## 2021-07-03 ENCOUNTER — Encounter: Payer: Self-pay | Admitting: Nurse Practitioner

## 2021-07-03 ENCOUNTER — Other Ambulatory Visit: Payer: Self-pay

## 2021-07-03 ENCOUNTER — Non-Acute Institutional Stay: Payer: Medicare PPO | Admitting: Nurse Practitioner

## 2021-07-03 DIAGNOSIS — I459 Conduction disorder, unspecified: Secondary | ICD-10-CM

## 2021-07-03 DIAGNOSIS — G4730 Sleep apnea, unspecified: Secondary | ICD-10-CM

## 2021-07-03 DIAGNOSIS — I493 Ventricular premature depolarization: Secondary | ICD-10-CM | POA: Insufficient documentation

## 2021-07-03 DIAGNOSIS — J309 Allergic rhinitis, unspecified: Secondary | ICD-10-CM | POA: Diagnosis not present

## 2021-07-03 DIAGNOSIS — I10 Essential (primary) hypertension: Secondary | ICD-10-CM

## 2021-07-03 DIAGNOSIS — R7303 Prediabetes: Secondary | ICD-10-CM

## 2021-07-03 DIAGNOSIS — M10071 Idiopathic gout, right ankle and foot: Secondary | ICD-10-CM

## 2021-07-03 DIAGNOSIS — K2270 Barrett's esophagus without dysplasia: Secondary | ICD-10-CM

## 2021-07-03 DIAGNOSIS — G43109 Migraine with aura, not intractable, without status migrainosus: Secondary | ICD-10-CM

## 2021-07-03 DIAGNOSIS — I8290 Acute embolism and thrombosis of unspecified vein: Secondary | ICD-10-CM

## 2021-07-03 DIAGNOSIS — E782 Mixed hyperlipidemia: Secondary | ICD-10-CM

## 2021-07-03 DIAGNOSIS — R300 Dysuria: Secondary | ICD-10-CM | POA: Insufficient documentation

## 2021-07-03 NOTE — Progress Notes (Signed)
Location:   clinic [facility]   Place of Service:  Clinic (12) Provider: Marlana Latus NP  Code Status: DNR Goals of Care: [place] 07/03/2021  Does Patient Have a Medical Advance Directive? Yes  Type of Advance Directive Living will  Does patient want to make changes to medical advance directive? No - Patient declined  Copy of [place] in Chart? -  Would patient like information on creating a medical advance directive? -     Chief Complaint  Patient presents with   Medical Management of Chronic Issues    8 month follow-up. Burning when urinating x couple of months. Phlegm and coughing for a while. Discuss need for pneu vaccine and covid #4 or postpone/exclude if patient refuses.      HPI: Patient is a 86 y.o. male seen today for medical management of chronic diseases.    C/o dysuria, sometimes, not associated with increased urinary frequency or urgency, no blood seen in urine, denied abd pain, nausea, vomiting, or fever.  Gout, takes Allopurinol 100mg  qd, R ankle. Uric acid 4.6 10/06/19             Hx of PE/DVT(L leg), takes Eliquis 2.5mg  bid,              Allergic rhinitis, takes Flonase qd             GERD, Barrett"s,  takes Pantoprazole 40mg  qd, Pepcid, declined GI f/u. Hgb 15.0 04/23/21             HTN, takes Verapamil 180mg  qd. Bun/creat 16/1.11 10/05/19             OSA, not using BiPAP             Migraine with Aura, uses prn Imitrex. Last episode this am.              Hyperlipidemia, not taking statin.              Prediabetes, Hgb a1c 5.9 10/16/19  Past Medical History:  Diagnosis Date   Apnea, sleep    Barrett's esophagus    Gout    Hypertension    Obesity     Past Surgical History:  Procedure Laterality Date   CHOLECYSTECTOMY  08/29/2017   EYE SURGERY Bilateral 2011-2012   catracts removed   HERNIA REPAIR  2005   T. Carlton Adam MD   TONSILECTOMY/ADENOIDECTOMY WITH MYRINGOTOMY  [id number]    Allergies  Allergen Reactions   Penicillins      Has patient had a PCN reaction causing immediate rash, facial/tongue/throat swelling, SOB or lightheadedness with hypotension: yes, rash Has patient had a PCN reaction causing severe rash involving mucus membranes or skin necrosis: no Has patient had a PCN reaction that required hospitalization: no Has patient had a PCN reaction occurring within the last 10 years: No If all of the above answers are "NO", then may proceed with Cephalosporin use.     Allergies as of 07/03/2021       Reactions   Penicillins    Has patient had a PCN reaction causing immediate rash, facial/tongue/throat swelling, SOB or lightheadedness with hypotension: yes, rash Has patient had a PCN reaction causing severe rash involving mucus membranes or skin necrosis: no Has patient had a PCN reaction that required hospitalization: no Has patient had a PCN reaction occurring within the last 10 years: No If all of the above answers are "NO", then may proceed with Cephalosporin use.  Medication List        Accurate as of July 03, 2021 11:59 PM. If you have any questions, ask your nurse or doctor.          STOP taking these medications    famotidine 20 MG tablet Commonly known as: PEPCID Stopped by: Mazi Schuff X Yeilin Zweber, NP   pantoprazole 40 MG tablet Commonly known as: PROTONIX Stopped by: Ethaniel Garfield X Alekhya Gravlin, NP       TAKE these medications    allopurinol 100 MG tablet Commonly known as: ZYLOPRIM TAKE ONE TABLET BY MOUTH TWICE DAILY   Eliquis 5 MG Tabs tablet Generic drug: apixaban Take 2.5 mg by mouth 2 (two) times a day.   fluticasone 50 MCG/ACT nasal spray Commonly known as: FLONASE Place 1 spray into both nostrils daily.   ibuprofen 200 MG tablet Commonly known as: ADVIL Take 200 mg by mouth as needed.   latanoprost 0.005 % ophthalmic solution Commonly known as: XALATAN Place 1 drop into both eyes at bedtime.   multivitamin with minerals tablet Take 1 tablet by mouth daily.    omeprazole 20 MG capsule Commonly known as: PRILOSEC TAKE ONE CAPSULE BY MOUTH ONCE DAILY.   SUMAtriptan 50 MG tablet Commonly known as: IMITREX Take 1 tablet (50 mg total) by mouth as needed.   verapamil 180 MG CR tablet Commonly known as: CALAN-SR TAKE ONE TABLET BY MOUTH AT BEDTIME **NEED OFFICE VISIT**        Review of Systems:  Review of Systems  Constitutional:  Negative for activity change, appetite change and fever.       #8Ibs weight loss in the past 8 months.   HENT:  Positive for hearing loss. Negative for congestion, rhinorrhea and voice change.   Eyes:  Negative for visual disturbance.  Respiratory:  Positive for cough. Negative for chest tightness, shortness of breath and wheezing.        Occasional cough, phlegm sometimes, mostly in am, sometime at night.   Cardiovascular:  Negative for chest pain, palpitations and leg swelling.  Gastrointestinal:  Negative for abdominal pain and constipation.  Genitourinary:  Positive for dysuria. Negative for frequency and urgency.  Musculoskeletal:  Positive for arthralgias and gait problem.  Skin:  Negative for color change.       Rosacea nose.   Neurological:  Negative for facial asymmetry, speech difficulty, weakness and light-headedness.       Migraine headache this morning.   Psychiatric/Behavioral:  Negative for behavioral problems and sleep disturbance. The patient is not nervous/anxious.    Health Maintenance  Topic Date Due   Pneumonia Vaccine 79+ Years old (2 - PPSV23 if available, else PCV20) 12/17/2017   COVID-19 Vaccine (4 - Booster for Moderna series) 08/27/2020   TETANUS/TDAP  07/31/2026   INFLUENZA VACCINE  Completed   Zoster Vaccines- Shingrix  Completed   HPV VACCINES  Aged Out    Physical Exam: Vitals:   07/03/21 0905  BP: 132/78  Pulse: (!) 50  Temp: (!) 97.4 F (36.3 C)  TempSrc: Temporal  SpO2: 94%  Weight: 235 lb (106.6 kg)  Height: 5\' 11"  (1.803 m)   Body mass index is 32.78  kg/m. Physical Exam Vitals and nursing note reviewed.  Constitutional:      Appearance: Normal appearance.  HENT:     Head: Normocephalic and atraumatic.     Nose: No congestion or rhinorrhea.     Mouth/Throat:     Mouth: Mucous membranes are moist.  Eyes:  Extraocular Movements: Extraocular movements intact.     Conjunctiva/sclera: Conjunctivae normal.     Pupils: Pupils are equal, round, and reactive to light.  Cardiovascular:     Rate and Rhythm: Normal rate and regular rhythm.     Heart sounds: No murmur heard.    Comments: Skip heart beats Pulmonary:     Effort: Pulmonary effort is normal.     Breath sounds: No rales.  Abdominal:     General: Bowel sounds are normal.     Palpations: Abdomen is soft.     Tenderness: There is no abdominal tenderness. There is no right CVA tenderness, left CVA tenderness, guarding or rebound.  Musculoskeletal:     Cervical back: Normal range of motion and neck supple.     Right lower leg: No edema.     Left lower leg: No edema.     Comments: Trace edema BLE  Skin:    General: Skin is warm and dry.     Comments: Dark redness in toes, Left great toe>R great toe, better after elevation. rosacea nose  Neurological:     General: No focal deficit present.     Mental Status: He is alert and oriented to person, place, and time. Mental status is at baseline.     Cranial Nerves: No cranial nerve deficit.     Sensory: No sensory deficit.     Motor: No weakness.     Coordination: Coordination normal.     Gait: Gait abnormal.  Psychiatric:        Mood and Affect: Mood normal.        Behavior: Behavior normal.        Thought Content: Thought content normal.        Judgment: Judgment normal.    Labs reviewed: Basic Metabolic Panel: No results for input(s): NA, K, CL, CO2, GLUCOSE, BUN, CREATININE, CALCIUM, MG, PHOS, TSH in the last 8760 hours. Liver Function Tests: No results for input(s): AST, ALT, ALKPHOS, BILITOT, PROT, ALBUMIN in the  last 8760 hours. No results for input(s): LIPASE, AMYLASE in the last 8760 hours. No results for input(s): AMMONIA in the last 8760 hours. CBC: No results for input(s): WBC, NEUTROABS, HGB, HCT, MCV, PLT in the last 8760 hours. Lipid Panel: No results for input(s): CHOL, HDL, LDLCALC, TRIG, CHOLHDL, LDLDIRECT in the last 8760 hours. Lab Results  Component Value Date   HGBA1C 5.9 (H) 10/05/2019    Procedures since last visit: No results found.  Assessment/Plan  Gout of ankle takes Allopurinol 100mg  qd, R ankle. Uric acid 4.6 10/06/19  VTE (venous thromboembolism)  takes Eliquis 2.5mg  bid  Allergic rhinitis  takes Flonase qd  Barrett's esophagus Barrett"s,  takes Pantoprazole 40mg  qd, Pepcid, declined GI f/u. Hgb 15.0 04/23/21  Hypertension  takes Verapamil 180mg  qd. Bun/creat 16/1.11 10/05/19  Sleep apnea in adult not using BiPAP  Migraine headache with aura  uses prn Imitrex.   Hyperlipidemia not taking statin  Prediabetes Hgb a1c 5.9 10/16/19  Skipped heart beats Asymptomatic, EKG shoed frequent PVCs, cardiology referral. The patient is instructed to ED if symptoms of chest pain/pressures, SOB, palpitation occur.   Dysuria Urine culture to r/o UTI, encourage oral fluid intake.   Labs/tests ordered:  UA C/S, EKG  Next appt: 6 months

## 2021-07-03 NOTE — Assessment & Plan Note (Addendum)
takes Verapamil 180mg  qd. Bun/creat 16/1.11 10/05/19

## 2021-07-03 NOTE — Assessment & Plan Note (Signed)
Barrett"s,  takes Pantoprazole 40mg  qd, Pepcid, declined GI f/u. Hgb 15.0 04/23/21

## 2021-07-03 NOTE — Assessment & Plan Note (Addendum)
takes Eliquis 2.5mg  bid

## 2021-07-03 NOTE — Assessment & Plan Note (Signed)
not using BiPAP 

## 2021-07-03 NOTE — Assessment & Plan Note (Addendum)
Hgb a1c 5.9 10/16/19

## 2021-07-03 NOTE — Assessment & Plan Note (Signed)
uses prn Imitrex.  

## 2021-07-03 NOTE — Assessment & Plan Note (Addendum)
Asymptomatic, EKG shoed frequent PVCs, cardiology referral. The patient is instructed to ED if symptoms of chest pain/pressures, SOB, palpitation occur.

## 2021-07-03 NOTE — Assessment & Plan Note (Addendum)
not taking statin.  

## 2021-07-03 NOTE — Assessment & Plan Note (Addendum)
takes Allopurinol 100mg qd, R ankle. Uric acid 4.6 10/06/19 

## 2021-07-03 NOTE — Assessment & Plan Note (Signed)
takes Flonase qd 

## 2021-07-03 NOTE — Assessment & Plan Note (Signed)
Urine culture to r/o UTI, encourage oral fluid intake.

## 2021-07-04 ENCOUNTER — Encounter: Payer: Self-pay | Admitting: Nurse Practitioner

## 2021-07-06 NOTE — Progress Notes (Signed)
Cardiology Office Note:    Date:  07/07/2021   ID:  Eric Chung, DOB [birth year], MRN [id number]  PCP:  Mast, Man X, NP  Cardiologist:  Early Osmond, MD  Electrophysiologist:  None   Referring MD: Mast, Man X, NP   Chief Complaint/Reason for Referral: Palpitations   History of Present Illness:    PROBLEM LIST: 1.  DNR 2.  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  Gout 4.  GERD and Barrett's esophagus 5.  Hypertension 6.  Hyperlipidemia 7.  OSA not on CPAP or BIPAP   Eric Chung is a 86 y.o. male with the indicated history the patient was recently seen by her primary care provider.  He was doing relatively well.  He had reported occasional skipped heartbeats.  His EKG demonstrated sinus rhythm with right bundle branch block, left anterior fascicular block, and PVCs.  He denies any palpitations, paroxysmal nocturnal dyspnea, exertional chest pain, presyncope, syncope, peripheral edema, orthopnea, signs or symptoms of stroke, or severe bleeding.  He has required no emergency room visits or hospitalizations he tells me he had extensive cardiac evaluation some years ago around the time of a hernia operation.  The results of that are relatively benign (results not available).  Past Medical History:  Diagnosis Date   Apnea, sleep    Barrett's esophagus    Gout    Hypertension    Obesity     Past Surgical History:  Procedure Laterality Date   CHOLECYSTECTOMY  08/29/2017   EYE SURGERY Bilateral 2011-2012   catracts removed   HERNIA REPAIR  2005   T. Carlton Adam MD   TONSILECTOMY/ADENOIDECTOMY WITH MYRINGOTOMY  1944    Current Medications: Current Meds  Medication Sig   allopurinol (ZYLOPRIM) 100 MG tablet TAKE ONE TABLET BY MOUTH TWICE DAILY   ELIQUIS 5 MG TABS tablet Take 2.5 mg by mouth 2 (two) times a day.    fluticasone (FLONASE) 50 MCG/ACT nasal spray Place 1 spray into both nostrils daily.    ibuprofen (ADVIL) 200 MG tablet  Take 200 mg by mouth as needed.   latanoprost (XALATAN) 0.005 % ophthalmic solution Place 1 drop into both eyes at bedtime.   Multiple Vitamins-Minerals (MULTIVITAMIN WITH MINERALS) tablet Take 1 tablet by mouth daily.   omeprazole (PRILOSEC) 20 MG capsule TAKE ONE CAPSULE BY MOUTH ONCE DAILY.   SUMAtriptan (IMITREX) 50 MG tablet Take 1 tablet (50 mg total) by mouth as needed.   verapamil (CALAN-SR) 180 MG CR tablet TAKE ONE TABLET BY MOUTH AT BEDTIME **NEED OFFICE VISIT**     Allergies:    Allergies  Allergen Reactions   Penicillins     Has patient had a PCN reaction causing immediate rash, facial/tongue/throat swelling, SOB or lightheadedness with hypotension: yes, rash Has patient had a PCN reaction causing severe rash involving mucus membranes or skin necrosis: no Has patient had a PCN reaction that required hospitalization: no Has patient had a PCN reaction occurring within the last 10 years: No If all of the above answers are "NO", then may proceed with Cephalosporin use.     Social History   Tobacco Use   Smoking status: Former    Packs/day: 1.00    Years: 15.00    Pack years: 15.00    Types: Cigarettes    Quit date: 08/24/1984    Years since quitting: 36.8   Smokeless tobacco: Never  Vaping Use   Vaping Use: Never used  Substance Use Topics  Alcohol use: Yes    Alcohol/week: 3.0 - 4.0 standard drinks    Types: 3 - 4 Standard drinks or equivalent per week    Comment: 2-3 beers a month   Drug use: No     Family History: Family History  Problem Relation Age of Onset   Osteoporosis Mother    Cancer Father 78       esophagus   Crohn's disease Sister    Birth defects Maternal Grandmother        colon   Diabetes Paternal Grandmother      ROS:   Please see the history of present illness.    All other systems reviewed and are negative.  EKGs/Labs/Other Studies Reviewed:    The following studies were reviewed today:  EKG: EKG here demonstrates sinus rhythm  with right bundle branch block left anterior fascicular block, and PACs and PVCs.  Imaging studies that I have independently reviewed today: No cardiac imaging available.  Recent Labs: Labs from August 2022 demonstrated a hemoglobin of 15, platelets of 233, creatinine 1.18, AST within normal limits, sodium 138, potassium 4.3  Recent Lipid Panel    Component Value Date/Time   CHOL 223 (H) 10/05/2019 0700   TRIG 153 (H) 10/05/2019 0700   HDL 43 10/05/2019 0700   CHOLHDL 5.2 (H) 10/05/2019 0700   LDLCALC 152 (H) 10/05/2019 0700    Physical Exam:    VS:  BP 122/78   Pulse 77   Ht 5\' 11"  (1.803 m)   Wt 236 lb 9.6 oz (107.3 kg)   SpO2 95%   BMI 33.00 kg/m     Wt Readings from Last 5 Encounters:  07/07/21 236 lb 9.6 oz (107.3 kg)  07/03/21 235 lb (106.6 kg)  09/26/20 243 lb 9.6 oz (110.5 kg)  05/24/20 252 lb 6.4 oz (114.5 kg)  02/29/20 243 lb 9.6 oz (110.5 kg)    GENERAL:  No apparent distress, AOx3 HEENT:  No carotid bruits, +2 carotid impulses, no scleral icterus CAR: RRR no murmurs, gallops, rubs, or thrills RES:  Clear to auscultation bilaterally ABD:  Soft, nontender, nondistended, positive bowel sounds x 4 VASC:  +2 radial pulses, +2 carotid pulses, palpable pedal pulses NEURO:  CN 2-12 grossly intact; motor and sensory grossly intact PSYCH:  No active depression or anxiety EXT:  No edema, ecchymosis, or cyanosis  ASSESSMENT:    1. Palpitations   2. DNR (do not resuscitate)   3. Primary hypertension   4. Mixed hyperlipidemia   5. VTE (venous thromboembolism)    PLAN:    Palpitations Will obtain echocardiogram to evaluate further.  He is having no signs or symptoms suggestive of a malignant arrhythmia.  He is relatively asymptomatic.  If this changes we could obtain a monitor.  For now we will just monitor his symptoms.  Certainly if he were to develop atrial fibrillation which she is at risk for he is already anticoagulated..  Follow up in 6 months or earlier if  needed.  DNR (do not resuscitate) Given code status, no aggressive measures will be pursued.  Primary hypertension Blood pressures well controlled on his current regimen.  Mixed hyperlipidemia Managed by PCP.  VTE (venous thromboembolism) His Eliquis is dosed appropriately for renal function and weight.   Shared Decision Making/Informed Consent:       Medication Adjustments/Labs and Tests Ordered: Current medicines are reviewed at length with the patient today.  Concerns regarding medicines are outlined above.   Orders Placed This Encounter  Procedures   EKG 12-Lead   ECHOCARDIOGRAM COMPLETE     No orders of the defined types were placed in this encounter.   Patient Instructions  Medication Instructions:  No changes *If you need a refill on your cardiac medications before your next appointment, please call your pharmacy*   Lab Work: none If you have labs (blood work) drawn today and your tests are completely normal, you will receive your results only by: [place] (if you have MyChart) OR A paper copy in the mail If you have any lab test that is abnormal or we need to change your treatment, we will call you to review the results.   Testing/Procedures: Your physician has requested that you have an echocardiogram. Echocardiography is a painless test that uses sound waves to create images of your heart. It provides your doctor with information about the size and shape of your heart and how well your heart's chambers and valves are working. This procedure takes approximately one hour. There are no restrictions for this procedure.   Follow-Up: At [facility], you and your health needs are our priority.  As part of our continuing mission to provide you with exceptional heart care, we have created designated Provider Care Teams.  These Care Teams include your primary Cardiologist (physician) and Advanced Practice Providers (APPs -  Physician Assistants and Nurse  Practitioners) who all work together to provide you with the care you need, when you need it.  We recommend signing up for the patient portal called "MyChart".  Sign up information is provided on this After Visit Summary.  MyChart is used to connect with patients for Virtual Visits (Telemedicine).  Patients are able to view lab/test results, encounter notes, upcoming appointments, etc.  Non-urgent messages can be sent to your provider as well.   To learn more about what you can do with MyChart, go to [email or web address].    Your next appointment:   6 month(s)  The format for your next appointment:   In Person  Provider:   Early Osmond, MD     Other Instructions

## 2021-07-07 ENCOUNTER — Encounter: Payer: Self-pay | Admitting: Internal Medicine

## 2021-07-07 ENCOUNTER — Other Ambulatory Visit: Payer: Self-pay

## 2021-07-07 ENCOUNTER — Ambulatory Visit: Payer: Medicare PPO | Admitting: Internal Medicine

## 2021-07-07 VITALS — BP 122/78 | HR 77 | Ht 71.0 in | Wt 236.6 lb

## 2021-07-07 DIAGNOSIS — R002 Palpitations: Secondary | ICD-10-CM

## 2021-07-07 DIAGNOSIS — I8290 Acute embolism and thrombosis of unspecified vein: Secondary | ICD-10-CM

## 2021-07-07 DIAGNOSIS — Z66 Do not resuscitate: Secondary | ICD-10-CM | POA: Diagnosis not present

## 2021-07-07 DIAGNOSIS — I10 Essential (primary) hypertension: Secondary | ICD-10-CM

## 2021-07-07 DIAGNOSIS — E782 Mixed hyperlipidemia: Secondary | ICD-10-CM

## 2021-07-07 NOTE — Patient Instructions (Signed)
Medication Instructions:  No changes *If you need a refill on your cardiac medications before your next appointment, please call your pharmacy*   Lab Work: none If you have labs (blood work) drawn today and your tests are completely normal, you will receive your results only by: [place] (if you have MyChart) OR A paper copy in the mail If you have any lab test that is abnormal or we need to change your treatment, we will call you to review the results.   Testing/Procedures: Your physician has requested that you have an echocardiogram. Echocardiography is a painless test that uses sound waves to create images of your heart. It provides your doctor with information about the size and shape of your heart and how well your heart's chambers and valves are working. This procedure takes approximately one hour. There are no restrictions for this procedure.   Follow-Up: At [facility], you and your health needs are our priority.  As part of our continuing mission to provide you with exceptional heart care, we have created designated Provider Care Teams.  These Care Teams include your primary Cardiologist (physician) and Advanced Practice Providers (APPs -  Physician Assistants and Nurse Practitioners) who all work together to provide you with the care you need, when you need it.  We recommend signing up for the patient portal called "MyChart".  Sign up information is provided on this After Visit Summary.  MyChart is used to connect with patients for Virtual Visits (Telemedicine).  Patients are able to view lab/test results, encounter notes, upcoming appointments, etc.  Non-urgent messages can be sent to your provider as well.   To learn more about what you can do with MyChart, go to [email or web address].    Your next appointment:   6 month(s)  The format for your next appointment:   In Person  Provider:   Early Osmond, MD     Other Instructions

## 2021-07-08 ENCOUNTER — Encounter: Payer: Self-pay | Admitting: Nurse Practitioner

## 2021-07-08 ENCOUNTER — Other Ambulatory Visit: Payer: Medicare PPO

## 2021-07-08 DIAGNOSIS — R300 Dysuria: Secondary | ICD-10-CM

## 2021-07-18 ENCOUNTER — Other Ambulatory Visit: Payer: Self-pay | Admitting: Nurse Practitioner

## 2021-07-30 ENCOUNTER — Ambulatory Visit (HOSPITAL_COMMUNITY): Payer: Medicare PPO | Attending: Cardiology

## 2021-07-30 ENCOUNTER — Other Ambulatory Visit: Payer: Self-pay

## 2021-07-30 DIAGNOSIS — E782 Mixed hyperlipidemia: Secondary | ICD-10-CM | POA: Diagnosis not present

## 2021-07-30 DIAGNOSIS — Z66 Do not resuscitate: Secondary | ICD-10-CM | POA: Diagnosis not present

## 2021-07-30 DIAGNOSIS — I8290 Acute embolism and thrombosis of unspecified vein: Secondary | ICD-10-CM | POA: Diagnosis not present

## 2021-07-30 DIAGNOSIS — R002 Palpitations: Secondary | ICD-10-CM

## 2021-07-30 DIAGNOSIS — I10 Essential (primary) hypertension: Secondary | ICD-10-CM | POA: Diagnosis not present

## 2021-07-30 LAB — ECHOCARDIOGRAM COMPLETE
Area-P 1/2: 2.6 cm2
P 1/2 time: 820 msec
S' Lateral: 4.7 cm

## 2021-08-11 ENCOUNTER — Telehealth: Payer: Self-pay | Admitting: *Deleted

## 2021-08-11 DIAGNOSIS — H04123 Dry eye syndrome of bilateral lacrimal glands: Secondary | ICD-10-CM | POA: Diagnosis not present

## 2021-08-11 DIAGNOSIS — H401131 Primary open-angle glaucoma, bilateral, mild stage: Secondary | ICD-10-CM | POA: Diagnosis not present

## 2021-08-11 MED ORDER — METOPROLOL SUCCINATE ER 25 MG PO TB24: 25.0000 mg | ORAL_TABLET | Freq: Every day | ORAL | 3 refills | Status: DC

## 2021-08-11 NOTE — Telephone Encounter (Signed)
-----   Message from Early Osmond, MD sent at 07/31/2021 12:31 PM EST ----- Let him know his echo showed that the heart function is down a bit --it may be due to the fact he is having frequent extra beats so it is hard to measure the pumping function.  Given that he is DNR, we will treat conservatively.  Please stop verapamil and start Toprol XL 25mg  qbedtime.  Thanks.

## 2021-08-11 NOTE — Telephone Encounter (Signed)
Spoke with the patient and reviewed the results of echo and recommendations w him.  His follow up was to be 6 months.  He would like to be seen sooner if possible.  He notes over the past year he doesn't have the energy he used to have and wonders if the decreased heart function is the reason.  He will switch to Toprol XL and will return for office visit in Feb 2023.

## 2021-08-11 NOTE — Telephone Encounter (Signed)
Spoke with patient's wife (DPR). Adv of results/recommendations.  She will give the information to the patient when he returns, including to stop verapamil and start Toprol XL 25 mg every day at bedtime.

## 2021-08-11 NOTE — Addendum Note (Signed)
Addended by: Rodman Key on: 08/11/2021 02:05 PM   Modules accepted: Orders

## 2021-08-31 ENCOUNTER — Other Ambulatory Visit: Payer: Self-pay | Admitting: Nurse Practitioner

## 2021-09-15 ENCOUNTER — Telehealth: Payer: Self-pay | Admitting: Internal Medicine

## 2021-09-15 NOTE — Telephone Encounter (Signed)
Left detailed message for patient per DPR.  Advised per Dr. Ali Lowe: Ali Lowe, Arlie Solomons, MD  Stop it for 2 weeks to see if the diarrhea resolves.  If it does not, the diarrhea is not from the medication and I would restart the medication.  If it does, let us know    Advised would forward to Dr. Dara Hoyer nurse for follow up.

## 2021-09-15 NOTE — Telephone Encounter (Signed)
Pt c/o medication issue:  1. Name of Medication: metoprolol succinate (TOPROL XL) 25 MG 24 hr tablet  2. How are you currently taking this medication (dosage and times per day)? Take 1 tablet (25 mg total) by mouth daily.  3. Are you having a reaction (difficulty breathing--STAT)? diarrhea  4. What is your medication issue? Pt is having diarrhea since starting rx.. please advise

## 2021-10-01 ENCOUNTER — Encounter: Payer: Medicare PPO | Admitting: Nurse Practitioner

## 2021-10-02 NOTE — Patient Instructions (Signed)
Eric Chung , Thank you for taking time to come for your Medicare Wellness Visit. I appreciate your ongoing commitment to your health goals. Please review the following plan we discussed and let me know if I can assist you in the future.   Screening recommendations/referrals: Colonoscopy aged out Recommended yearly ophthalmology/optometry visit for glaucoma screening and checkup Recommended yearly dental visit for hygiene and checkup  Vaccinations: Influenza vaccine up to date Pneumococcal vaccine up to date Tdap vaccine up to date Shingles vaccine up to date    Advanced directives: on file.   Conditions/risks identified: advanced age, htn  Next appointment: yearly for awv  Preventive Care 35 Years and Older, Male Preventive care refers to lifestyle choices and visits with your health care provider that can promote health and wellness. What does preventive care include? A yearly physical exam. This is also called an annual well check. Dental exams once or twice a year. Routine eye exams. Ask your health care provider how often you should have your eyes checked. Personal lifestyle choices, including: Daily care of your teeth and gums. Regular physical activity. Eating a healthy diet. Avoiding tobacco and drug use. Limiting alcohol use. Practicing safe sex. Taking low doses of aspirin every day. Taking vitamin and mineral supplements as recommended by your health care provider. What happens during an annual well check? The services and screenings done by your health care provider during your annual well check will depend on your age, overall health, lifestyle risk factors, and family history of disease. Counseling  Your health care provider may ask you questions about your: Alcohol use. Tobacco use. Drug use. Emotional well-being. Home and relationship well-being. Sexual activity. Eating habits. History of falls. Memory and ability to understand (cognition). Work and  work Statistician. Screening  You may have the following tests or measurements: Height, weight, and BMI. Blood pressure. Lipid and cholesterol levels. These may be checked every 5 years, or more frequently if you are over 23 years old. Skin check. Lung cancer screening. You may have this screening every year starting at age 66 if you have a 30-pack-year history of smoking and currently smoke or have quit within the past 15 years. Fecal occult blood test (FOBT) of the stool. You may have this test every year starting at age 25. Flexible sigmoidoscopy or colonoscopy. You may have a sigmoidoscopy every 5 years or a colonoscopy every 10 years starting at age 74. Prostate cancer screening. Recommendations will vary depending on your family history and other risks. Hepatitis C blood test. Hepatitis B blood test. Sexually transmitted disease (STD) testing. Diabetes screening. This is done by checking your blood sugar (glucose) after you have not eaten for a while (fasting). You may have this done every 1-3 years. Abdominal aortic aneurysm (AAA) screening. You may need this if you are a current or former smoker. Osteoporosis. You may be screened starting at age 92 if you are at high risk. Talk with your health care provider about your test results, treatment options, and if necessary, the need for more tests. Vaccines  Your health care provider may recommend certain vaccines, such as: Influenza vaccine. This is recommended every year. Tetanus, diphtheria, and acellular pertussis (Tdap, Td) vaccine. You may need a Td booster every 10 years. Zoster vaccine. You may need this after age 70. Pneumococcal 13-valent conjugate (PCV13) vaccine. One dose is recommended after age 46. Pneumococcal polysaccharide (PPSV23) vaccine. One dose is recommended after age 82. Talk to your health care provider about which screenings  and vaccines you need and how often you need them. This information is not intended to  replace advice given to you by your health care provider. Make sure you discuss any questions you have with your health care provider. Document Released: 09/06/2015 Document Revised: 04/29/2016 Document Reviewed: 06/11/2015 Elsevier Interactive Patient Education  2017 [employer] Prevention in the Home Falls can cause injuries. They can happen to people of all ages. There are many things you can do to make your home safe and to help prevent falls. What can I do on the outside of my home? Regularly fix the edges of walkways and driveways and fix any cracks. Remove anything that might make you trip as you walk through a door, such as a raised step or threshold. Trim any bushes or trees on the path to your home. Use bright outdoor lighting. Clear any walking paths of anything that might make someone trip, such as rocks or tools. Regularly check to see if handrails are loose or broken. Make sure that both sides of any steps have handrails. Any raised decks and porches should have guardrails on the edges. Have any leaves, snow, or ice cleared regularly. Use sand or salt on walking paths during winter. Clean up any spills in your garage right away. This includes oil or grease spills. What can I do in the bathroom? Use night lights. Install grab bars by the toilet and in the tub and shower. Do not use towel bars as grab bars. Use non-skid mats or decals in the tub or shower. If you need to sit down in the shower, use a plastic, non-slip stool. Keep the floor dry. Clean up any water that spills on the floor as soon as it happens. Remove soap buildup in the tub or shower regularly. Attach bath mats securely with double-sided non-slip rug tape. Do not have throw rugs and other things on the floor that can make you trip. What can I do in the bedroom? Use night lights. Make sure that you have a light by your bed that is easy to reach. Do not use any sheets or blankets that are too big for  your bed. They should not hang down onto the floor. Have a firm chair that has side arms. You can use this for support while you get dressed. Do not have throw rugs and other things on the floor that can make you trip. What can I do in the kitchen? Clean up any spills right away. Avoid walking on wet floors. Keep items that you use a lot in easy-to-reach places. If you need to reach something above you, use a strong step stool that has a grab bar. Keep electrical cords out of the way. Do not use floor polish or wax that makes floors slippery. If you must use wax, use non-skid floor wax. Do not have throw rugs and other things on the floor that can make you trip. What can I do with my stairs? Do not leave any items on the stairs. Make sure that there are handrails on both sides of the stairs and use them. Fix handrails that are broken or loose. Make sure that handrails are as long as the stairways. Check any carpeting to make sure that it is firmly attached to the stairs. Fix any carpet that is loose or worn. Avoid having throw rugs at the top or bottom of the stairs. If you do have throw rugs, attach them to the floor with carpet tape.  Make sure that you have a light switch at the top of the stairs and the bottom of the stairs. If you do not have them, ask someone to add them for you. What else can I do to help prevent falls? Wear shoes that: Do not have high heels. Have rubber bottoms. Are comfortable and fit you well. Are closed at the toe. Do not wear sandals. If you use a stepladder: Make sure that it is fully opened. Do not climb a closed stepladder. Make sure that both sides of the stepladder are locked into place. Ask someone to hold it for you, if possible. Clearly mark and make sure that you can see: Any grab bars or handrails. First and last steps. Where the edge of each step is. Use tools that help you move around (mobility aids) if they are needed. These  include: Canes. Walkers. Scooters. Crutches. Turn on the lights when you go into a dark area. Replace any light bulbs as soon as they burn out. Set up your furniture so you have a clear path. Avoid moving your furniture around. If any of your floors are uneven, fix them. If there are any pets around you, be aware of where they are. Review your medicines with your doctor. Some medicines can make you feel dizzy. This can increase your chance of falling. Ask your doctor what other things that you can do to help prevent falls. This information is not intended to replace advice given to you by your health care provider. Make sure you discuss any questions you have with your health care provider. Document Released: 06/06/2009 Document Revised: 01/16/2016 Document Reviewed: 09/14/2014 Elsevier Interactive Patient Education  2017 [employer].

## 2021-10-02 NOTE — Progress Notes (Deleted)
Subjective:   Eric Chung is a 87 y.o. male who presents for Medicare Annual/Subsequent preventive examination.  Review of Systems           Objective:    There were no vitals filed for this visit. There is no height or weight on file to calculate BMI.  Advanced Directives 07/03/2021 06/26/2021 03/17/2019 02/02/2019 07/11/2018 08/26/2017 07/12/2017  Does Patient Have a Medical Advance Directive? Yes Yes No;Yes Yes Yes Yes Yes  Type of Advance Directive Living will Living will Living will Living will [place];Living will - [place];Living will  Does patient want to make changes to medical advance directive? No - Patient declined No - Patient declined No - Patient declined No - Patient declined No - Patient declined No - Patient declined No - Patient declined  Copy of [place] in Chart? - - - - Yes - validated most recent copy scanned in chart (See row information) - -  Would patient like information on creating a medical advance directive? - - No - Patient declined - - - -    Current Medications (verified) Outpatient Encounter Medications as of 10/03/2021  Medication Sig   allopurinol (ZYLOPRIM) 100 MG tablet TAKE ONE TABLET BY MOUTH TWICE DAILY   ELIQUIS 5 MG TABS tablet Take 2.5 mg by mouth 2 (two) times a day.    fluticasone (FLONASE) 50 MCG/ACT nasal spray Place 1 spray into both nostrils daily.    ibuprofen (ADVIL) 200 MG tablet Take 200 mg by mouth as needed.   latanoprost (XALATAN) 0.005 % ophthalmic solution Place 1 drop into both eyes at bedtime.   metoprolol succinate (TOPROL XL) 25 MG 24 hr tablet Take 1 tablet (25 mg total) by mouth daily.   Multiple Vitamins-Minerals (MULTIVITAMIN WITH MINERALS) tablet Take 1 tablet by mouth daily.   omeprazole (PRILOSEC) 20 MG capsule TAKE ONE CAPSULE BY MOUTH ONCE DAILY.   SUMAtriptan (IMITREX) 50 MG tablet Take 1 tablet (50 mg total) by mouth as needed.   No  facility-administered encounter medications on file as of 10/03/2021.    Allergies (verified) Penicillins   History: Past Medical History:  Diagnosis Date   Apnea, sleep    Barrett's esophagus    Gout    Hypertension    Obesity    Past Surgical History:  Procedure Laterality Date   CHOLECYSTECTOMY  08/29/2017   EYE SURGERY Bilateral 2011-2012   catracts removed   HERNIA REPAIR  2005   T. Carlton Adam MD   TONSILECTOMY/ADENOIDECTOMY WITH MYRINGOTOMY  37   Family History  Problem Relation Age of Onset   Osteoporosis Mother    Cancer Father 75       esophagus   Crohn's disease Sister    Birth defects Maternal Grandmother        colon   Diabetes Paternal Grandmother    Social History   Socioeconomic History   Marital status: Married    Spouse name: Inez Catalina   Number of children: 1   Years of education: Not on file   Highest education level: Not on file  Occupational History   Not on file  Tobacco Use   Smoking status: Former    Packs/day: 1.00    Years: 15.00    Pack years: 15.00    Types: Cigarettes    Quit date: 08/24/1984    Years since quitting: 37.1   Smokeless tobacco: Never  Vaping Use   Vaping Use: Never used  Substance  and Sexual Activity   Alcohol use: Yes    Alcohol/week: 3.0 - 4.0 standard drinks    Types: 3 - 4 Standard drinks or equivalent per week    Comment: 2-3 beers a month   Drug use: No   Sexual activity: Not Currently  Other Topics Concern   Not on file  Social History Narrative   Social History     Marital status: Married ,1986            Spouse name: Inez Catalina                    Years of education:                 Number of children: 1                Occupational History: Pharmacist, hospital     None on file      Social History Main Topics     Smoking status:                       Smokeless tobacco: Not on file                        Alcohol use: 3-4 drinks per week     Drug use: Unknown     Sexual activity: Not on file            Other Topics             Concern     None on file      Social History Narrative     None on file            Social Determinants of Health   Financial Resource Strain: Not on file  Food Insecurity: Not on file  Transportation Needs: Not on file  Physical Activity: Not on file  Stress: Not on file  Social Connections: Not on file    Tobacco Counseling Counseling given: Not Answered   Clinical Intake:                 Diabetic?no         Activities of Daily Living No flowsheet data found.  Patient Care Team: Mast, Man X, NP as PCP - General (Internal Medicine) Early Osmond, MD as PCP - Cardiology (Cardiology)  Indicate any recent Medical Services you may have received from other than [facility] providers in the past year (date may be approximate).     Assessment:   This is a routine wellness examination for [facility].  Hearing/Vision screen No results found.  Dietary issues and exercise activities discussed:     Goals Addressed   None    Depression Screen PHQ 2/9 Scores 07/03/2021 10/01/2020 07/11/2018 05/27/2017  PHQ - 2 Score 0 0 0 0    Fall Risk Fall Risk  07/03/2021 10/01/2020 09/26/2020 05/24/2020 02/29/2020  Falls in the past year? 0 0 0 0 0  Number falls in past yr: 0 0 0 0 0  Comment - - - - -  Injury with Fall? 0 0 - - -  Comment - - - - -  Risk for fall due to : No Fall Risks - - - -  Follow up Falls evaluation completed - - - -    FALL RISK PREVENTION PERTAINING TO THE HOME:  Any stairs in or around the home? {YES/NO:21197} If so, are there any without handrails? {  YES/NO:21197} Home free of loose throw rugs in walkways, pet beds, electrical cords, etc? {YES/NO:21197} Adequate lighting in your home to reduce risk of falls? {YES/NO:21197}  ASSISTIVE DEVICES UTILIZED TO PREVENT FALLS:  Life alert? {YES/NO:21197} Use of a cane, walker or w/c? {YES/NO:21197} Grab bars in the bathroom? {YES/NO:21197} Shower chair or bench in shower?  {YES/NO:21197} Elevated toilet seat or a handicapped toilet? {YES/NO:21197}  TIMED UP AND GO:  Was the test performed? No .    Cognitive Function: MMSE - Mini Mental State Exam 07/11/2018 05/27/2017  Orientation to time 5 4  Orientation to Place 5 5  Registration 3 3  Attention/ Calculation 5 5  Recall 3 3  Language- name 2 objects 2 2  Language- repeat 1 1  Language- follow 3 step command 3 2  Language- read & follow direction 1 1  Write a sentence 1 1  Copy design 1 1  Total score 30 28     6CIT Screen 10/01/2020  What Year? 0 points  What month? 0 points  What time? 0 points  Count back from 20 0 points  Months in reverse 0 points  Repeat phrase 0 points  Total Score 0    Immunizations Immunization History  Administered Date(s) Administered   Fluad Quad(high Dose 65+) 06/07/2019, 06/09/2021   Influenza Whole 05/26/2018   Influenza, High Dose Seasonal PF 06/02/2017, 06/05/2020   Influenza-Unspecified 06/24/2016   Moderna Sars-Covid-2 Vaccination 08/28/2019, 09/25/2019, 07/02/2020   Pneumococcal Conjugate-13 09/12/2014   Pneumococcal Polysaccharide-23 12/17/2016   Pneumococcal-Unspecified 12/17/2016   Td 07/31/2016   Zoster Recombinat (Shingrix) 08/24/2005, 04/18/2019, 07/04/2019    TDAP status: Up to date  Flu Vaccine status: Up to date  Pneumococcal vaccine status: Up to date  Covid-19 vaccine status: Information provided on how to obtain vaccines.   Qualifies for Shingles Vaccine? Yes   Zostavax completed No   Shingrix Completed?: Yes  Screening Tests Health Maintenance  Topic Date Due   COVID-19 Vaccine (4 - Booster for Moderna series) 08/27/2020   TETANUS/TDAP  07/31/2026   Pneumonia Vaccine 15+ Years old  Completed   INFLUENZA VACCINE  Completed   Zoster Vaccines- Shingrix  Completed   HPV VACCINES  Aged Out    Health Maintenance  Health Maintenance Due  Topic Date Due   COVID-19 Vaccine (4 - Booster for Moderna series) 08/27/2020     Colorectal cancer screening: No longer required.   Lung Cancer Screening: (Low Dose CT Chest recommended if Age 64-80 years, 30 pack-year currently smoking OR have quit w/in 15years.) does not qualify.   Lung Cancer Screening Referral: na  Additional Screening:  Hepatitis C Screening: does not qualify; Completed na  Vision Screening: Recommended annual ophthalmology exams for early detection of glaucoma and other disorders of the eye. Is the patient up to date with their annual eye exam?  Yes  Who is the provider or what is the name of the office in which the patient attends annual eye exams? [facility] If pt is not established with a provider, would they like to be referred to a provider to establish care? No .   Dental Screening: Recommended annual dental exams for proper oral hygiene  Community Resource Referral / Chronic Care Management: CRR required this visit?  No   CCM required this visit?  No      Plan:     I have personally reviewed and noted the following in the patients chart:   Medical and social history Use of alcohol, tobacco or  illicit drugs  Current medications and supplements including opioid prescriptions. Patient is not currently taking opioid prescriptions. Functional ability and status Nutritional status Physical activity Advanced directives List of other physicians Hospitalizations, surgeries, and ER visits in previous 12 months Vitals Screenings to include cognitive, depression, and falls Referrals and appointments  In addition, I have reviewed and discussed with patient certain preventive protocols, quality metrics, and best practice recommendations. A written personalized care plan for preventive services as well as general preventive health recommendations were provided to patient.     Lauree Chandler, NP   10/02/2021    Virtual Visit via Telephone Note  I connected with patient 10/02/21 at 11:30 AM EST by telephone and verified that I am  speaking with the correct person using two identifiers.  Location: Patient: home Provider: [place]   I discussed the limitations, risks, security and privacy concerns of performing an evaluation and management service by telephone and the availability of in person appointments. I also discussed with the patient that there may be a patient responsible charge related to this service. The patient expressed understanding and agreed to proceed.   I discussed the assessment and treatment plan with the patient. The patient was provided an opportunity to ask questions and all were answered. The patient agreed with the plan and demonstrated an understanding of the instructions.   The patient was advised to call back or seek an in-person evaluation if the symptoms worsen or if the condition fails to improve as anticipated.  I provided 15 minutes of non-face-to-face time during this encounter.  [employer]. Harle [employer] printed and mailed

## 2021-10-03 ENCOUNTER — Encounter: Payer: Medicare PPO | Admitting: Nurse Practitioner

## 2021-10-03 ENCOUNTER — Telehealth: Payer: Self-pay

## 2021-10-03 ENCOUNTER — Other Ambulatory Visit: Payer: Self-pay

## 2021-10-03 DIAGNOSIS — Z0000 Encounter for general adult medical examination without abnormal findings: Secondary | ICD-10-CM

## 2021-10-03 NOTE — Telephone Encounter (Signed)
I made 3 unsuccessful attempts to reach patient to start AWV. Patient voicemail was full and mobile number not in service.  1st attempt-11:38 am 2nd Attempt-11:44 am 3rd attempt-11:54 am

## 2021-10-04 NOTE — Progress Notes (Signed)
Cardiology Office Note:    Date:  10/06/2021   ID:  Eric Chung, DOB September 19, 1934, MRN [id number]  PCP:  Mast, Man X, NP   [facility] Providers Cardiologist:  Lenna Sciara, MD Referring MD: Mast, Man X, NP   Chief Complaint/Reason for Referral:  Hospital follow up  ASSESSMENT:    Cardiomyopathy, unspecified type ([name])  VTE (venous thromboembolism)  Primary hypertension  Mixed hyperlipidemia    PLAN:    In order of problems listed above:  1.  The patient is not interested in invasive assessments.  We will forego cardiac catheterization.  Given his bradycardia will decrease Toprol to 12.5 mg.  We will start losartan 25 mg and Jardiance 10 mg.  We will obtain an echocardiogram in 3 months time.  Follow-up in 6 months or earlier if needed.  No cardiac catheterization  2.  Continue indefinite anticoagulation.  3.  See discussion above.  4.  Being managed by the patient's primary care provider.             Dispo:  No follow-ups on file.     Medication Adjustments/Labs and Tests Ordered: Current medicines are reviewed at length with the patient today.  Concerns regarding medicines are outlined above.   Tests Ordered: No orders of the defined types were placed in this encounter.   Medication Changes: No orders of the defined types were placed in this encounter.   History of Present Illness:    FOCUSED PROBLEM LIST:   1.  DNR 2.  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  Gout 4.  GERD and Barrett's esophagus 5.  Hypertension 6.  Hyperlipidemia 7.  OSA not on CPAP or BIPAP 8.  Bifascicular block 9.  Cardiomyopathy with ejection fraction of 30 to 35% on echocardiogram December 2022 the study was marred by frequent PVCs   The patient is a 87 y.o. male with the indicated medical history here for follow-up.  When I saw him last I referred him for an echocardiogram which demonstrated frequent ectopy and a difficult to  assess ejection fraction of 30 to 35%.  His verapamil was stopped and he was started on Toprol-XL 25 mg.  He feels relatively well.  He denies any cardiovascular issues.  He has required no hospitalizations or emergency room visits.  He is otherwise well and without complaints.        Previous Medical History: Past Medical History:  Diagnosis Date   Apnea, sleep    Barrett's esophagus    Gout    Hypertension    Obesity      Current Medications: Current Meds  Medication Sig   allopurinol (ZYLOPRIM) 100 MG tablet TAKE ONE TABLET BY MOUTH TWICE DAILY   apixaban (ELIQUIS) 2.5 MG TABS tablet Take 2.5 mg by mouth 2 (two) times a day.    fluticasone (FLONASE) 50 MCG/ACT nasal spray Place 1 spray into both nostrils daily.    ibuprofen (ADVIL) 200 MG tablet Take 200 mg by mouth as needed.   latanoprost (XALATAN) 0.005 % ophthalmic solution Place 1 drop into both eyes at bedtime.   metoprolol succinate (TOPROL XL) 25 MG 24 hr tablet Take 1 tablet (25 mg total) by mouth daily.   Multiple Vitamins-Minerals (MULTIVITAMIN WITH MINERALS) tablet Take 1 tablet by mouth daily.   omeprazole (PRILOSEC) 20 MG capsule TAKE ONE CAPSULE BY MOUTH ONCE DAILY.   SUMAtriptan (IMITREX) 50 MG tablet Take 1 tablet (50 mg total) by mouth as needed.  Allergies:    Penicillins   Social History:   Social History   Tobacco Use   Smoking status: Former    Packs/day: 1.00    Years: 15.00    Pack years: 15.00    Types: Cigarettes    Quit date: 08/24/1984    Years since quitting: 37.1   Smokeless tobacco: Never  Vaping Use   Vaping Use: Never used  Substance Use Topics   Alcohol use: Yes    Alcohol/week: 3.0 - 4.0 standard drinks    Types: 3 - 4 Standard drinks or equivalent per week    Comment: 2-3 beers a month   Drug use: No     Family Hx: Family History  Problem Relation Age of Onset   Osteoporosis Mother    Cancer Father 2       esophagus   Crohn's disease Sister    Birth defects Maternal  Grandmother        colon   Diabetes Paternal Grandmother      Review of Systems:   Please see the history of present illness.    All other systems reviewed and are negative.     EKGs/Labs/Other Test Reviewed:    EKG:  None today  Prior CV studies:  TTE 12/22 1. Very frequent PVCs during the study.   2. Left ventricular ejection fraction, by estimation, is 30 to 35%. The  left ventricle has moderately decreased function. Difficult to assess wall  motion due to very frequent PVCs with septal-lateral dyssynchrony.  Overall, appears to have global  hypokinesis. The left ventricular internal cavity size was mildly dilated.  There is mild concentric left ventricular hypertrophy. Left ventricular  diastolic parameters are consistent with Grade I diastolic dysfunction  (impaired relaxation).   3. Right ventricular systolic function is normal. The right ventricular  size is normal.   4. Left atrial size was mildly dilated.   5. The mitral valve is grossly normal. Mild mitral valve regurgitation.   6. The aortic valve is tricuspid. There is mild calcification of the  aortic valve. There is mild thickening of the aortic valve. Aortic valve  regurgitation is mild. Aortic valve sclerosis/calcification is present,  without any evidence of aortic  stenosis.   7. Aortic dilatation noted. There is mild dilatation of the aortic root,  measuring 40 mm. There is mild dilatation of the ascending aorta,  measuring 37 mm.   Imaging studies that I have independently reviewed today: TTE  Recent Labs: No results found for requested labs within last 8760 hours.   Recent Lipid Panel Lab Results  Component Value Date/Time   CHOL 223 (H) 10/05/2019 07:00 AM   TRIG 153 (H) 10/05/2019 07:00 AM   HDL 43 10/05/2019 07:00 AM   LDLCALC 152 (H) 10/05/2019 07:00 AM    Risk Assessment/Calculations:          Physical Exam:    VS:  BP 130/70 (BP Location: Left Arm, Patient Position: Sitting, Cuff  Size: Normal)    Pulse (!) 44    Ht 5\' 11"  (1.803 m)    Wt (!) 335 lb (152 kg)    SpO2 94%    BMI 46.72 kg/m    Wt Readings from Last 3 Encounters:  10/06/21 (!) 335 lb (152 kg)  07/07/21 236 lb 9.6 oz (107.3 kg)  07/03/21 235 lb (106.6 kg)    GENERAL:  No apparent distress, AOx3 HEENT:  No carotid bruits, +2 carotid impulses, no scleral icterus CAR: RRR with  occasional ectopry no murmurs, gallops, rubs, or thrills RES:  Clear to auscultation bilaterally ABD:  Soft, nontender, nondistended, positive bowel sounds x 4 VASC:  +2 radial pulses, +2 carotid pulses, palpable pedal pulses NEURO:  CN 2-12 grossly intact; motor and sensory grossly intact PSYCH:  No active depression or anxiety EXT:  No edema, ecchymosis, or cyanosis  Signed, Early Osmond, MD  10/06/2021 3:08 PM    [facility] [place], [place], [place]  [address] Phone: [phone]; Fax: [phone]   Note:  This document was prepared using Dragon voice recognition software and may include unintentional dictation errors.

## 2021-10-06 ENCOUNTER — Ambulatory Visit: Payer: Medicare PPO | Admitting: Internal Medicine

## 2021-10-06 ENCOUNTER — Other Ambulatory Visit: Payer: Self-pay

## 2021-10-06 ENCOUNTER — Encounter: Payer: Self-pay | Admitting: Internal Medicine

## 2021-10-06 VITALS — BP 130/70 | HR 44 | Ht 71.0 in | Wt 335.0 lb

## 2021-10-06 DIAGNOSIS — E782 Mixed hyperlipidemia: Secondary | ICD-10-CM | POA: Diagnosis not present

## 2021-10-06 DIAGNOSIS — I10 Essential (primary) hypertension: Secondary | ICD-10-CM | POA: Diagnosis not present

## 2021-10-06 DIAGNOSIS — I8290 Acute embolism and thrombosis of unspecified vein: Secondary | ICD-10-CM | POA: Diagnosis not present

## 2021-10-06 DIAGNOSIS — I429 Cardiomyopathy, unspecified: Secondary | ICD-10-CM

## 2021-10-06 MED ORDER — METOPROLOL SUCCINATE ER 25 MG PO TB24: 12.5000 mg | ORAL_TABLET | Freq: Every day | ORAL | 3 refills | Status: DC

## 2021-10-06 MED ORDER — LOSARTAN POTASSIUM 25 MG PO TABS: 25.0000 mg | ORAL_TABLET | Freq: Every day | ORAL | 3 refills | Status: DC

## 2021-10-06 MED ORDER — EMPAGLIFLOZIN 10 MG PO TABS: 10.0000 mg | ORAL_TABLET | Freq: Every day | ORAL | 11 refills | Status: DC

## 2021-10-06 NOTE — Patient Instructions (Signed)
Medication Instructions:  Your physician has recommended you make the following change in your medication: Decrease Toprol to 12.5 mg by mouth daily. Start Losartan 25 mg by mouth daily. Start Jardiance 10 mg by mouth daily  *If you need a refill on your cardiac medications before your next appointment, please call your pharmacy*   Lab Work: Your physician recommends that you return for lab work in: one week--BMP.  This is not fasting  If you have labs (blood work) drawn today and your tests are completely normal, you will receive your results only by: [place] (if you have MyChart) OR A paper copy in the mail If you have any lab test that is abnormal or we need to change your treatment, we will call you to review the results.   Testing/Procedures: Your physician has requested that you have an echocardiogram. Echocardiography is a painless test that uses sound waves to create images of your heart. It provides your doctor with information about the size and shape of your heart and how well your hearts chambers and valves are working. This procedure takes approximately one hour. There are no restrictions for this procedure. To be done in 3 months.    Follow-Up: At [facility], you and your health needs are our priority.  As part of our continuing mission to provide you with exceptional heart care, we have created designated Provider Care Teams.  These Care Teams include your primary Cardiologist (physician) and Advanced Practice Providers (APPs -  Physician Assistants and Nurse Practitioners) who all work together to provide you with the care you need, when you need it.  We recommend signing up for the patient portal called "MyChart".  Sign up information is provided on this After Visit Summary.  MyChart is used to connect with patients for Virtual Visits (Telemedicine).  Patients are able to view lab/test results, encounter notes, upcoming appointments, etc.  Non-urgent messages  can be sent to your provider as well.   To learn more about what you can do with MyChart, go to [email or web address].    Your next appointment:   6 month(s)  The format for your next appointment:   In Person  Provider:   Early Osmond, MD     Other Instructions

## 2021-10-06 NOTE — Telephone Encounter (Signed)
Pt has appointment with Dr. Ali Lowe today.

## 2021-10-13 ENCOUNTER — Other Ambulatory Visit: Payer: Self-pay

## 2021-10-13 ENCOUNTER — Other Ambulatory Visit: Payer: Medicare PPO | Admitting: *Deleted

## 2021-10-13 DIAGNOSIS — I429 Cardiomyopathy, unspecified: Secondary | ICD-10-CM

## 2021-10-13 DIAGNOSIS — I10 Essential (primary) hypertension: Secondary | ICD-10-CM | POA: Diagnosis not present

## 2021-10-13 LAB — BASIC METABOLIC PANEL WITH GFR
BUN/Creatinine Ratio: 14 (ref 10–24)
BUN: 16 mg/dL (ref 8–27)
CO2: 23 mmol/L (ref 20–29)
Calcium: 8.8 mg/dL (ref 8.6–10.2)
Chloride: 105 mmol/L (ref 96–106)
Creatinine, Ser: 1.11 mg/dL (ref 0.76–1.27)
Glucose: 121 mg/dL — ABNORMAL HIGH (ref 70–99)
Potassium: 4.1 mmol/L (ref 3.5–5.2)
Sodium: 140 mmol/L (ref 134–144)
eGFR: 65 mL/min/1.73

## 2021-10-22 ENCOUNTER — Encounter: Payer: Self-pay | Admitting: *Deleted

## 2021-11-01 ENCOUNTER — Other Ambulatory Visit: Payer: Self-pay | Admitting: Nurse Practitioner

## 2021-12-24 ENCOUNTER — Other Ambulatory Visit: Payer: Self-pay

## 2021-12-24 MED ORDER — EMPAGLIFLOZIN 10 MG PO TABS: 10.0000 mg | ORAL_TABLET | Freq: Every day | ORAL | 3 refills | Status: DC

## 2021-12-24 NOTE — Addendum Note (Signed)
Addended by: Carter Kitten D on: 12/24/2021 02:53 PM ? ? Modules accepted: Orders ? ?

## 2021-12-29 DIAGNOSIS — I2782 Chronic pulmonary embolism: Secondary | ICD-10-CM | POA: Diagnosis not present

## 2021-12-29 DIAGNOSIS — I82512 Chronic embolism and thrombosis of left femoral vein: Secondary | ICD-10-CM | POA: Diagnosis not present

## 2021-12-29 DIAGNOSIS — Z6832 Body mass index (BMI) 32.0-32.9, adult: Secondary | ICD-10-CM | POA: Diagnosis not present

## 2021-12-29 DIAGNOSIS — E669 Obesity, unspecified: Secondary | ICD-10-CM | POA: Diagnosis not present

## 2021-12-29 DIAGNOSIS — Z7901 Long term (current) use of anticoagulants: Secondary | ICD-10-CM | POA: Diagnosis not present

## 2022-01-05 ENCOUNTER — Ambulatory Visit (HOSPITAL_COMMUNITY): Payer: Medicare PPO | Attending: Cardiovascular Disease

## 2022-01-05 DIAGNOSIS — I429 Cardiomyopathy, unspecified: Secondary | ICD-10-CM

## 2022-01-05 LAB — ECHOCARDIOGRAM COMPLETE
Area-P 1/2: 3.09 cm2
P 1/2 time: 683 msec
S' Lateral: 4.5 cm

## 2022-01-06 IMAGING — CR DG CHEST 2V
2 series · 2 of 2 positions shown · non-contrast
Comparison: August 26, 2017

CLINICAL DATA: Cough and congestion

EXAM:
CHEST - 2 VIEW

[w chest pa]
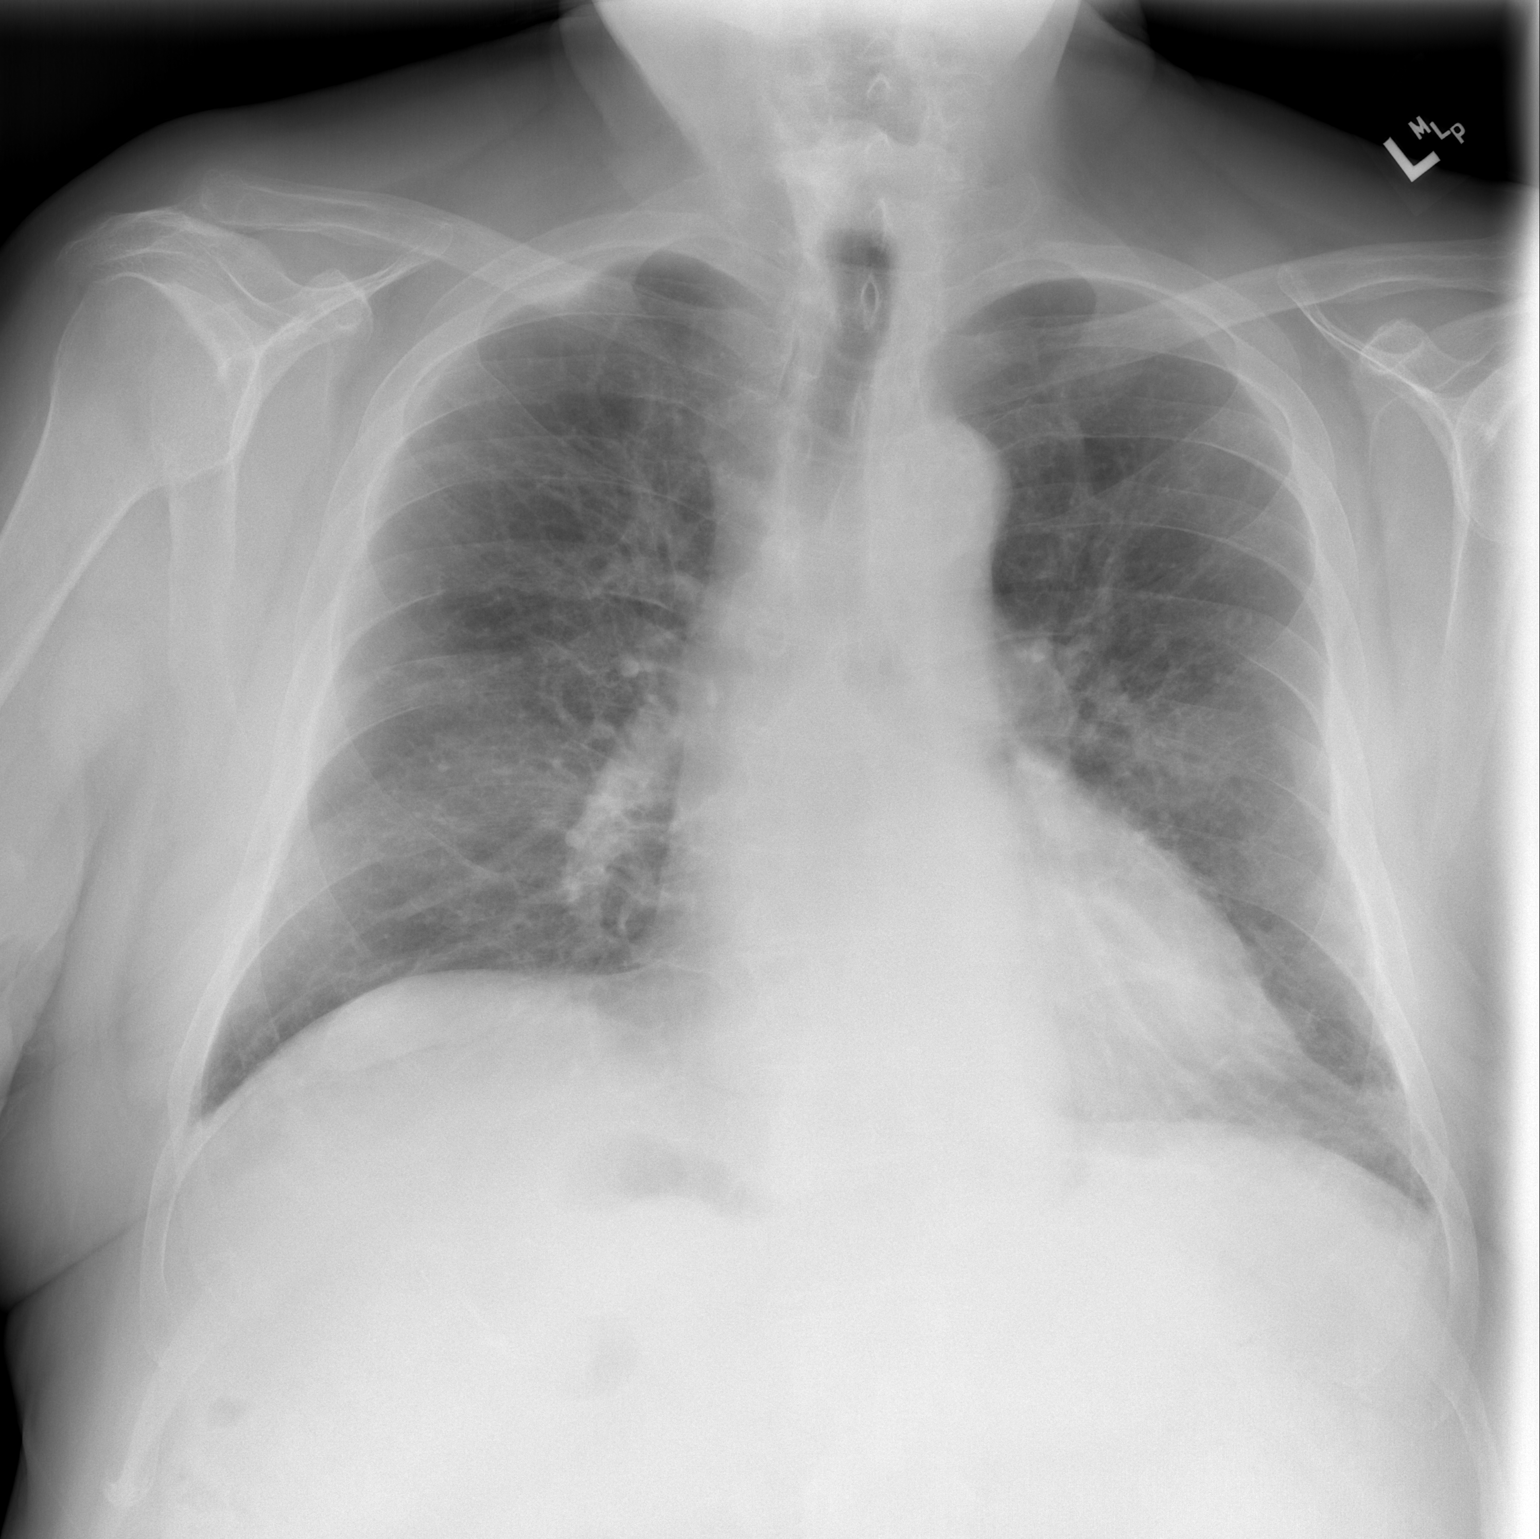

[w chest lat]
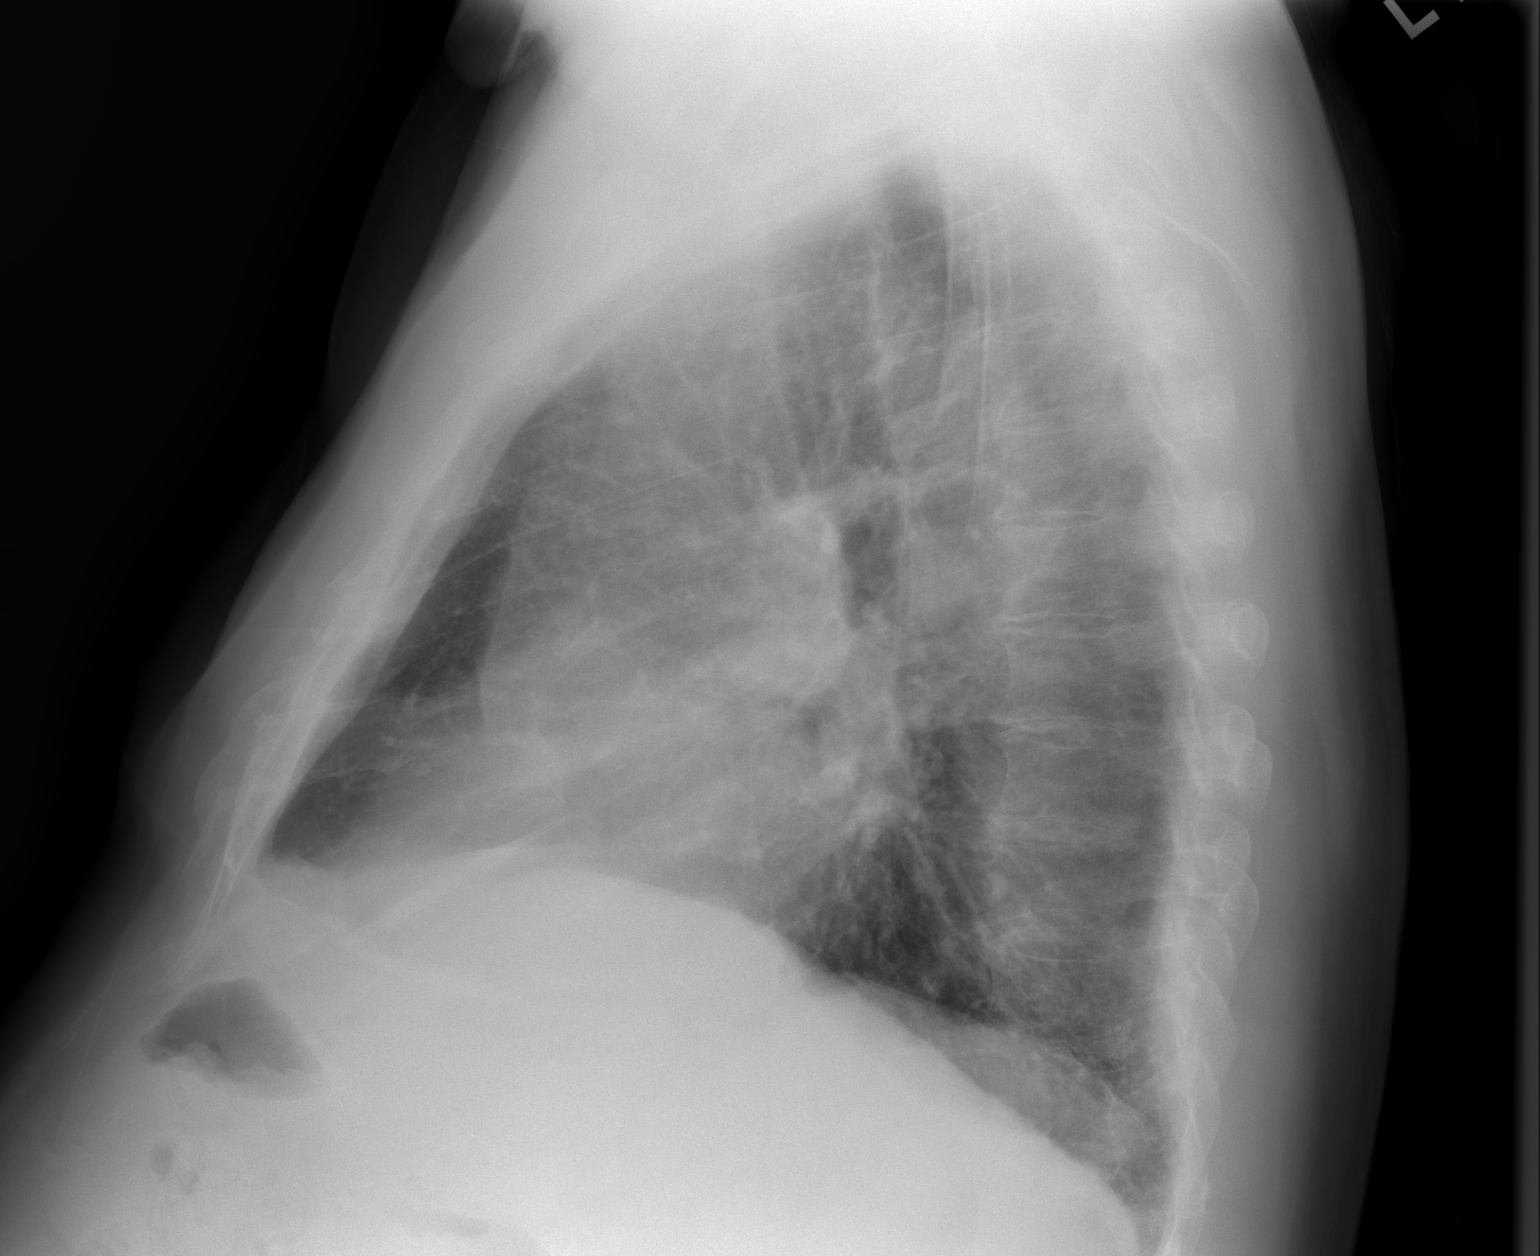

[2 of 2 positions shown; findings below may reference images not displayed]

FINDINGS: There is mild scarring in the left base. The lungs otherwise are
clear. Heart is upper normal in size with pulmonary vascularity
normal. No adenopathy. There is mild degenerative change in the
thoracic spine.
IMPRESSION: Mild scarring left base. Lungs otherwise clear. Heart upper normal
in size. No adenopathy.

## 2022-01-08 ENCOUNTER — Encounter: Payer: Medicare PPO | Admitting: Nurse Practitioner

## 2022-01-12 ENCOUNTER — Other Ambulatory Visit: Payer: Self-pay | Admitting: Nurse Practitioner

## 2022-01-14 ENCOUNTER — Encounter: Payer: Medicare PPO | Admitting: Family Medicine

## 2022-03-25 ENCOUNTER — Encounter: Payer: Self-pay | Admitting: Nurse Practitioner

## 2022-03-26 ENCOUNTER — Non-Acute Institutional Stay: Payer: Medicare PPO | Admitting: Nurse Practitioner

## 2022-03-26 ENCOUNTER — Encounter: Payer: Self-pay | Admitting: Nurse Practitioner

## 2022-03-26 DIAGNOSIS — J309 Allergic rhinitis, unspecified: Secondary | ICD-10-CM

## 2022-03-26 DIAGNOSIS — I8290 Acute embolism and thrombosis of unspecified vein: Secondary | ICD-10-CM

## 2022-03-26 DIAGNOSIS — G43109 Migraine with aura, not intractable, without status migrainosus: Secondary | ICD-10-CM

## 2022-03-26 DIAGNOSIS — I10 Essential (primary) hypertension: Secondary | ICD-10-CM

## 2022-03-26 DIAGNOSIS — E782 Mixed hyperlipidemia: Secondary | ICD-10-CM

## 2022-03-26 DIAGNOSIS — I493 Ventricular premature depolarization: Secondary | ICD-10-CM | POA: Diagnosis not present

## 2022-03-26 DIAGNOSIS — R7303 Prediabetes: Secondary | ICD-10-CM

## 2022-03-26 DIAGNOSIS — M10071 Idiopathic gout, right ankle and foot: Secondary | ICD-10-CM | POA: Diagnosis not present

## 2022-03-26 DIAGNOSIS — G4730 Sleep apnea, unspecified: Secondary | ICD-10-CM

## 2022-03-26 DIAGNOSIS — L719 Rosacea, unspecified: Secondary | ICD-10-CM

## 2022-03-26 DIAGNOSIS — K2270 Barrett's esophagus without dysplasia: Secondary | ICD-10-CM

## 2022-03-26 NOTE — Assessment & Plan Note (Signed)
uses prn Imitrex.  

## 2022-03-26 NOTE — Assessment & Plan Note (Signed)
takes Flonase qd 

## 2022-03-26 NOTE — Assessment & Plan Note (Signed)
No flare ups, takes Allopurinol '100mg'$  qd, R ankle. Uric acid 4.6 10/06/19

## 2022-03-26 NOTE — Progress Notes (Signed)
Location:   [place] Room Number: [place] Place of Service:  Clinic (12) Provider: Marlana Latus NP  Code Status: DNR Goals of Care: [place]    03/25/2022    3:39 PM  Advanced Directives  Does Patient Have a Medical Advance Directive? Yes  Type of Paramedic of [place];Living will  Does patient want to make changes to medical advance directive? No - Patient declined  Copy of [place] in Chart? Yes - validated most recent copy scanned in chart (See row information)     Chief Complaint  Patient presents with   Medical Management of Chronic Issues    Patient is here for a follow up for chronic conditions     HPI: Patient is a 87 y.o. male seen today for medical management of chronic diseases.    Cardiomyopathy, follows cardiology, takes Jardiance, Metoprolol, no cardiac cath Gout, takes Allopurinol '100mg'$  qd, R ankle. Uric acid 4.6 10/06/19             Hx of PE/DVT(L leg), takes Eliquis 2.'5mg'$  bid,              Allergic rhinitis, takes Flonase qd             GERD, Barrett"s,  takes Omeprazole, declined GI f/u. Hgb 15.1 12/29/21             HTN, takes Losartan, Metoprolol, Bun/creat 16/1.11 10/13/21             OSA, not using BiPAP             Migraine with Aura, uses prn Imitrex.              Hyperlipidemia, not taking statin.              Prediabetes, takes Jardiance  Past Medical History:  Diagnosis Date   Apnea, sleep    Barrett's esophagus    Gout    Hypertension    Obesity     Past Surgical History:  Procedure Laterality Date   CHOLECYSTECTOMY  08/29/2017   EYE SURGERY Bilateral 2011-2012   catracts removed   HERNIA REPAIR  2005   T. Carlton Adam MD   TONSILECTOMY/ADENOIDECTOMY WITH MYRINGOTOMY  [phone]    Allergies  Allergen Reactions   Penicillins     Has patient had a PCN reaction causing immediate rash, facial/tongue/throat swelling, SOB or lightheadedness with hypotension: yes, rash Has patient had a PCN  reaction causing severe rash involving mucus membranes or skin necrosis: no Has patient had a PCN reaction that required hospitalization: no Has patient had a PCN reaction occurring within the last 10 years: No If all of the above answers are "NO", then may proceed with Cephalosporin use.     Allergies as of 03/26/2022       Reactions   Penicillins    Has patient had a PCN reaction causing immediate rash, facial/tongue/throat swelling, SOB or lightheadedness with hypotension: yes, rash Has patient had a PCN reaction causing severe rash involving mucus membranes or skin necrosis: no Has patient had a PCN reaction that required hospitalization: no Has patient had a PCN reaction occurring within the last 10 years: No If all of the above answers are "NO", then may proceed with Cephalosporin use.        Medication List        Accurate as of March 26, 2022 11:59 PM. If you have any questions, ask your nurse or doctor.  allopurinol 100 MG tablet Commonly known as: ZYLOPRIM TAKE ONE TABLET BY MOUTH TWICE DAILY   apixaban 2.5 MG Tabs tablet Commonly known as: ELIQUIS Take 2.5 mg by mouth 2 (two) times a day.   empagliflozin 10 MG Tabs tablet Commonly known as: Jardiance Take 1 tablet (10 mg total) by mouth daily before breakfast.   fluticasone 50 MCG/ACT nasal spray Commonly known as: FLONASE Place 1 spray into both nostrils daily.   ibuprofen 200 MG tablet Commonly known as: ADVIL Take 200 mg by mouth as needed.   latanoprost 0.005 % ophthalmic solution Commonly known as: XALATAN Place 1 drop into both eyes at bedtime.   losartan 25 MG tablet Commonly known as: COZAAR Take 1 tablet (25 mg total) by mouth daily.   metoprolol succinate 25 MG 24 hr tablet Commonly known as: Toprol XL Take 0.5 tablets (12.5 mg total) by mouth daily.   metroNIDAZOLE 1 % gel Commonly known as: METROGEL Apply topically 2 (two) times daily for 14 days. Started by: Teosha Casso X Richetta Cubillos,  NP   multivitamin with minerals tablet Take 1 tablet by mouth daily.   omeprazole 20 MG capsule Commonly known as: PRILOSEC TAKE ONE CAPSULE BY MOUTH ONCE DAILY.   SUMAtriptan 50 MG tablet Commonly known as: IMITREX TAKE 1 TABLET AS NEEDED FOR MIGRAINE.        Review of Systems:  Review of Systems  Constitutional:  Negative for activity change, appetite change and fever.       #8Ibs weight loss in the past 8 months.   HENT:  Positive for hearing loss. Negative for congestion, rhinorrhea and voice change.   Eyes:  Negative for visual disturbance.  Respiratory:  Positive for cough. Negative for chest tightness, shortness of breath and wheezing.        Occasional cough, phlegm sometimes, mostly in am, sometime at night.   Cardiovascular:  Negative for chest pain, palpitations and leg swelling.  Gastrointestinal:  Negative for abdominal pain and constipation.  Genitourinary:  Negative for dysuria, frequency and urgency.  Musculoskeletal:  Positive for arthralgias and gait problem.  Skin:  Negative for color change.       Rosacea nose.   Neurological:  Negative for facial asymmetry, speech difficulty, weakness and light-headedness.       Migraine headache this morning.   Psychiatric/Behavioral:  Negative for behavioral problems and sleep disturbance. The patient is not nervous/anxious.     Health Maintenance  Topic Date Due   COVID-19 Vaccine (4 - Moderna series) 08/27/2020   INFLUENZA VACCINE  03/24/2022   TETANUS/TDAP  07/31/2026   Pneumonia Vaccine 87+ Years old  Completed   Zoster Vaccines- Shingrix  Completed   HPV VACCINES  Aged Out    Physical Exam: Vitals:   03/26/22 1410 03/26/22 1506  BP: 122/70 118/60  Resp: 18   Temp: (!) 97.2 F (36.2 C)   SpO2: 93%   Weight: 231 lb (104.8 kg)   Height: '5\' 11"'$  (1.803 m)    Body mass index is 32.22 kg/m. Physical Exam Vitals and nursing note reviewed.  Constitutional:      Appearance: Normal appearance.  HENT:      Head: Normocephalic and atraumatic.     Nose: No congestion or rhinorrhea.     Mouth/Throat:     Mouth: Mucous membranes are moist.  Eyes:     Extraocular Movements: Extraocular movements intact.     Conjunctiva/sclera: Conjunctivae normal.     Pupils: Pupils are equal, round, and reactive  to light.  Cardiovascular:     Rate and Rhythm: Normal rate and regular rhythm.     Heart sounds: No murmur heard. Pulmonary:     Effort: Pulmonary effort is normal.     Breath sounds: No rales.  Abdominal:     General: Bowel sounds are normal.     Palpations: Abdomen is soft.     Tenderness: There is no abdominal tenderness. There is no right CVA tenderness, left CVA tenderness, guarding or rebound.  Musculoskeletal:     Cervical back: Normal range of motion and neck supple.     Right lower leg: No edema.     Left lower leg: No edema.     Comments: Trace edema BLE  Skin:    General: Skin is warm and dry.     Comments: Dark redness in toes, Left great toe>R great toe, better after elevation. rosacea nose  Neurological:     General: No focal deficit present.     Mental Status: He is alert and oriented to person, place, and time. Mental status is at baseline.     Cranial Nerves: No cranial nerve deficit.     Sensory: No sensory deficit.     Motor: No weakness.     Coordination: Coordination normal.     Gait: Gait abnormal.  Psychiatric:        Mood and Affect: Mood normal.        Behavior: Behavior normal.        Thought Content: Thought content normal.        Judgment: Judgment normal.     Labs reviewed: Basic Metabolic Panel: Recent Labs    10/13/21 1050  NA 140  K 4.1  CL 105  CO2 23  GLUCOSE 121*  BUN 16  CREATININE 1.11  CALCIUM 8.8   Liver Function Tests: No results for input(s): "AST", "ALT", "ALKPHOS", "BILITOT", "PROT", "ALBUMIN" in the last 8760 hours. No results for input(s): "LIPASE", "AMYLASE" in the last 8760 hours. No results for input(s): "AMMONIA" in the  last 8760 hours. CBC: No results for input(s): "WBC", "NEUTROABS", "HGB", "HCT", "MCV", "PLT" in the last 8760 hours. Lipid Panel: No results for input(s): "CHOL", "HDL", "LDLCALC", "TRIG", "CHOLHDL", "LDLDIRECT" in the last 8760 hours. Lab Results  Component Value Date   HGBA1C 5.9 (H) 10/05/2019    Procedures since last visit: No results found.  Assessment/Plan  PVC (premature ventricular contraction) Non malignant, Cardiomyopathy, follows cardiology, takes Jardiance, Metoprolol, no cardiac cath  Gout of ankle No flare ups, takes Allopurinol '100mg'$  qd, R ankle. Uric acid 4.6 10/06/19  VTE (venous thromboembolism) Hx of PE/DVT(L leg), takes Eliquis 2.'5mg'$  bid,   Allergic rhinitis takes Flonase qd  Barrett's esophagus Barrett"s,  takes Omeprazole, declined GI f/u. Hgb 15.0 04/23/21  Hypertension Blood pressure is controlled, continue Metoprolol, Losartan. C/o lightheaded sometimes when standing up too quickly or too long, no significant orthostatic Bp change in the clinic today, will continue to observe.   Sleep apnea in adult not using BiPAP  Migraine headache with aura uses prn Imitrex.   Hyperlipidemia not taking statin.   Prediabetes takes Jardiance  Rosacea Apply 1% Metronidazole gel to nose bid x 14 days, may repeat as needed.    Labs/tests ordered:  none  Next appt:  6 months

## 2022-03-26 NOTE — Assessment & Plan Note (Signed)
takes Jardiance 

## 2022-03-26 NOTE — Assessment & Plan Note (Signed)
not taking statin.  

## 2022-03-26 NOTE — Assessment & Plan Note (Signed)
Barrett"s,  takes Omeprazole, declined GI f/u. Hgb 15.0 04/23/21

## 2022-03-26 NOTE — Assessment & Plan Note (Signed)
Hx of PE/DVT(L leg), takes Eliquis 2.'5mg'$  bid,

## 2022-03-26 NOTE — Assessment & Plan Note (Addendum)
Blood pressure is controlled, continue Metoprolol, Losartan. C/o lightheaded sometimes when standing up too quickly or too long, no significant orthostatic Bp change in the clinic today, will continue to observe.

## 2022-03-26 NOTE — Assessment & Plan Note (Signed)
not using BiPAP 

## 2022-03-26 NOTE — Assessment & Plan Note (Signed)
Non malignant, Cardiomyopathy, follows cardiology, takes Jardiance, Metoprolol, no cardiac cath

## 2022-03-27 ENCOUNTER — Encounter: Payer: Self-pay | Admitting: Nurse Practitioner

## 2022-03-27 MED ORDER — METRONIDAZOLE 1 % EX GEL: Freq: Two times a day (BID) | CUTANEOUS | 0 refills | Status: AC

## 2022-03-27 NOTE — Assessment & Plan Note (Signed)
Apply 1% Metronidazole gel to nose bid x 14 days, may repeat as needed.

## 2022-05-03 NOTE — Progress Notes (Unsigned)
Cardiology Office Note:    Date:  05/04/2022   ID:  Eric Chung, DOB 02-05-1935, MRN [id number]  PCP:  Mast, Man X, NP   [facility] Providers Cardiologist:  Lenna Sciara, MD Referring MD: Mast, Man X, NP   Chief Complaint/Reason for Referral:  Hospital follow up  ASSESSMENT:    Cardiomyopathy, unspecified type ([place]) - Plan: ECHOCARDIOGRAM COMPLETE  VTE (venous thromboembolism)  Primary hypertension  Aortic atherosclerosis ([place])    PLAN:    In order of problems listed above:  1.  Cardiomyopathy: The patient is not interested in any invasive procedures.  We will treat his cardiomyopathy with medications only.  I will obtain an echocardiogram now.  Depending on the results we may add spironolactone to his regimen.  Follow-up in 1 year or earlier if needed. 2.  Recurrent DVT: Continue indefinite anticoagulation 3.  Hypertension: His blood pressure is under good control. 4.  Hyperlipidemia: Given advanced age strict adherence to lipid goals this is being managed by the patient's primary care provider. 5.  Aortic atherosclerosis: Continue Eliquis in lieu of aspirin, will defer statin therapy in this elderly patient who is DNR and strict blood pressure control.             Dispo:  Return in about 1 year (around 05/05/2023).     Medication Adjustments/Labs and Tests Ordered: Current medicines are reviewed at length with the patient today.  Concerns regarding medicines are outlined above.   Tests Ordered: Orders Placed This Encounter  Procedures   ECHOCARDIOGRAM COMPLETE    Medication Changes: No orders of the defined types were placed in this encounter.   History of Present Illness:    FOCUSED PROBLEM LIST:   1.  DNR 2.  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  Gout 4.  GERD and Barrett's esophagus 5.  Hypertension 6.  Hyperlipidemia 7.  OSA not on CPAP or BIPAP 8.  Bifascicular block 9.  Cardiomyopathy with  ejection fraction ~35-40% 10.  Aortic atherosclerosis on CT abdomen pelvis 2019  November 2022: Patient seen for consultation regarding palpitations.  He was referred for an echocardiogram.  He was adamant about foregoing invasive procedures at that time.  February 2022: Patient seen for follow-up remotely seen on his echocardiogram.  He deferred coronary angiography.  At this point in time due to bradycardia his Toprol was decreased to 12.5 mg, losartan 25 mg and Jardiance 10 mg were started.  A repeat echocardiogram in May demonstrated an ejection fraction of 35-40%  Today: The patient continues to do well.  He feels like his breathing and energy level are much better.  He did have to place his wife in assisted living facility due to Alzheimer's dementia.  This is related to a lot of stress in his life.  In terms of physical activity he is not exercising as much as he would like to.  He however denies any significant cardiovascular symptoms.  He has not required any emergency room visits or hospitalizations.  He has been completely compliant with his medications.  He has had no severe bleeding or bruising episodes while on Eliquis.     Current Medications: Current Meds  Medication Sig   allopurinol (ZYLOPRIM) 100 MG tablet TAKE ONE TABLET BY MOUTH TWICE DAILY   apixaban (ELIQUIS) 2.5 MG TABS tablet Take 2.5 mg by mouth 2 (two) times a day.    empagliflozin (JARDIANCE) 10 MG TABS tablet Take 1 tablet (10 mg total) by mouth  daily before breakfast.   fluticasone (FLONASE) 50 MCG/ACT nasal spray Place 1 spray into both nostrils daily.    ibuprofen (ADVIL) 200 MG tablet Take 200 mg by mouth as needed.   latanoprost (XALATAN) 0.005 % ophthalmic solution Place 1 drop into both eyes at bedtime.   losartan (COZAAR) 25 MG tablet Take 1 tablet (25 mg total) by mouth daily.   metoprolol succinate (TOPROL XL) 25 MG 24 hr tablet Take 0.5 tablets (12.5 mg total) by mouth daily.   Multiple Vitamins-Minerals  (MULTIVITAMIN WITH MINERALS) tablet Take 1 tablet by mouth daily.   omeprazole (PRILOSEC) 20 MG capsule TAKE ONE CAPSULE BY MOUTH ONCE DAILY.   SUMAtriptan (IMITREX) 50 MG tablet TAKE 1 TABLET AS NEEDED FOR MIGRAINE.     Allergies:    Penicillins   Social History:   Social History   Tobacco Use   Smoking status: Former    Packs/day: 1.00    Years: 15.00    Total pack years: 15.00    Types: Cigarettes    Quit date: 08/24/1984    Years since quitting: 37.7   Smokeless tobacco: Never  Vaping Use   Vaping Use: Never used  Substance Use Topics   Alcohol use: Yes    Alcohol/week: 3.0 - 4.0 standard drinks of alcohol    Types: 3 - 4 Standard drinks or equivalent per week    Comment: 2-3 beers a month   Drug use: No     Family Hx: Family History  Problem Relation Age of Onset   Osteoporosis Mother    Cancer Father 68       esophagus   Crohn's disease Sister    Birth defects Maternal Grandmother        colon   Diabetes Paternal Grandmother      Review of Systems:   Please see the history of present illness.    All other systems reviewed and are negative.     EKGs/Labs/Other Test Reviewed:    EKG:  None today  Prior CV studies:  TTE 5/23 1. Left ventricular ejection fraction, by estimation, is 35 to 40%. The  left ventricle has moderately decreased function. The left ventricle  demonstrates global hypokinesis. Indeterminate diastolic filling due to  E-A fusion.   2. Right ventricular systolic function is normal. The right ventricular  size is mildly enlarged. Tricuspid regurgitation signal is inadequate for  assessing PA pressure.   3. Left atrial size was mildly dilated.   4. The mitral valve is grossly normal. Trivial mitral valve  regurgitation. No evidence of mitral stenosis.   5. The aortic valve is tricuspid. Aortic valve regurgitation is mild. No  aortic stenosis is present.   6. The inferior vena cava is normal in size with greater than 50%  respiratory  variability, suggesting right atrial pressure of 3 mmHg.   Comparison(s): No significant change from prior study.   Imaging studies that I have independently reviewed today: TTE; CT abdomen pelvis 2019 with aortic atherosclerosis  Recent Labs: 10/13/2021: BUN 16; Creatinine, Ser 1.11; Potassium 4.1; Sodium 140   Recent Lipid Panel Lab Results  Component Value Date/Time   CHOL 223 (H) 10/05/2019 07:00 AM   TRIG 153 (H) 10/05/2019 07:00 AM   HDL 43 10/05/2019 07:00 AM   LDLCALC 152 (H) 10/05/2019 07:00 AM    Risk Assessment/Calculations:          Physical Exam:    VS:  BP 128/64   Pulse 71   Ht 5'  11" (1.803 m)   Wt 231 lb (104.8 kg)   SpO2 95%   BMI 32.22 kg/m    Wt Readings from Last 3 Encounters:  05/04/22 231 lb (104.8 kg)  03/26/22 231 lb (104.8 kg)  10/06/21 (!) 335 lb (152 kg)    GENERAL:  No apparent distress, AOx3 HEENT:  No carotid bruits, +2 carotid impulses, no scleral icterus CAR: RRR with occasional ectopry no murmurs, gallops, rubs, or thrills RES:  Clear to auscultation bilaterally ABD:  Soft, nontender, nondistended, positive bowel sounds x 4 VASC:  +2 radial pulses, +2 carotid pulses, palpable pedal pulses NEURO:  CN 2-12 grossly intact; motor and sensory grossly intact PSYCH:  No active depression or anxiety EXT:  No edema, ecchymosis, or cyanosis  Signed, Early Osmond, MD  05/04/2022 3:11 PM    [address], [place], [place]  [address] Phone: [phone]; Fax: [phone]   Note:  This document was prepared using Dragon voice recognition software and may include unintentional dictation errors.

## 2022-05-04 ENCOUNTER — Ambulatory Visit: Payer: Medicare PPO | Attending: Internal Medicine | Admitting: Internal Medicine

## 2022-05-04 ENCOUNTER — Encounter: Payer: Self-pay | Admitting: Internal Medicine

## 2022-05-04 VITALS — BP 128/64 | HR 71 | Ht 71.0 in | Wt 231.0 lb

## 2022-05-04 DIAGNOSIS — I429 Cardiomyopathy, unspecified: Secondary | ICD-10-CM

## 2022-05-04 DIAGNOSIS — I8290 Acute embolism and thrombosis of unspecified vein: Secondary | ICD-10-CM | POA: Diagnosis not present

## 2022-05-04 DIAGNOSIS — I700 Atherosclerosis of aorta: Secondary | ICD-10-CM | POA: Diagnosis not present

## 2022-05-04 DIAGNOSIS — I10 Essential (primary) hypertension: Secondary | ICD-10-CM

## 2022-05-04 NOTE — Patient Instructions (Addendum)
Medication Instructions:  Your physician recommends that you continue on your current medications as directed. Please refer to the Current Medication list given to you today.  *If you need a refill on your cardiac medications before your next appointment, please call your pharmacy*   Lab Work: none If you have labs (blood work) drawn today and your tests are completely normal, you will receive your results only by: [place] (if you have MyChart) OR A paper copy in the mail If you have any lab test that is abnormal or we need to change your treatment, we will call you to review the results.   Testing/Procedures: Your physician has requested that you have an echocardiogram. Echocardiography is a painless test that uses sound waves to create images of your heart. It provides your doctor with information about the size and shape of your heart and how well your heart's chambers and valves are working. This procedure takes approximately one hour. There are no restrictions for this procedure. To be done in 3 months    Follow-Up: At [facility], you and your health needs are our priority.  As part of our continuing mission to provide you with exceptional heart care, we have created designated Provider Care Teams.  These Care Teams include your primary Cardiologist (physician) and Advanced Practice Providers (APPs -  Physician Assistants and Nurse Practitioners) who all work together to provide you with the care you need, when you need it.  We recommend signing up for the patient portal called "MyChart".  Sign up information is provided on this After Visit Summary.  MyChart is used to connect with patients for Virtual Visits (Telemedicine).  Patients are able to view lab/test results, encounter notes, upcoming appointments, etc.  Non-urgent messages can be sent to your provider as well.   To learn more about what you can do with MyChart, go to [email or web address].    Your next  appointment:   12 month(s)  The format for your next appointment:   In Person  Provider:   Early Osmond, MD     Other Instructions    Important Information About Sugar

## 2022-05-19 ENCOUNTER — Other Ambulatory Visit (HOSPITAL_COMMUNITY): Payer: Medicare PPO

## 2022-05-23 ENCOUNTER — Emergency Department (HOSPITAL_BASED_OUTPATIENT_CLINIC_OR_DEPARTMENT_OTHER)
Admission: EM | Admit: 2022-05-23 | Discharge: 2022-05-23 | Disposition: A | Discharge status: Home / Self Care | Payer: Medicare PPO | Attending: Emergency Medicine | Admitting: Emergency Medicine

## 2022-05-23 ENCOUNTER — Emergency Department (HOSPITAL_BASED_OUTPATIENT_CLINIC_OR_DEPARTMENT_OTHER): Payer: Medicare PPO

## 2022-05-23 ENCOUNTER — Encounter (HOSPITAL_BASED_OUTPATIENT_CLINIC_OR_DEPARTMENT_OTHER): Payer: Self-pay | Admitting: Emergency Medicine

## 2022-05-23 ENCOUNTER — Other Ambulatory Visit: Payer: Self-pay

## 2022-05-23 DIAGNOSIS — S299XXA Unspecified injury of thorax, initial encounter: Secondary | ICD-10-CM | POA: Diagnosis not present

## 2022-05-23 DIAGNOSIS — W19XXXA Unspecified fall, initial encounter: Secondary | ICD-10-CM | POA: Insufficient documentation

## 2022-05-23 DIAGNOSIS — Z20822 Contact with and (suspected) exposure to covid-19: Secondary | ICD-10-CM | POA: Diagnosis not present

## 2022-05-23 DIAGNOSIS — S42201A Unspecified fracture of upper end of right humerus, initial encounter for closed fracture: Secondary | ICD-10-CM | POA: Diagnosis not present

## 2022-05-23 DIAGNOSIS — M25511 Pain in right shoulder: Secondary | ICD-10-CM | POA: Diagnosis not present

## 2022-05-23 DIAGNOSIS — S098XXA Other specified injuries of head, initial encounter: Secondary | ICD-10-CM | POA: Diagnosis not present

## 2022-05-23 DIAGNOSIS — S4991XA Unspecified injury of right shoulder and upper arm, initial encounter: Secondary | ICD-10-CM | POA: Diagnosis not present

## 2022-05-23 DIAGNOSIS — Z7901 Long term (current) use of anticoagulants: Secondary | ICD-10-CM | POA: Diagnosis not present

## 2022-05-23 DIAGNOSIS — I10 Essential (primary) hypertension: Secondary | ICD-10-CM | POA: Diagnosis not present

## 2022-05-23 DIAGNOSIS — Z79899 Other long term (current) drug therapy: Secondary | ICD-10-CM | POA: Insufficient documentation

## 2022-05-23 DIAGNOSIS — R42 Dizziness and giddiness: Secondary | ICD-10-CM | POA: Insufficient documentation

## 2022-05-23 DIAGNOSIS — Y9222 Religious institution as the place of occurrence of the external cause: Secondary | ICD-10-CM | POA: Diagnosis not present

## 2022-05-23 DIAGNOSIS — S199XXA Unspecified injury of neck, initial encounter: Secondary | ICD-10-CM | POA: Diagnosis not present

## 2022-05-23 HISTORY — DX: Other pulmonary embolism without acute cor pulmonale: I26.99

## 2022-05-23 LAB — CBC
HCT: 47.4 % (ref 39.0–52.0)
Hemoglobin: 15.7 g/dL (ref 13.0–17.0)
MCH: 30.6 pg (ref 26.0–34.0)
MCHC: 33.1 g/dL (ref 30.0–36.0)
MCV: 92.4 fL (ref 80.0–100.0)
Platelets: 234 10*3/uL (ref 150–400)
RBC: 5.13 MIL/uL (ref 4.22–5.81)
RDW: 15 % (ref 11.5–15.5)
WBC: 7.8 10*3/uL (ref 4.0–10.5)
nRBC: 0 % (ref 0.0–0.2)

## 2022-05-23 LAB — URINALYSIS, ROUTINE W REFLEX MICROSCOPIC
Bilirubin Urine: NEGATIVE
Glucose, UA: >1000 mg/dL — AB
Hgb urine dipstick: NEGATIVE
Ketones, ur: NEGATIVE mg/dL
Leukocytes,Ua: NEGATIVE
Nitrite: NEGATIVE
Specific Gravity, Urine: 1.03 (ref 1.005–1.030)
pH: 5 (ref 5.0–8.0)

## 2022-05-23 LAB — COMPREHENSIVE METABOLIC PANEL
ALT: 14 U/L (ref 0–44)
AST: 20 U/L (ref 15–41)
Albumin: 4 g/dL (ref 3.5–5.0)
Alkaline Phosphatase: 79 U/L (ref 38–126)
Anion gap: 9 (ref 5–15)
BUN: 18 mg/dL (ref 8–23)
CO2: 22 mmol/L (ref 22–32)
Calcium: 9.5 mg/dL (ref 8.9–10.3)
Chloride: 105 mmol/L (ref 98–111)
Creatinine, Ser: 1.29 mg/dL — ABNORMAL HIGH (ref 0.61–1.24)
GFR, Estimated: 54 mL/min — ABNORMAL LOW (ref >60)
Glucose, Bld: 133 mg/dL — ABNORMAL HIGH (ref 70–99)
Potassium: 4.1 mmol/L (ref 3.5–5.1)
Sodium: 136 mmol/L (ref 135–145)
Total Bilirubin: 1 mg/dL (ref 0.3–1.2)
Total Protein: 7.4 g/dL (ref 6.5–8.1)

## 2022-05-23 LAB — PROTIME-INR
INR: 1.2 (ref 0.8–1.2)
Prothrombin Time: 15.3 seconds — ABNORMAL HIGH (ref 11.4–15.2)

## 2022-05-23 LAB — ETHANOL: Alcohol, Ethyl (B): <10 mg/dL (ref <10)

## 2022-05-23 LAB — RESP PANEL BY RT-PCR (FLU A&B, COVID) ARPGX2
Influenza A by PCR: NEGATIVE
Influenza B by PCR: NEGATIVE
SARS Coronavirus 2 by RT PCR: NEGATIVE

## 2022-05-23 LAB — LACTIC ACID, PLASMA: Lactic Acid, Venous: 1.5 mmol/L (ref 0.5–1.9)

## 2022-05-23 MED ORDER — FENTANYL CITRATE PF 50 MCG/ML IJ SOSY
50.0000 ug | PREFILLED_SYRINGE | Freq: Once | INTRAMUSCULAR | Status: AC
  Administered 2022-05-23: 50 ug via INTRAVENOUS
  Filled 2022-05-23: qty 1

## 2022-05-23 MED ORDER — HYDROCODONE-ACETAMINOPHEN 5-325 MG PO TABS
1.0000 | ORAL_TABLET | Freq: Once | ORAL | Status: AC
  Administered 2022-05-23: 1 via ORAL
  Filled 2022-05-23: qty 1

## 2022-05-23 MED ORDER — HYDROCODONE-ACETAMINOPHEN 5-325 MG PO TABS: 1.0000 | ORAL_TABLET | Freq: Four times a day (QID) | ORAL | 0 refills | Status: DC | PRN

## 2022-05-23 NOTE — ED Notes (Signed)
Patient transported to X-ray 

## 2022-05-23 NOTE — ED Notes (Signed)
Patient transported to CT 

## 2022-05-23 NOTE — ED Triage Notes (Signed)
On eliquis for hx PE

## 2022-05-23 NOTE — ED Triage Notes (Signed)
[place]pped [place] [employer] [place] on his right side, Immediate right shoulder pain, saw"stars", felt dizzy, nausea. This happened an hour ago. Unsure if he hit head.

## 2022-05-23 NOTE — ED Provider Notes (Signed)
[facility] EMERGENCY DEPT Provider Note   CSN: [id number] Arrival date & time: 05/23/22  1150     History  Chief Complaint  Patient presents with   [name]    Eric Chung is a 87 y.o. male.   Fall     87 year old male with medical history significant for Barrett's esophagus, sleep, HTN, obesity, pulmonary emboli on Eliquis who presents to the emergency department after a fall at church.  The patient states that he tripped in between [place] landed on his right side, landing on his right shoulder.  He is unclear if he hit his head or lost consciousness but felt that he "saw stars" and immediately felt nausea with some lightheadedness.  He denies any other injuries or complaints.  He last took his Eliquis this morning.  He arrived to the emergency department GCS 15, ABC intact.  On arrival, he endorses right-sided proximal humerus pain that was sharp and severe and worse with any attempts at range of motion.  He endorsed some numbness along his hand in the ulnar nerve distribution.  Home Medications Prior to Admission medications   Medication Sig Start Date End Date Taking? Authorizing Provider  HYDROcodone-acetaminophen (NORCO/VICODIN) 5-325 MG tablet Take 1-2 tablets by mouth every 6 (six) hours as needed. 05/23/22  Yes Regan Lemming, MD  allopurinol (ZYLOPRIM) 100 MG tablet TAKE ONE TABLET BY MOUTH TWICE DAILY 01/12/22   Mast, Man X, NP  apixaban (ELIQUIS) 2.5 MG TABS tablet Take 2.5 mg by mouth 2 (two) times a day.  03/08/19   [provider]  empagliflozin (JARDIANCE) 10 MG TABS tablet Take 1 tablet (10 mg total) by mouth daily before breakfast. 12/24/21   Early Osmond, MD  fluticasone (FLONASE) 50 MCG/ACT nasal spray Place 1 spray into both nostrils daily.     [provider]  ibuprofen (ADVIL) 200 MG tablet Take 200 mg by mouth as needed.    [provider]  latanoprost (XALATAN) 0.005 % ophthalmic solution Place 1 drop into both eyes  at bedtime. 07/05/17   [provider]  losartan (COZAAR) 25 MG tablet Take 1 tablet (25 mg total) by mouth daily. 10/06/21   Early Osmond, MD  metoprolol succinate (TOPROL XL) 25 MG 24 hr tablet Take 0.5 tablets (12.5 mg total) by mouth daily. 10/06/21   Early Osmond, MD  Multiple Vitamins-Minerals (MULTIVITAMIN WITH MINERALS) tablet Take 1 tablet by mouth daily.    [provider]  omeprazole (PRILOSEC) 20 MG capsule TAKE ONE CAPSULE BY MOUTH ONCE DAILY. 09/01/21   Mast, Man X, NP  SUMAtriptan (IMITREX) 50 MG tablet TAKE 1 TABLET AS NEEDED FOR MIGRAINE. 11/03/21   Mast, Man X, NP      Allergies    Penicillins    Review of Systems   Review of Systems  All other systems reviewed and are negative.   Physical Exam Updated Vital Signs BP 110/72   Pulse 67   Temp 98 F (36.7 C) (Oral)   Resp 18   SpO2 93%  Physical Exam Vitals and nursing note reviewed.  Constitutional:      Appearance: He is well-developed.     Comments: GCS 15, ABC intact  HENT:     Head: Normocephalic.  Eyes:     Conjunctiva/sclera: Conjunctivae normal.  Neck:     Comments: No midline tenderness to palpation of the cervical spine. ROM intact. Cardiovascular:     Rate and Rhythm: Normal rate and regular rhythm.  Pulmonary:  Effort: Pulmonary effort is normal. No respiratory distress.     Breath sounds: Normal breath sounds.  Chest:     Comments: Chest wall stable and non-tender to AP and lateral compression. Clavicles stable and non-tender to AP compression Abdominal:     Palpations: Abdomen is soft.     Tenderness: There is no abdominal tenderness.     Comments: Pelvis stable to lateral compression.  Musculoskeletal:     Cervical back: Neck supple.     Comments: No midline tenderness to palpation of the thoracic or lumbar spine. Extremities atraumatic with intact ROM with the exception of tenderness to palpation of the proximal humerus on the right and pain with any attempted  range of motion.  Distal right upper extremity neurovascular intact with 2+ radial pulses, intact motor function along the median, ulnar, radial nerve distributions.  Sensation grossly intact.  Skin:    General: Skin is warm and dry.  Neurological:     Mental Status: He is alert.     Comments: CN II-XII grossly intact. Moving all four extremities spontaneously and sensation grossly intact.     ED Results / Procedures / Treatments   Labs (all labs ordered are listed, but only abnormal results are displayed) Labs Reviewed  COMPREHENSIVE METABOLIC PANEL - Abnormal; Notable for the following components:      Result Value   Glucose, Bld 133 (*)    Creatinine, Ser 1.29 (*)    GFR, Estimated 54 (*)    All other components within normal limits  URINALYSIS, ROUTINE W REFLEX MICROSCOPIC - Abnormal; Notable for the following components:   Glucose, UA >1,000 (*)    Protein, ur TRACE (*)    All other components within normal limits  PROTIME-INR - Abnormal; Notable for the following components:   Prothrombin Time 15.3 (*)    All other components within normal limits  RESP PANEL BY RT-PCR (FLU A&B, COVID) ARPGX2  CBC  ETHANOL  LACTIC ACID, PLASMA    EKG None  Radiology DG Shoulder Right Portable  Result Date: 05/23/2022 CLINICAL DATA:  Tripped and fell onto the right side. Right shoulder pain. EXAM: RIGHT SHOULDER - 1 VIEW COMPARISON:  None Available. FINDINGS: Suspect a nondisplaced fracture of the proximal humerus across the surgical neck, nondisplaced and non comminuted. This is not well-defined on the submitted images. No other evidence of a fracture.  No bone lesion. Glenohumeral and AC joints are normally aligned. Soft tissues are unremarkable. IMPRESSION: 1. Probable nondisplaced, non comminuted fracture of the proximal humerus. No dislocation. Electronically Signed   By: Lajean Manes M.D.   On: 05/23/2022 13:52   DG Chest Port 1 View  Result Date: 05/23/2022 CLINICAL DATA:   Trauma. EXAM: PORTABLE CHEST 1 VIEW COMPARISON:  02/29/2020 FINDINGS: Lung volumes are low and there is mild asymmetric elevation of the right hemidiaphragm. Chronic coarsened interstitial markings are noted throughout both lungs. No sign of pleural effusion, interstitial edema or airspace consolidation. The visualized osseous structures appear grossly intact. IMPRESSION: 1. Low lung volumes and chronic interstitial coarsening. 2. No acute findings. Electronically Signed   By: Kerby Moors M.D.   On: 05/23/2022 13:50   CT HEAD WO CONTRAST  Result Date: 05/23/2022 CLINICAL DATA:  Trauma.  Status post fall EXAM: CT HEAD WITHOUT CONTRAST CT CERVICAL SPINE WITHOUT CONTRAST TECHNIQUE: Multidetector CT imaging of the head and cervical spine was performed following the standard protocol without intravenous contrast. Multiplanar CT image reconstructions of the cervical spine were also generated. RADIATION  DOSE REDUCTION: This exam was performed according to the departmental dose-optimization program which includes automated exposure control, adjustment of the mA and/or kV according to patient size and/or use of iterative reconstruction technique. COMPARISON:  None Available. FINDINGS: CT HEAD FINDINGS Brain: No evidence of acute infarction, hemorrhage, hydrocephalus, extra-axial collection or mass lesion/mass effect. There is mild diffuse low-attenuation within the subcortical and periventricular white matter compatible with chronic microvascular disease. Prominence of sulci and ventricles compatible with brain atrophy. Vascular: No hyperdense vessel or unexpected calcification. Skull: Normal. Negative for fracture or focal lesion. Sinuses/Orbits: Paranasal sinuses and mastoid air cells are clear. Other: No signs of scalp hematoma. CT CERVICAL SPINE FINDINGS Alignment: No acute posttraumatic mild alignment. Straightening of normal cervical lordosis. Skull base and vertebrae: No acute fracture. No primary bone lesion  or focal pathologic process. Soft tissues and spinal canal: No prevertebral fluid or swelling. No visible canal hematoma. Disc levels: Multilevel disc space narrowing with endplate spurring is noted involving C3-4 through C7-T1. Mild bilateral facet arthropathy. Upper chest: A mosaic attenuation pattern is noted throughout the visualized portions of the upper lung zones. Other: None IMPRESSION: 1. No acute intracranial abnormality. 2. Chronic microvascular disease and brain atrophy. 3. No evidence for cervical spine fracture or subluxation. 4. Cervical degenerative disc disease and facet arthropathy. Electronically Signed   By: Kerby Moors M.D.   On: 05/23/2022 13:47   CT CERVICAL SPINE WO CONTRAST  Result Date: 05/23/2022 CLINICAL DATA:  Trauma.  Status post fall EXAM: CT HEAD WITHOUT CONTRAST CT CERVICAL SPINE WITHOUT CONTRAST TECHNIQUE: Multidetector CT imaging of the head and cervical spine was performed following the standard protocol without intravenous contrast. Multiplanar CT image reconstructions of the cervical spine were also generated. RADIATION DOSE REDUCTION: This exam was performed according to the departmental dose-optimization program which includes automated exposure control, adjustment of the mA and/or kV according to patient size and/or use of iterative reconstruction technique. COMPARISON:  None Available. FINDINGS: CT HEAD FINDINGS Brain: No evidence of acute infarction, hemorrhage, hydrocephalus, extra-axial collection or mass lesion/mass effect. There is mild diffuse low-attenuation within the subcortical and periventricular white matter compatible with chronic microvascular disease. Prominence of sulci and ventricles compatible with brain atrophy. Vascular: No hyperdense vessel or unexpected calcification. Skull: Normal. Negative for fracture or focal lesion. Sinuses/Orbits: Paranasal sinuses and mastoid air cells are clear. Other: No signs of scalp hematoma. CT CERVICAL SPINE  FINDINGS Alignment: No acute posttraumatic mild alignment. Straightening of normal cervical lordosis. Skull base and vertebrae: No acute fracture. No primary bone lesion or focal pathologic process. Soft tissues and spinal canal: No prevertebral fluid or swelling. No visible canal hematoma. Disc levels: Multilevel disc space narrowing with endplate spurring is noted involving C3-4 through C7-T1. Mild bilateral facet arthropathy. Upper chest: A mosaic attenuation pattern is noted throughout the visualized portions of the upper lung zones. Other: None IMPRESSION: 1. No acute intracranial abnormality. 2. Chronic microvascular disease and brain atrophy. 3. No evidence for cervical spine fracture or subluxation. 4. Cervical degenerative disc disease and facet arthropathy. Electronically Signed   By: Kerby Moors M.D.   On: 05/23/2022 13:47    Procedures Procedures    Medications Ordered in ED Medications  fentaNYL (SUBLIMAZE) injection 50 mcg (50 mcg Intravenous Given 05/23/22 1309)  HYDROcodone-acetaminophen (NORCO/VICODIN) 5-325 MG per tablet 1 tablet (1 tablet Oral Given 05/23/22 1404)    ED Course/ Medical Decision Making/ A&P  Medical Decision Making Amount and/or Complexity of Data Reviewed Labs: ordered. Radiology: ordered.  Risk Prescription drug management.    87 year old male with medical history significant for Barrett's esophagus, sleep, HTN, obesity, pulmonary emboli on Eliquis who presents to the emergency department after a fall at church.  The patient states that he tripped in between [place] landed on his right side, landing on his right shoulder.  He is unclear if he hit his head or lost consciousness but felt that he "saw stars" and immediately felt nausea with some lightheadedness.  He denies any other injuries or complaints.  He last took his Eliquis this morning.  He arrived to the emergency department GCS 15, ABC intact.  On arrival, he endorses  right-sided proximal humerus pain that was sharp and severe and worse with any attempts at range of motion.  He endorsed some numbness along his hand in the ulnar nerve distribution.   On arrival, the patient was vitally stable.  Physical exam significant for: No midline tenderness to palpation of the thoracic or lumbar spine. Extremities atraumatic with intact ROM with the exception of tenderness to palpation of the proximal humerus on the right and pain with any attempted range of motion.  Distal right upper extremity neurovascular intact with 2+ radial pulses, intact motor function along the median, ulnar, radial nerve distributions.  Sensation grossly intact.  Trauma work-up performed to include laboratory work-up which revealed urinalysis negative for UTI, COVID-19 influenza PCR testing negative, ethanol level negative, CBC unremarkable, CMP generally unremarkable with a stable serum creatinine at 1.29, lactic acid normal, INR normal.   CT head and cervical spine negative for acute traumatic injury.  Chest x-ray without focal airspace disease, no evidence of rib fracture or pneumothorax.  X-ray imaging of the right shoulder was performed and revealed the following: IMPRESSION:  1. Probable nondisplaced, non comminuted fracture of the proximal  humerus. No dislocation.     He had initially complained of some numbness in the ulnar nerve distribution on arrival but this has since resolved.  Favor likely contusion of the ulnar nerve in the setting of trauma.  Concern for CVA/TIA.The patient was informed of his diagnosis of likely nondisplaced proximal humerus fracture.  He was placed in a shoulder immobilizer/sling.  The patient was neurovascularly intact following application of a shoulder sling/immobilizer.  I did speak with Dr. Stann Mainland of on-call orthopedics who recommended immobilization, outpatient follow-up in clinic, pain control.  The patient was ambulatory at time of discharge, Norco sent to  his pharmacy outpatient and he was advised to call for follow-up with orthopedics.    Final Clinical Impression(s) / ED Diagnoses Final diagnoses:  Closed fracture of proximal end of right humerus, unspecified fracture morphology, initial encounter    Rx / DC Orders ED Discharge Orders          Ordered    AMB referral to orthopedics        05/23/22 1442    HYDROcodone-acetaminophen (NORCO/VICODIN) 5-325 MG tablet  Every 6 hours PRN        05/23/22 1456              Regan Lemming, MD 05/23/22 1617

## 2022-05-23 NOTE — ED Triage Notes (Signed)
Right hand feels like it is asleep.

## 2022-05-23 NOTE — Discharge Instructions (Addendum)
Please follow-up with Dr. Stann Mainland of orthopedics for your proximal humerus fracture.  Your numbness was likely due to contusion of your ulnar nerve and has resolved. Norco has been sent for pain control.

## 2022-05-28 ENCOUNTER — Telehealth: Payer: Self-pay | Admitting: *Deleted

## 2022-05-28 DIAGNOSIS — H401131 Primary open-angle glaucoma, bilateral, mild stage: Secondary | ICD-10-CM | POA: Diagnosis not present

## 2022-05-28 NOTE — Telephone Encounter (Signed)
     Patient  visit on 05/23/2022  at [facility] was for fall  Have you been able to follow up with your primary care physician? resident  The patient was able to obtain any needed medicine or equipment.  Are there diet recommendations that you are having difficulty following?  Patient expresses understanding of discharge instructions and education provided has no other needs at this time.    [facility] [phone] [address] , [place] [address] Email : [email or web address]

## 2022-06-02 ENCOUNTER — Other Ambulatory Visit: Payer: Self-pay | Admitting: Nurse Practitioner

## 2022-06-03 DIAGNOSIS — S42201A Unspecified fracture of upper end of right humerus, initial encounter for closed fracture: Secondary | ICD-10-CM | POA: Diagnosis not present

## 2022-06-09 ENCOUNTER — Other Ambulatory Visit: Payer: Self-pay | Admitting: Nurse Practitioner

## 2022-06-09 NOTE — Telephone Encounter (Signed)
Pharmacy requested refill.  Pended Rx and sent to [facility] for approval.

## 2022-06-17 DIAGNOSIS — R29898 Other symptoms and signs involving the musculoskeletal system: Secondary | ICD-10-CM | POA: Diagnosis not present

## 2022-06-17 DIAGNOSIS — M6281 Muscle weakness (generalized): Secondary | ICD-10-CM | POA: Diagnosis not present

## 2022-06-17 DIAGNOSIS — S42201D Unspecified fracture of upper end of right humerus, subsequent encounter for fracture with routine healing: Secondary | ICD-10-CM | POA: Diagnosis not present

## 2022-06-19 DIAGNOSIS — M6281 Muscle weakness (generalized): Secondary | ICD-10-CM | POA: Diagnosis not present

## 2022-06-19 DIAGNOSIS — R29898 Other symptoms and signs involving the musculoskeletal system: Secondary | ICD-10-CM | POA: Diagnosis not present

## 2022-06-23 DIAGNOSIS — M6281 Muscle weakness (generalized): Secondary | ICD-10-CM | POA: Diagnosis not present

## 2022-06-23 DIAGNOSIS — R29898 Other symptoms and signs involving the musculoskeletal system: Secondary | ICD-10-CM | POA: Diagnosis not present

## 2022-06-25 ENCOUNTER — Encounter: Payer: Medicare PPO | Admitting: Nurse Practitioner

## 2022-06-26 DIAGNOSIS — R29898 Other symptoms and signs involving the musculoskeletal system: Secondary | ICD-10-CM | POA: Diagnosis not present

## 2022-06-26 DIAGNOSIS — M6281 Muscle weakness (generalized): Secondary | ICD-10-CM | POA: Diagnosis not present

## 2022-06-30 DIAGNOSIS — R29898 Other symptoms and signs involving the musculoskeletal system: Secondary | ICD-10-CM | POA: Diagnosis not present

## 2022-06-30 DIAGNOSIS — M6281 Muscle weakness (generalized): Secondary | ICD-10-CM | POA: Diagnosis not present

## 2022-07-03 DIAGNOSIS — M6281 Muscle weakness (generalized): Secondary | ICD-10-CM | POA: Diagnosis not present

## 2022-07-03 DIAGNOSIS — R29898 Other symptoms and signs involving the musculoskeletal system: Secondary | ICD-10-CM | POA: Diagnosis not present

## 2022-07-07 DIAGNOSIS — M6281 Muscle weakness (generalized): Secondary | ICD-10-CM | POA: Diagnosis not present

## 2022-07-07 DIAGNOSIS — R29898 Other symptoms and signs involving the musculoskeletal system: Secondary | ICD-10-CM | POA: Diagnosis not present

## 2022-07-08 DIAGNOSIS — S42201D Unspecified fracture of upper end of right humerus, subsequent encounter for fracture with routine healing: Secondary | ICD-10-CM | POA: Diagnosis not present

## 2022-07-09 DIAGNOSIS — R29898 Other symptoms and signs involving the musculoskeletal system: Secondary | ICD-10-CM | POA: Diagnosis not present

## 2022-07-09 DIAGNOSIS — M6281 Muscle weakness (generalized): Secondary | ICD-10-CM | POA: Diagnosis not present

## 2022-07-20 ENCOUNTER — Other Ambulatory Visit: Payer: Self-pay | Admitting: Nurse Practitioner

## 2022-07-21 DIAGNOSIS — I82512 Chronic embolism and thrombosis of left femoral vein: Secondary | ICD-10-CM | POA: Diagnosis not present

## 2022-07-21 DIAGNOSIS — R29898 Other symptoms and signs involving the musculoskeletal system: Secondary | ICD-10-CM | POA: Diagnosis not present

## 2022-07-21 DIAGNOSIS — Z7901 Long term (current) use of anticoagulants: Secondary | ICD-10-CM | POA: Diagnosis not present

## 2022-07-21 DIAGNOSIS — I2782 Chronic pulmonary embolism: Secondary | ICD-10-CM | POA: Diagnosis not present

## 2022-07-21 DIAGNOSIS — M6281 Muscle weakness (generalized): Secondary | ICD-10-CM | POA: Diagnosis not present

## 2022-07-24 ENCOUNTER — Other Ambulatory Visit: Payer: Self-pay

## 2022-07-24 ENCOUNTER — Emergency Department (HOSPITAL_BASED_OUTPATIENT_CLINIC_OR_DEPARTMENT_OTHER)
Admission: EM | Admit: 2022-07-24 | Discharge: 2022-07-24 | Disposition: A | Discharge status: Home / Self Care | Payer: Medicare PPO | Attending: Emergency Medicine | Admitting: Emergency Medicine

## 2022-07-24 ENCOUNTER — Emergency Department (HOSPITAL_BASED_OUTPATIENT_CLINIC_OR_DEPARTMENT_OTHER): Payer: Medicare PPO

## 2022-07-24 ENCOUNTER — Encounter (HOSPITAL_BASED_OUTPATIENT_CLINIC_OR_DEPARTMENT_OTHER): Payer: Self-pay | Admitting: Emergency Medicine

## 2022-07-24 DIAGNOSIS — N390 Urinary tract infection, site not specified: Secondary | ICD-10-CM | POA: Insufficient documentation

## 2022-07-24 DIAGNOSIS — N200 Calculus of kidney: Secondary | ICD-10-CM | POA: Diagnosis not present

## 2022-07-24 DIAGNOSIS — R102 Pelvic and perineal pain: Secondary | ICD-10-CM | POA: Diagnosis present

## 2022-07-24 DIAGNOSIS — B9689 Other specified bacterial agents as the cause of diseases classified elsewhere: Secondary | ICD-10-CM | POA: Diagnosis not present

## 2022-07-24 DIAGNOSIS — Z7901 Long term (current) use of anticoagulants: Secondary | ICD-10-CM | POA: Diagnosis not present

## 2022-07-24 DIAGNOSIS — K5730 Diverticulosis of large intestine without perforation or abscess without bleeding: Secondary | ICD-10-CM | POA: Diagnosis not present

## 2022-07-24 LAB — CBC WITH DIFFERENTIAL/PLATELET
Abs Immature Granulocytes: 0.03 10*3/uL (ref 0.00–0.07)
Basophils Absolute: 0 10*3/uL (ref 0.0–0.1)
Basophils Relative: 1 %
Eosinophils Absolute: 0.2 10*3/uL (ref 0.0–0.5)
Eosinophils Relative: 3 %
HCT: 46.7 % (ref 39.0–52.0)
Hemoglobin: 15.3 g/dL (ref 13.0–17.0)
Immature Granulocytes: 0 %
Lymphocytes Relative: 36 %
Lymphs Abs: 2.4 10*3/uL (ref 0.7–4.0)
MCH: 30.5 pg (ref 26.0–34.0)
MCHC: 32.8 g/dL (ref 30.0–36.0)
MCV: 93 fL (ref 80.0–100.0)
Monocytes Absolute: 0.7 10*3/uL (ref 0.1–1.0)
Monocytes Relative: 10 %
Neutro Abs: 3.4 10*3/uL (ref 1.7–7.7)
Neutrophils Relative %: 50 %
Platelets: 235 10*3/uL (ref 150–400)
RBC: 5.02 MIL/uL (ref 4.22–5.81)
RDW: 15 % (ref 11.5–15.5)
WBC: 6.8 10*3/uL (ref 4.0–10.5)
nRBC: 0 % (ref 0.0–0.2)

## 2022-07-24 LAB — BASIC METABOLIC PANEL
Anion gap: 9 (ref 5–15)
BUN: 15 mg/dL (ref 8–23)
CO2: 23 mmol/L (ref 22–32)
Calcium: 9.1 mg/dL (ref 8.9–10.3)
Chloride: 106 mmol/L (ref 98–111)
Creatinine, Ser: 1.29 mg/dL — ABNORMAL HIGH (ref 0.61–1.24)
GFR, Estimated: 54 mL/min — ABNORMAL LOW (ref >60)
Glucose, Bld: 112 mg/dL — ABNORMAL HIGH (ref 70–99)
Potassium: 4.1 mmol/L (ref 3.5–5.1)
Sodium: 138 mmol/L (ref 135–145)

## 2022-07-24 LAB — URINALYSIS, ROUTINE W REFLEX MICROSCOPIC
Bilirubin Urine: NEGATIVE
Glucose, UA: >1000 mg/dL — AB
Ketones, ur: NEGATIVE mg/dL
Leukocytes,Ua: NEGATIVE
Nitrite: NEGATIVE
Specific Gravity, Urine: 1.025 (ref 1.005–1.030)
pH: 5.5 (ref 5.0–8.0)

## 2022-07-24 MED ORDER — CEPHALEXIN 250 MG PO CAPS
500.0000 mg | ORAL_CAPSULE | Freq: Once | ORAL | Status: AC
  Administered 2022-07-24: 500 mg via ORAL
  Filled 2022-07-24: qty 2

## 2022-07-24 MED ORDER — CEPHALEXIN 500 MG PO CAPS: 500.0000 mg | ORAL_CAPSULE | Freq: Two times a day (BID) | ORAL | 0 refills | Status: DC

## 2022-07-24 MED ORDER — ONDANSETRON 4 MG PO TBDP: 4.0000 mg | ORAL_TABLET | Freq: Three times a day (TID) | ORAL | 0 refills | Status: DC | PRN

## 2022-07-24 NOTE — Discharge Instructions (Addendum)
If you develop fever, and, no improvement in your symptoms, or any other new/concerning symptoms and return to the ER for evaluation.

## 2022-07-24 NOTE — ED Triage Notes (Signed)
Pt arrives to ED with c/o dysuria, urinary frequency and groin pain x1 week.

## 2022-07-24 NOTE — ED Provider Notes (Signed)
[facility] EMERGENCY DEPT Provider Note   CSN: [id number] Arrival date & time: 07/24/22  0813     History  Chief Complaint  Patient presents with   Urinary Frequency    Eric Chung is a 87 y.o. male.  HPI 87 year old male presents with dysuria and urinary frequency.  He is also having penile pain.  The dysuria is primarily just before urinating and then during urination.  No discharge.  He denies fevers, vomiting, or back pain.  He has had some bilateral side/groin pain over the same 9 days but states he thinks that might be from a pulled muscle.  He thinks he might of done that when helping his wife who has Alzheimer's with her walker versus when he has been in physical therapy.  He keeps thinking the dysuria is going to get better and then it seems to come back.  He has been taking some ibuprofen.  He is not sexually active. No rectal pain.  Home Medications Prior to Admission medications   Medication Sig Start Date End Date Taking? Authorizing Provider  cephALEXin (KEFLEX) 500 MG capsule Take 1 capsule (500 mg total) by mouth 2 (two) times daily for 5 days. 07/24/22 07/29/22 Yes Sherwood Gambler, MD  ondansetron (ZOFRAN-ODT) 4 MG disintegrating tablet Take 1 tablet (4 mg total) by mouth every 8 (eight) hours as needed for nausea or vomiting. 07/24/22  Yes Sherwood Gambler, MD  allopurinol (ZYLOPRIM) 100 MG tablet TAKE ONE TABLET BY MOUTH TWICE DAILY 07/20/22   Mast, Man X, NP  apixaban (ELIQUIS) 2.5 MG TABS tablet Take 2.5 mg by mouth 2 (two) times a day.  03/08/19   [provider]  empagliflozin (JARDIANCE) 10 MG TABS tablet Take 1 tablet (10 mg total) by mouth daily before breakfast. 12/24/21   Early Osmond, MD  fluticasone (FLONASE) 50 MCG/ACT nasal spray Place 1 spray into both nostrils daily.     [provider]  HYDROcodone-acetaminophen (NORCO/VICODIN) 5-325 MG tablet Take 1-2 tablets by mouth every 6 (six) hours as needed. 05/23/22   Regan Lemming, MD  ibuprofen (ADVIL) 200 MG tablet Take 200 mg by mouth as needed.    [provider]  latanoprost (XALATAN) 0.005 % ophthalmic solution Place 1 drop into both eyes at bedtime. 07/05/17   [provider]  losartan (COZAAR) 25 MG tablet Take 1 tablet (25 mg total) by mouth daily. 10/06/21   Early Osmond, MD  metoprolol succinate (TOPROL XL) 25 MG 24 hr tablet Take 0.5 tablets (12.5 mg total) by mouth daily. 10/06/21   Early Osmond, MD  Multiple Vitamins-Minerals (MULTIVITAMIN WITH MINERALS) tablet Take 1 tablet by mouth daily.    [provider]  omeprazole (PRILOSEC) 20 MG capsule TAKE ONE CAPSULE BY MOUTH ONCE DAILY. 06/02/22   Mast, Man X, NP  SUMAtriptan (IMITREX) 50 MG tablet TAKE 1 TABLET AS NEEDED FOR MIGRAINE. 06/09/22   Mast, Man X, NP      Allergies    Penicillins    Review of Systems   Review of Systems  Constitutional:  Negative for fever.  Gastrointestinal:  Negative for abdominal pain and vomiting.  Genitourinary:  Positive for dysuria, frequency and penile pain. Negative for flank pain, penile discharge and testicular pain.  Musculoskeletal:  Negative for back pain.    Physical Exam Updated Vital Signs BP 126/68 (BP Location: Right Arm)   Pulse 67   Temp 97.8 F (36.6 C) (Oral)   Resp 18   Ht 5'  11" (1.803 m)   Wt 104.8 kg   SpO2 95%   BMI 32.22 kg/m  Physical Exam Vitals and nursing note reviewed.  Constitutional:      General: He is not in acute distress.    Appearance: He is well-developed. He is not ill-appearing or diaphoretic.  HENT:     Head: Normocephalic and atraumatic.  Cardiovascular:     Rate and Rhythm: Normal rate and regular rhythm.     Heart sounds: Normal heart sounds.  Pulmonary:     Effort: Pulmonary effort is normal.     Breath sounds: Normal breath sounds.  Abdominal:     Palpations: Abdomen is soft.     Tenderness: There is no abdominal tenderness. There is no right CVA tenderness or left CVA  tenderness.  Genitourinary:    Penis: No erythema, tenderness or discharge.      Testes:        Right: Tenderness not present.        Left: Tenderness not present.  Skin:    General: Skin is warm and dry.  Neurological:     Mental Status: He is alert.     ED Results / Procedures / Treatments   Labs (all labs ordered are listed, but only abnormal results are displayed) Labs Reviewed  URINALYSIS, ROUTINE W REFLEX MICROSCOPIC - Abnormal; Notable for the following components:      Result Value   Glucose, UA >1,000 (*)    Hgb urine dipstick TRACE (*)    Protein, ur TRACE (*)    Bacteria, UA RARE (*)    All other components within normal limits  BASIC METABOLIC PANEL - Abnormal; Notable for the following components:   Glucose, Bld 112 (*)    Creatinine, Ser 1.29 (*)    GFR, Estimated 54 (*)    All other components within normal limits  URINE CULTURE  CBC WITH DIFFERENTIAL/PLATELET    EKG None  Radiology No results found. IMPRESSION: 1. Two nonobstructing RIGHT renal calculi. No ureterolithiasis or obstructive uropathy. 2. No bladder calculi. 3. Multiple lesions right Bosniak II benign renal cyst. No follow-up imaging is recommended. JACR 2018 Feb; 264-273, Management of the Incidental Renal Mass on CT, [employer] 2021; 814-848, Bosniak Classification of Cystic Renal Masses, Version 2019. 4. Benign-appearing hepatic cysts.   Electronically Signed By: Suzy Bouchard M.D. On: 07/24/2022 10:02   Procedures Procedures    Medications Ordered in ED Medications  cephALEXin (KEFLEX) capsule 500 mg (500 mg Oral Given 07/24/22 1043)    ED Course/ Medical Decision Making/ A&P                           Medical Decision Making Amount and/or Complexity of Data Reviewed Labs: ordered.    Details: Creatinine is stable from baseline normal WBC.  Urinalysis shows some rare bacteria and some small amount of WBCs Radiology: ordered and independent interpretation  performed.    Details: No ureteral stone.  Has multiple cysts in the liver and kidney.  Risk Prescription drug management.   Patient presents with dysuria.  Seems to be on and off.  Urinalysis is not impressive and so further work-up was obtained which includes labs and CT.  There is no obvious ureteral stone or other emergent condition.  Has benign-appearing cyst.  Could be a false negative UA given his symptoms and sizing putting him on antibiotics is reasonable.  We will have him follow-up with PCP.  Otherwise, has a  benign exam in general and I think can follow-up with PCP and have the culture obtained and change antibiotics as needed.  Otherwise, stable for discharge.        Final Clinical Impression(s) / ED Diagnoses Final diagnoses:  Acute urinary tract infection    Rx / DC Orders ED Discharge Orders          Ordered    cephALEXin (KEFLEX) 500 MG capsule  2 times daily        07/24/22 1040    ondansetron (ZOFRAN-ODT) 4 MG disintegrating tablet  Every 8 hours PRN        07/24/22 1040              Sherwood Gambler, MD 07/24/22 1426

## 2022-07-25 LAB — URINE CULTURE: Culture: NO GROWTH

## 2022-07-26 ENCOUNTER — Emergency Department (HOSPITAL_COMMUNITY): Payer: Medicare PPO

## 2022-07-26 ENCOUNTER — Encounter (HOSPITAL_COMMUNITY): Payer: Self-pay

## 2022-07-26 ENCOUNTER — Observation Stay (HOSPITAL_COMMUNITY)
Admission: EM | Admit: 2022-07-26 | Discharge: 2022-07-28 | Disposition: A | Discharge status: Home / Self Care | Payer: Medicare PPO | Attending: Internal Medicine | Admitting: Internal Medicine

## 2022-07-26 DIAGNOSIS — K2289 Other specified disease of esophagus: Secondary | ICD-10-CM | POA: Diagnosis not present

## 2022-07-26 DIAGNOSIS — E669 Obesity, unspecified: Secondary | ICD-10-CM | POA: Insufficient documentation

## 2022-07-26 DIAGNOSIS — M109 Gout, unspecified: Secondary | ICD-10-CM | POA: Insufficient documentation

## 2022-07-26 DIAGNOSIS — R0789 Other chest pain: Secondary | ICD-10-CM | POA: Diagnosis not present

## 2022-07-26 DIAGNOSIS — I82402 Acute embolism and thrombosis of unspecified deep veins of left lower extremity: Secondary | ICD-10-CM | POA: Diagnosis not present

## 2022-07-26 DIAGNOSIS — K449 Diaphragmatic hernia without obstruction or gangrene: Secondary | ICD-10-CM | POA: Diagnosis not present

## 2022-07-26 DIAGNOSIS — Z6831 Body mass index (BMI) 31.0-31.9, adult: Secondary | ICD-10-CM | POA: Diagnosis not present

## 2022-07-26 DIAGNOSIS — I10 Essential (primary) hypertension: Secondary | ICD-10-CM | POA: Diagnosis present

## 2022-07-26 DIAGNOSIS — I130 Hypertensive heart and chronic kidney disease with heart failure and stage 1 through stage 4 chronic kidney disease, or unspecified chronic kidney disease: Secondary | ICD-10-CM | POA: Insufficient documentation

## 2022-07-26 DIAGNOSIS — J9601 Acute respiratory failure with hypoxia: Secondary | ICD-10-CM | POA: Insufficient documentation

## 2022-07-26 DIAGNOSIS — N182 Chronic kidney disease, stage 2 (mild): Secondary | ICD-10-CM | POA: Insufficient documentation

## 2022-07-26 DIAGNOSIS — M25512 Pain in left shoulder: Secondary | ICD-10-CM | POA: Diagnosis not present

## 2022-07-26 DIAGNOSIS — R079 Chest pain, unspecified: Principal | ICD-10-CM

## 2022-07-26 DIAGNOSIS — Z7984 Long term (current) use of oral hypoglycemic drugs: Secondary | ICD-10-CM | POA: Diagnosis not present

## 2022-07-26 DIAGNOSIS — R61 Generalized hyperhidrosis: Secondary | ICD-10-CM | POA: Diagnosis not present

## 2022-07-26 DIAGNOSIS — G43109 Migraine with aura, not intractable, without status migrainosus: Secondary | ICD-10-CM | POA: Diagnosis present

## 2022-07-26 DIAGNOSIS — G4730 Sleep apnea, unspecified: Secondary | ICD-10-CM | POA: Diagnosis present

## 2022-07-26 DIAGNOSIS — R609 Edema, unspecified: Secondary | ICD-10-CM | POA: Diagnosis not present

## 2022-07-26 DIAGNOSIS — Z87891 Personal history of nicotine dependence: Secondary | ICD-10-CM | POA: Diagnosis not present

## 2022-07-26 DIAGNOSIS — Z86711 Personal history of pulmonary embolism: Secondary | ICD-10-CM | POA: Diagnosis not present

## 2022-07-26 DIAGNOSIS — N281 Cyst of kidney, acquired: Secondary | ICD-10-CM | POA: Diagnosis not present

## 2022-07-26 DIAGNOSIS — K2270 Barrett's esophagus without dysplasia: Secondary | ICD-10-CM | POA: Diagnosis present

## 2022-07-26 DIAGNOSIS — I5022 Chronic systolic (congestive) heart failure: Secondary | ICD-10-CM | POA: Insufficient documentation

## 2022-07-26 DIAGNOSIS — I129 Hypertensive chronic kidney disease with stage 1 through stage 4 chronic kidney disease, or unspecified chronic kidney disease: Secondary | ICD-10-CM | POA: Diagnosis present

## 2022-07-26 DIAGNOSIS — E785 Hyperlipidemia, unspecified: Secondary | ICD-10-CM | POA: Diagnosis present

## 2022-07-26 DIAGNOSIS — N200 Calculus of kidney: Secondary | ICD-10-CM | POA: Diagnosis not present

## 2022-07-26 DIAGNOSIS — Z7901 Long term (current) use of anticoagulants: Secondary | ICD-10-CM | POA: Insufficient documentation

## 2022-07-26 DIAGNOSIS — I2510 Atherosclerotic heart disease of native coronary artery without angina pectoris: Secondary | ICD-10-CM | POA: Diagnosis not present

## 2022-07-26 LAB — CBC
HCT: 46.3 % (ref 39.0–52.0)
Hemoglobin: 15.1 g/dL (ref 13.0–17.0)
MCH: 30.9 pg (ref 26.0–34.0)
MCHC: 32.6 g/dL (ref 30.0–36.0)
MCV: 94.7 fL (ref 80.0–100.0)
Platelets: 241 10*3/uL (ref 150–400)
RBC: 4.89 MIL/uL (ref 4.22–5.81)
RDW: 14.6 % (ref 11.5–15.5)
WBC: 11 10*3/uL — ABNORMAL HIGH (ref 4.0–10.5)
nRBC: 0 % (ref 0.0–0.2)

## 2022-07-26 LAB — BASIC METABOLIC PANEL
Anion gap: 7 (ref 5–15)
BUN: 16 mg/dL (ref 8–23)
CO2: 23 mmol/L (ref 22–32)
Calcium: 8.8 mg/dL — ABNORMAL LOW (ref 8.9–10.3)
Chloride: 109 mmol/L (ref 98–111)
Creatinine, Ser: 1.25 mg/dL — ABNORMAL HIGH (ref 0.61–1.24)
GFR, Estimated: 56 mL/min — ABNORMAL LOW (ref >60)
Glucose, Bld: 130 mg/dL — ABNORMAL HIGH (ref 70–99)
Potassium: 4.2 mmol/L (ref 3.5–5.1)
Sodium: 139 mmol/L (ref 135–145)

## 2022-07-26 LAB — TROPONIN I (HIGH SENSITIVITY)
Troponin I (High Sensitivity): 30 ng/L — ABNORMAL HIGH (ref <18)
Troponin I (High Sensitivity): 29 ng/L — ABNORMAL HIGH (ref <18)

## 2022-07-26 MED ORDER — IOHEXOL 350 MG/ML SOLN
100.0000 mL | Freq: Once | INTRAVENOUS | Status: AC | PRN
  Administered 2022-07-26: 100 mL via INTRAVENOUS

## 2022-07-26 MED ORDER — NITROGLYCERIN 0.4 MG SL SUBL
0.4000 mg | SUBLINGUAL_TABLET | SUBLINGUAL | Status: DC | PRN
  Administered 2022-07-26 – 2022-07-27 (×4): 0.4 mg via SUBLINGUAL
  Filled 2022-07-26 (×3): qty 1

## 2022-07-26 MED ORDER — ASPIRIN 81 MG PO CHEW
324.0000 mg | CHEWABLE_TABLET | Freq: Once | ORAL | Status: AC
  Administered 2022-07-26: 324 mg via ORAL
  Filled 2022-07-26: qty 4

## 2022-07-26 MED ORDER — MORPHINE SULFATE (PF) 4 MG/ML IV SOLN
4.0000 mg | Freq: Once | INTRAVENOUS | Status: AC
  Administered 2022-07-26: 4 mg via INTRAVENOUS
  Filled 2022-07-26: qty 1

## 2022-07-26 NOTE — ED Notes (Signed)
Patient transported to CT 

## 2022-07-26 NOTE — ED Provider Notes (Signed)
[facility] DEPT Provider Note   CSN: [id number] Arrival date & time: 07/26/22  2050     History  Chief Complaint  Patient presents with   Chest Pain    Eric Chung is a 87 y.o. male history of DVT and PE, on Eliquis, hypertension, presenting to the ED with complaint of chest pain.  Patient reports he began having sudden discomfort in his chest mostly left-sided this evening around 6 PM while he was at rest, watching television.  He says the pain has been waxing and waning but then increasing, now is radiating across his back to both sides of his chest.  He has felt diaphoretic, nauseated, and the symptoms are worse with exertion and walking into the emergency department.  He reports he has been compliant with his medication including Eliquis.  He is not on aspirin or Plavix.  He denies any known history of coronary disease but has never had a left heart catheterization.  HPI     Home Medications Prior to Admission medications   Medication Sig Start Date End Date Taking? Authorizing Provider  allopurinol (ZYLOPRIM) 100 MG tablet TAKE ONE TABLET BY MOUTH TWICE DAILY 07/20/22   Mast, Man X, NP  apixaban (ELIQUIS) 2.5 MG TABS tablet Take 2.5 mg by mouth 2 (two) times a day.  03/08/19   [provider]  cephALEXin (KEFLEX) 500 MG capsule Take 1 capsule (500 mg total) by mouth 2 (two) times daily for 5 days. 07/24/22 07/29/22  Sherwood Gambler, MD  empagliflozin (JARDIANCE) 10 MG TABS tablet Take 1 tablet (10 mg total) by mouth daily before breakfast. 12/24/21   Early Osmond, MD  fluticasone (FLONASE) 50 MCG/ACT nasal spray Place 1 spray into both nostrils daily.     [provider]  HYDROcodone-acetaminophen (NORCO/VICODIN) 5-325 MG tablet Take 1-2 tablets by mouth every 6 (six) hours as needed. 05/23/22   Regan Lemming, MD  ibuprofen (ADVIL) 200 MG tablet Take 200 mg by mouth as needed.    [provider]  latanoprost (XALATAN)  0.005 % ophthalmic solution Place 1 drop into both eyes at bedtime. 07/05/17   [provider]  losartan (COZAAR) 25 MG tablet Take 1 tablet (25 mg total) by mouth daily. 10/06/21   Early Osmond, MD  metoprolol succinate (TOPROL XL) 25 MG 24 hr tablet Take 0.5 tablets (12.5 mg total) by mouth daily. 10/06/21   Early Osmond, MD  Multiple Vitamins-Minerals (MULTIVITAMIN WITH MINERALS) tablet Take 1 tablet by mouth daily.    [provider]  omeprazole (PRILOSEC) 20 MG capsule TAKE ONE CAPSULE BY MOUTH ONCE DAILY. 06/02/22   Mast, Man X, NP  ondansetron (ZOFRAN-ODT) 4 MG disintegrating tablet Take 1 tablet (4 mg total) by mouth every 8 (eight) hours as needed for nausea or vomiting. 07/24/22   Sherwood Gambler, MD  SUMAtriptan (IMITREX) 50 MG tablet TAKE 1 TABLET AS NEEDED FOR MIGRAINE. 06/09/22   Mast, Man X, NP      Allergies    Penicillins    Review of Systems   Review of Systems  Physical Exam Updated Vital Signs BP (!) 113/59   Pulse 77   Temp 98.2 F (36.8 C)   Resp 16   SpO2 93%  Physical Exam Constitutional:      General: He is not in acute distress.    Appearance: He is obese.  HENT:     Head: Normocephalic and atraumatic.  Eyes:     Conjunctiva/sclera: Conjunctivae normal.  Pupils: Pupils are equal, round, and reactive to light.  Cardiovascular:     Rate and Rhythm: Normal rate and regular rhythm.  Pulmonary:     Effort: Pulmonary effort is normal. No respiratory distress.     Comments: 95% on 2l [place] Abdominal:     General: There is no distension.     Tenderness: There is no abdominal tenderness.  Skin:    General: Skin is warm and dry.  Neurological:     General: No focal deficit present.     Mental Status: He is alert. Mental status is at baseline.  Psychiatric:        Mood and Affect: Mood normal.        Behavior: Behavior normal.     ED Results / Procedures / Treatments   Labs (all labs ordered are listed, but only abnormal  results are displayed) Labs Reviewed  BASIC METABOLIC PANEL - Abnormal; Notable for the following components:      Result Value   Glucose, Bld 130 (*)    Creatinine, Ser 1.25 (*)    Calcium 8.8 (*)    GFR, Estimated 56 (*)    All other components within normal limits  CBC - Abnormal; Notable for the following components:   WBC 11.0 (*)    All other components within normal limits  TROPONIN I (HIGH SENSITIVITY) - Abnormal; Notable for the following components:   Troponin I (High Sensitivity) 30 (*)    All other components within normal limits  TROPONIN I (HIGH SENSITIVITY) - Abnormal; Notable for the following components:   Troponin I (High Sensitivity) 29 (*)    All other components within normal limits    EKG EKG Interpretation  Date/Time:  Sunday July 26 2022 21:06:53 EST Ventricular Rate:  69 PR Interval:  190 QRS Duration: 180 QT Interval:  430 QTC Calculation: 461 R Axis:   -74 Text Interpretation: Sinus arrhythmia Multiple ventricular premature complexes RBBB and LAFB Probable left ventricular hypertrophy Confirmed by Octaviano Glow [phone]) on 07/26/2022 9:25:23 PM  Radiology DG Chest Port 1 View  Result Date: 07/26/2022 CLINICAL DATA:  Chest pain EXAM: PORTABLE CHEST 1 VIEW COMPARISON:  Chest x-ray 05/23/2022 FINDINGS: Heart is enlarged, unchanged. There is no focal lung infiltrate, pleural effusion or pneumothorax. No acute fractures are seen. IMPRESSION: No active disease. Electronically Signed   By: Ronney Asters M.D.   On: 07/26/2022 21:19    Procedures Procedures    Medications Ordered in ED Medications  nitroGLYCERIN (NITROSTAT) SL tablet 0.4 mg (0.4 mg Sublingual Given 07/26/22 2338)  aspirin chewable tablet 324 mg (324 mg Oral Given 07/26/22 2139)  iohexol (OMNIPAQUE) 350 MG/ML injection 100 mL (100 mLs Intravenous Contrast Given 07/26/22 2231)  morphine (PF) 4 MG/ML injection 4 mg (4 mg Intravenous Given 07/26/22 2339)    ED Course/ Medical Decision  Making/ A&P Clinical Course as of 07/26/22 2342  Sun Jul 26, 2022  2212 Patient's chest pain did improve with several nitroglycerin, significantly, now low intensity.  I will discuss with cardiologist.  [MT]  2318 I spoke to Dr Alfred Levins from cardiology, regarding the patient's work-up.  He agreed with the plan for a CT dissection study.  If this is negative, and the patient's pain is relatively low or minimal, he recommended medical admission on heparin overnight at [facility], with cardiology consultation in the morning.  There is no indication for emergent transfer to [facility].  However the patient were to develop new concerning EKG changes,  or troponins were to significant elevated, the hospital team could contact him for transfer and possible cardiac intervention. [MT]    Clinical Course User Index [MT] Coltin Casher, Carola Rhine, MD                           Medical Decision Making Amount and/or Complexity of Data Reviewed Labs: ordered. Radiology: ordered.  Risk OTC drugs. Prescription drug management.   This patient presents to the Emergency Department with complaint of chest pain. This involves an extensive number of treatment options, and is a complaint that carries with it a high risk of complications and morbidity, given the patient's comorbidity, including cardiovascular risk factors.The differential diagnosis includes ACS vs Pneumothorax vs Reflux/Gastritis vs MSK pain vs Pneumonia vs other.   The patient's pain is evolved from left posterior shoulder now to the bilateral chest and back, and is worsening with nausea, I felt that a dissection study would be the priority.  He is not hypertensive or tachycardic but he is on beta-blockers at home.  It is difficult to exclude pulmonary embolism as well but he has been therapeutic on his Eliquis and compliant with this, and I would expect a dissection study may provide some insight into the proximal pulmonary segments for large PE.  This  presentation would also be concerning for potential acute coronary syndrome, as he has never had a coronary work-up specifically before.  I have asked her sublingual nitroglycerin and aspirin to be given.  I ordered, reviewed, and interpreted labs.  Pertinent results include BMP and CBC at baseline.  Initial troponin 30 -> 29. I ordered medication nitroglycerin, aspirin for chest pain I ordered imaging studies which included CT a dissection study CT study pending at the time of signout to DR Ralene Bathe EDP I personally reviewed the patients ECG which showed chronic bifascicular block pattern with no significant or acute ischemic changes.  I spoke to cardiology by phone, Dr Alfred Levins for [facility].  Please see ED course.  Please note, I also spoke to the patient about cardiologist's office note reference to not wanting to pursue "aggressive cardiac intervention." The patient said that he is not aware of this, but he would want intervention as necessary, including cardiac catheterization, if there were concerns for blockages around the heart.  On my reassessment the patient improvement of his pain after cervical nitro, now minimal, but still present.         Final Clinical Impression(s) / ED Diagnoses Final diagnoses:  Chest pain, unspecified type    Rx / DC Orders ED Discharge Orders     None         Wyvonnia Dusky, MD 07/26/22 2342

## 2022-07-26 NOTE — ED Notes (Signed)
Pt reports his pain is much improved after 2nd nitro. Now only has a pain with deep inspiration. Edp made aware

## 2022-07-26 NOTE — ED Notes (Signed)
Blue tube sent to lab

## 2022-07-26 NOTE — ED Triage Notes (Signed)
Patient arrived stating at 6pm he began having intermittent, left sided shoulder/chest pain. States he has felt lightheaded, diaphoretic, and nausea.

## 2022-07-27 ENCOUNTER — Encounter (HOSPITAL_COMMUNITY): Payer: Self-pay | Admitting: Family Medicine

## 2022-07-27 ENCOUNTER — Other Ambulatory Visit: Payer: Self-pay

## 2022-07-27 ENCOUNTER — Observation Stay (HOSPITAL_BASED_OUTPATIENT_CLINIC_OR_DEPARTMENT_OTHER): Payer: Medicare PPO

## 2022-07-27 DIAGNOSIS — R079 Chest pain, unspecified: Secondary | ICD-10-CM | POA: Diagnosis not present

## 2022-07-27 DIAGNOSIS — I824Y2 Acute embolism and thrombosis of unspecified deep veins of left proximal lower extremity: Secondary | ICD-10-CM

## 2022-07-27 DIAGNOSIS — E782 Mixed hyperlipidemia: Secondary | ICD-10-CM

## 2022-07-27 DIAGNOSIS — M10071 Idiopathic gout, right ankle and foot: Secondary | ICD-10-CM | POA: Diagnosis not present

## 2022-07-27 DIAGNOSIS — J9601 Acute respiratory failure with hypoxia: Secondary | ICD-10-CM

## 2022-07-27 DIAGNOSIS — I5022 Chronic systolic (congestive) heart failure: Secondary | ICD-10-CM | POA: Diagnosis not present

## 2022-07-27 DIAGNOSIS — R0781 Pleurodynia: Secondary | ICD-10-CM

## 2022-07-27 DIAGNOSIS — G4730 Sleep apnea, unspecified: Secondary | ICD-10-CM | POA: Diagnosis not present

## 2022-07-27 DIAGNOSIS — N182 Chronic kidney disease, stage 2 (mild): Secondary | ICD-10-CM

## 2022-07-27 DIAGNOSIS — I129 Hypertensive chronic kidney disease with stage 1 through stage 4 chronic kidney disease, or unspecified chronic kidney disease: Secondary | ICD-10-CM

## 2022-07-27 DIAGNOSIS — K2270 Barrett's esophagus without dysplasia: Secondary | ICD-10-CM | POA: Diagnosis not present

## 2022-07-27 DIAGNOSIS — I420 Dilated cardiomyopathy: Secondary | ICD-10-CM

## 2022-07-27 DIAGNOSIS — I10 Essential (primary) hypertension: Secondary | ICD-10-CM

## 2022-07-27 DIAGNOSIS — Z86718 Personal history of other venous thrombosis and embolism: Secondary | ICD-10-CM | POA: Diagnosis not present

## 2022-07-27 DIAGNOSIS — R7989 Other specified abnormal findings of blood chemistry: Secondary | ICD-10-CM | POA: Diagnosis not present

## 2022-07-27 DIAGNOSIS — R071 Chest pain on breathing: Secondary | ICD-10-CM

## 2022-07-27 LAB — MRSA NEXT GEN BY PCR, NASAL: MRSA by PCR Next Gen: NOT DETECTED

## 2022-07-27 LAB — BASIC METABOLIC PANEL
Anion gap: 6 (ref 5–15)
BUN: 17 mg/dL (ref 8–23)
CO2: 23 mmol/L (ref 22–32)
Calcium: 8.6 mg/dL — ABNORMAL LOW (ref 8.9–10.3)
Chloride: 107 mmol/L (ref 98–111)
Creatinine, Ser: 1.12 mg/dL (ref 0.61–1.24)
GFR, Estimated: >60 mL/min (ref >60)
Glucose, Bld: 146 mg/dL — ABNORMAL HIGH (ref 70–99)
Potassium: 4.5 mmol/L (ref 3.5–5.1)
Sodium: 136 mmol/L (ref 135–145)

## 2022-07-27 LAB — HEPARIN LEVEL (UNFRACTIONATED)
Heparin Unfractionated: 0.59 IU/mL (ref 0.30–0.70)
Heparin Unfractionated: 0.85 IU/mL — ABNORMAL HIGH (ref 0.30–0.70)

## 2022-07-27 LAB — URINALYSIS, ROUTINE W REFLEX MICROSCOPIC
Bacteria, UA: NONE SEEN
Bilirubin Urine: NEGATIVE
Glucose, UA: >=500 mg/dL — AB
Hgb urine dipstick: NEGATIVE
Ketones, ur: NEGATIVE mg/dL
Leukocytes,Ua: NEGATIVE
Nitrite: NEGATIVE
Protein, ur: NEGATIVE mg/dL
Specific Gravity, Urine: 1.015 (ref 1.005–1.030)
pH: 6 (ref 5.0–8.0)

## 2022-07-27 LAB — CBC
HCT: 44.4 % (ref 39.0–52.0)
Hemoglobin: 14.3 g/dL (ref 13.0–17.0)
MCH: 30.6 pg (ref 26.0–34.0)
MCHC: 32.2 g/dL (ref 30.0–36.0)
MCV: 95.1 fL (ref 80.0–100.0)
Platelets: 212 10*3/uL (ref 150–400)
RBC: 4.67 MIL/uL (ref 4.22–5.81)
RDW: 14.8 % (ref 11.5–15.5)
WBC: 9.7 10*3/uL (ref 4.0–10.5)
nRBC: 0 % (ref 0.0–0.2)

## 2022-07-27 LAB — PROTIME-INR
INR: 1.2 (ref 0.8–1.2)
Prothrombin Time: 14.9 seconds (ref 11.4–15.2)

## 2022-07-27 LAB — APTT
aPTT: 27 seconds (ref 24–36)
aPTT: 57 seconds — ABNORMAL HIGH (ref 24–36)

## 2022-07-27 LAB — ECHOCARDIOGRAM COMPLETE
Area-P 1/2: 3.4 cm2
Calc EF: 41 %
Height: 71 in
S' Lateral: 4.6 cm
Single Plane A2C EF: 49.1 %
Single Plane A4C EF: 30.1 %
Weight: 3664.93 oz

## 2022-07-27 LAB — SEDIMENTATION RATE: Sed Rate: 11 mm/hr (ref 0–16)

## 2022-07-27 LAB — TROPONIN I (HIGH SENSITIVITY)
Troponin I (High Sensitivity): 67 ng/L — ABNORMAL HIGH (ref <18)
Troponin I (High Sensitivity): 62 ng/L — ABNORMAL HIGH (ref <18)

## 2022-07-27 LAB — C-REACTIVE PROTEIN: CRP: 5.6 mg/dL — ABNORMAL HIGH (ref <1.0)

## 2022-07-27 MED ORDER — EMPAGLIFLOZIN 10 MG PO TABS
10.0000 mg | ORAL_TABLET | Freq: Every day | ORAL | Status: DC
  Administered 2022-07-27 – 2022-07-28 (×2): 10 mg via ORAL
  Filled 2022-07-27 (×2): qty 1

## 2022-07-27 MED ORDER — IPRATROPIUM BROMIDE 0.02 % IN SOLN: 0.5000 mg | Freq: Four times a day (QID) | RESPIRATORY_TRACT | Status: DC | PRN

## 2022-07-27 MED ORDER — LOSARTAN POTASSIUM 50 MG PO TABS
25.0000 mg | ORAL_TABLET | Freq: Every day | ORAL | Status: DC
  Administered 2022-07-27: 25 mg via ORAL
  Filled 2022-07-27: qty 1

## 2022-07-27 MED ORDER — LATANOPROST 0.005 % OP SOLN
1.0000 [drp] | Freq: Every day | OPHTHALMIC | Status: DC
  Administered 2022-07-27 (×2): 1 [drp] via OPHTHALMIC
  Filled 2022-07-27: qty 2.5

## 2022-07-27 MED ORDER — CHLORHEXIDINE GLUCONATE CLOTH 2 % EX PADS
6.0000 | MEDICATED_PAD | Freq: Every day | CUTANEOUS | Status: DC
  Administered 2022-07-27 – 2022-07-28 (×2): 6 via TOPICAL

## 2022-07-27 MED ORDER — ACETAMINOPHEN 325 MG PO TABS: 650.0000 mg | ORAL_TABLET | ORAL | Status: DC | PRN

## 2022-07-27 MED ORDER — PERFLUTREN LIPID MICROSPHERE
1.0000 mL | INTRAVENOUS | Status: AC | PRN
  Administered 2022-07-27: 2 mL via INTRAVENOUS

## 2022-07-27 MED ORDER — ATORVASTATIN CALCIUM 40 MG PO TABS
80.0000 mg | ORAL_TABLET | Freq: Every day | ORAL | Status: DC
  Administered 2022-07-27: 80 mg via ORAL
  Filled 2022-07-27: qty 2

## 2022-07-27 MED ORDER — SODIUM CHLORIDE 0.9 % IV BOLUS
500.0000 mL | Freq: Once | INTRAVENOUS | Status: AC
  Administered 2022-07-27: 500 mL via INTRAVENOUS

## 2022-07-27 MED ORDER — HYOSCYAMINE SULFATE 0.125 MG SL SUBL
0.2500 mg | SUBLINGUAL_TABLET | Freq: Once | SUBLINGUAL | Status: AC
  Administered 2022-07-27: 0.25 mg via SUBLINGUAL
  Filled 2022-07-27: qty 2

## 2022-07-27 MED ORDER — APIXABAN 2.5 MG PO TABS: 2.5000 mg | ORAL_TABLET | Freq: Two times a day (BID) | ORAL | Status: DC

## 2022-07-27 MED ORDER — MORPHINE SULFATE (PF) 2 MG/ML IV SOLN
1.0000 mg | INTRAVENOUS | Status: DC | PRN
  Administered 2022-07-27: 1 mg via INTRAVENOUS
  Filled 2022-07-27: qty 1

## 2022-07-27 MED ORDER — ASPIRIN 325 MG PO TBEC
325.0000 mg | DELAYED_RELEASE_TABLET | Freq: Every day | ORAL | Status: DC
  Administered 2022-07-27: 325 mg via ORAL
  Filled 2022-07-27: qty 1

## 2022-07-27 MED ORDER — DICYCLOMINE HCL 10 MG/5ML PO SOLN
10.0000 mg | Freq: Once | ORAL | Status: AC
  Administered 2022-07-27: 10 mg via ORAL
  Filled 2022-07-27: qty 5

## 2022-07-27 MED ORDER — METOPROLOL TARTRATE 25 MG PO TABS
100.0000 mg | ORAL_TABLET | Freq: Once | ORAL | Status: AC
  Administered 2022-07-28: 100 mg via ORAL
  Filled 2022-07-27: qty 4

## 2022-07-27 MED ORDER — LEVALBUTEROL HCL 0.63 MG/3ML IN NEBU: 0.6300 mg | INHALATION_SOLUTION | Freq: Four times a day (QID) | RESPIRATORY_TRACT | Status: DC | PRN

## 2022-07-27 MED ORDER — COLCHICINE 0.6 MG PO TABS
0.6000 mg | ORAL_TABLET | Freq: Two times a day (BID) | ORAL | Status: DC
  Administered 2022-07-27 – 2022-07-28 (×3): 0.6 mg via ORAL
  Filled 2022-07-27 (×3): qty 1

## 2022-07-27 MED ORDER — PANTOPRAZOLE SODIUM 40 MG PO TBEC
40.0000 mg | DELAYED_RELEASE_TABLET | Freq: Every day | ORAL | Status: DC
  Administered 2022-07-27 – 2022-07-28 (×2): 40 mg via ORAL
  Filled 2022-07-27 (×2): qty 1

## 2022-07-27 MED ORDER — GUAIFENESIN ER 600 MG PO TB12
1200.0000 mg | ORAL_TABLET | Freq: Two times a day (BID) | ORAL | Status: DC
  Administered 2022-07-27 – 2022-07-28 (×3): 1200 mg via ORAL
  Filled 2022-07-27 (×3): qty 2

## 2022-07-27 MED ORDER — ALLOPURINOL 100 MG PO TABS
100.0000 mg | ORAL_TABLET | Freq: Two times a day (BID) | ORAL | Status: DC
  Administered 2022-07-27 – 2022-07-28 (×4): 100 mg via ORAL
  Filled 2022-07-27 (×4): qty 1

## 2022-07-27 MED ORDER — POLYVINYL ALCOHOL 1.4 % OP SOLN
1.0000 [drp] | OPHTHALMIC | Status: DC | PRN
  Administered 2022-07-27 – 2022-07-28 (×2): 1 [drp] via OPHTHALMIC
  Filled 2022-07-27: qty 15

## 2022-07-27 MED ORDER — ONDANSETRON HCL 4 MG/2ML IJ SOLN
4.0000 mg | Freq: Four times a day (QID) | INTRAMUSCULAR | Status: DC | PRN
  Administered 2022-07-27: 4 mg via INTRAVENOUS
  Filled 2022-07-27: qty 2

## 2022-07-27 MED ORDER — MORPHINE SULFATE (PF) 2 MG/ML IV SOLN: 2.0000 mg | INTRAVENOUS | Status: DC | PRN

## 2022-07-27 MED ORDER — OXYCODONE HCL 5 MG PO TABS
5.0000 mg | ORAL_TABLET | Freq: Four times a day (QID) | ORAL | Status: DC | PRN
  Administered 2022-07-27 (×2): 5 mg via ORAL
  Filled 2022-07-27 (×2): qty 1

## 2022-07-27 MED ORDER — SACUBITRIL-VALSARTAN 24-26 MG PO TABS
1.0000 | ORAL_TABLET | Freq: Two times a day (BID) | ORAL | Status: DC
  Administered 2022-07-27 – 2022-07-28 (×2): 1 via ORAL
  Filled 2022-07-27 (×3): qty 1

## 2022-07-27 MED ORDER — ORAL CARE MOUTH RINSE: 15.0000 mL | OROMUCOSAL | Status: DC | PRN

## 2022-07-27 MED ORDER — APIXABAN 2.5 MG PO TABS
2.5000 mg | ORAL_TABLET | Freq: Two times a day (BID) | ORAL | Status: DC
  Administered 2022-07-27 – 2022-07-28 (×3): 2.5 mg via ORAL
  Filled 2022-07-27 (×3): qty 1

## 2022-07-27 MED ORDER — METOPROLOL SUCCINATE ER 25 MG PO TB24
12.5000 mg | ORAL_TABLET | Freq: Every day | ORAL | Status: DC
  Administered 2022-07-27: 12.5 mg via ORAL
  Filled 2022-07-27 (×2): qty 1

## 2022-07-27 MED ORDER — ATORVASTATIN CALCIUM 40 MG PO TABS
80.0000 mg | ORAL_TABLET | Freq: Every day | ORAL | Status: AC
  Administered 2022-07-27: 80 mg via ORAL
  Filled 2022-07-27: qty 2

## 2022-07-27 MED ORDER — ASPIRIN 81 MG PO TBEC
81.0000 mg | DELAYED_RELEASE_TABLET | Freq: Every day | ORAL | Status: DC
  Administered 2022-07-28: 81 mg via ORAL
  Filled 2022-07-27: qty 1

## 2022-07-27 MED ORDER — ACETAMINOPHEN 325 MG PO TABS
650.0000 mg | ORAL_TABLET | ORAL | Status: DC | PRN
  Administered 2022-07-27: 650 mg via ORAL
  Filled 2022-07-27: qty 2

## 2022-07-27 MED ORDER — HEPARIN (PORCINE) 25000 UT/250ML-% IV SOLN
1450.0000 [IU]/h | INTRAVENOUS | Status: DC
  Administered 2022-07-27: 1300 [IU]/h via INTRAVENOUS
  Filled 2022-07-27: qty 250

## 2022-07-27 MED ORDER — ALUM & MAG HYDROXIDE-SIMETH 200-200-20 MG/5ML PO SUSP
30.0000 mL | Freq: Once | ORAL | Status: AC
  Administered 2022-07-27: 30 mL via ORAL
  Filled 2022-07-27: qty 30

## 2022-07-27 NOTE — Progress Notes (Signed)
Patient scheduled for coronary CTA tomorrow morning (12-5 at 7:30 am) at [facility].   Per navigator for cardiac imaging, Wynelle Bourgeois., RN, HR needs to be around 55-60 bpm -- beta blocker should be administered at 5:30 am, prior to scan.  Patient needs to be at [facility] by 7am, [facility] called by and confirmed round trip by Wynelle Bourgeois, RN.  Patient needs 18 PIV in [facility].  Patient should not have caffeine for 12 hours before scan.  Call Alford Highland., RN with questions/concerns. Number in care order.

## 2022-07-27 NOTE — Consult Note (Signed)
Cardiology Consultation   Patient ID: Godwin Tedesco MRN: [id number]; DOB: 1935-03-02  Admit date: 07/26/2022 Date of Consult: 07/27/2022  PCP:  Mast, Man X, NP   [facility] Cardiologist:  Early Osmond, MD      Patient Profile:   Eric Chung is a 87 y.o. male with a hx of DVT and PE on eliquis, GERD, HTN, HLD, OSA, chronic systolic heart failure who is being seen 07/27/2022 for the evaluation of chest pain at the request of Dr. Alfredia Ferguson.  History of Present Illness:   Eric Chung is an 87 year old male with above medical history who is followed by Dr. Ali Lowe.   Per chart review, patient is referred to cardiology in 06/2021 for evaluation of palpitations.  Echocardiogram on 07/30/2021 showed EF 44-31%, grade 1 diastolic dysfunction, normal RV systolic function.  It was noted that there were very frequent PVCs during the study.  Dr. Ali Lowe discussed cardiac catheterization with the patient, however the patient was not interested in invasive assessments (note-- he was also DNR). Instead, he was started on guideline directed medical therapy with losartan, Jardiance, Toprol.  Repeat echocardiogram on 01/05/2022 showed that EF was 35-40% with global hypokinesis, normal RV systolic function.  Patient presented to the ED on 12/3 complaining of intermittent left-sided shoulder/chest pain, diaphoresis, nausea, lightheadedness.  He was treated with sublingual nitroglycerin which improved his chest pain, however he continued to have pain with deep inspiration.  Labs in the ED showed Na 139, K 4.2, creatinine 1.25, WBC 11.0, hemoglobin 15.1, platelets 241. hsTn 30>>29. CXR showed no active disease. CTA chest showed cardiomegaly withoug evidence of CHF, bronchitis with mosaic lung attenuation consistent with small airway disease. Also noted trace three-vessel calcific CAD.  EKG showed sinus rhythm with PVCs, right bundle branch block and left anterior fascicular block present    On interview, patient reports that he was sitting down to watch TV yesterday when he started feeling left shoulder pain.  The shoulder pain spread, and eventually he had pain throughout his chest and his back.  He was given a dose of morphine with dramatic improvement in chest pain, however the pain has returned.  Pain is waxing and waning.  There is dull tightness and intense episodes of sharp/stabbing pain.  Pain is worse when patient tries to take a deep breath.  He denies fever, chills, body aches.  Denies having chest pain on exertion recently.  Denies having chest pain prior to yesterday evening.  He has felt occasional nausea and diaphoresis.  Past Medical History:  Diagnosis Date   Apnea, sleep    Barrett's esophagus    Gout    Hypertension    Obesity    Pulmonary emboli [facility])     Past Surgical History:  Procedure Laterality Date   CHOLECYSTECTOMY  08/29/2017   EYE SURGERY Bilateral 2011-2012   catracts removed   HERNIA REPAIR  2005   T. Carlton Adam MD   TONSILECTOMY/ADENOIDECTOMY WITH MYRINGOTOMY  1944     Home Medications:  Prior to Admission medications   Medication Sig Start Date End Date Taking? Authorizing Provider  allopurinol (ZYLOPRIM) 100 MG tablet TAKE ONE TABLET BY MOUTH TWICE DAILY Patient taking differently: Take 100 mg by mouth 2 (two) times daily. 07/20/22  Yes Mast, Man X, NP  apixaban (ELIQUIS) 2.5 MG TABS tablet Take 2.5 mg by mouth 2 (two) times a day.  03/08/19  Yes [provider]  cephALEXin (KEFLEX) 500 MG capsule Take 1 capsule (500 mg  total) by mouth 2 (two) times daily for 5 days. 07/24/22 07/29/22 Yes Sherwood Gambler, MD  empagliflozin (JARDIANCE) 10 MG TABS tablet Take 1 tablet (10 mg total) by mouth daily before breakfast. 12/24/21  Yes Early Osmond, MD  fluticasone (FLONASE) 50 MCG/ACT nasal spray Place 1 spray into both nostrils daily.    Yes [provider]  HYDROcodone-acetaminophen (NORCO/VICODIN) 5-325 MG tablet Take 1-2  tablets by mouth every 6 (six) hours as needed. Patient taking differently: Take 1-2 tablets by mouth every 6 (six) hours as needed for moderate pain. 05/23/22  Yes Regan Lemming, MD  ibuprofen (ADVIL) 200 MG tablet Take 200-400 mg by mouth every 8 (eight) hours as needed for mild pain.   Yes [provider]  latanoprost (XALATAN) 0.005 % ophthalmic solution Place 1 drop into both eyes at bedtime. 07/05/17  Yes [provider]  losartan (COZAAR) 25 MG tablet Take 1 tablet (25 mg total) by mouth daily. Patient taking differently: Take 25 mg by mouth at bedtime. 10/06/21  Yes Early Osmond, MD  metoprolol succinate (TOPROL XL) 25 MG 24 hr tablet Take 0.5 tablets (12.5 mg total) by mouth daily. Patient taking differently: Take 12.5 mg by mouth at bedtime. 10/06/21  Yes Early Osmond, MD  Multiple Vitamins-Minerals (MULTIVITAMIN WITH MINERALS) tablet Take 1 tablet by mouth daily.   Yes [provider]  omeprazole (PRILOSEC) 20 MG capsule TAKE ONE CAPSULE BY MOUTH ONCE DAILY. Patient taking differently: Take 20 mg by mouth daily. 06/02/22  Yes Mast, Man X, NP  ondansetron (ZOFRAN-ODT) 4 MG disintegrating tablet Take 1 tablet (4 mg total) by mouth every 8 (eight) hours as needed for nausea or vomiting. 07/24/22  Yes Sherwood Gambler, MD  SUMAtriptan (IMITREX) 50 MG tablet TAKE 1 TABLET AS NEEDED FOR MIGRAINE. Patient taking differently: Take 50 mg by mouth every 2 (two) hours as needed for migraine. 06/09/22  Yes Mast, Man X, NP    Inpatient Medications: Scheduled Meds:  allopurinol  100 mg Oral BID   aspirin EC  325 mg Oral Daily   empagliflozin  10 mg Oral QAC breakfast   latanoprost  1 drop Both Eyes QHS   losartan  25 mg Oral Daily   pantoprazole  40 mg Oral Daily   Continuous Infusions:  heparin 1,300 Units/hr (07/27/22 0113)   PRN Meds: acetaminophen, nitroGLYCERIN, ondansetron (ZOFRAN) IV  Allergies:    Allergies  Allergen Reactions   Penicillins      Has patient had a PCN reaction causing immediate rash, facial/tongue/throat swelling, SOB or lightheadedness with hypotension: yes, rash Has patient had a PCN reaction causing severe rash involving mucus membranes or skin necrosis: no Has patient had a PCN reaction that required hospitalization: no Has patient had a PCN reaction occurring within the last 10 years: No If all of the above answers are "NO", then may proceed with Cephalosporin use.     Social History:   Social History   Socioeconomic History   Marital status: Married    Spouse name: Inez Catalina   Number of children: 1   Years of education: Not on file   Highest education level: Not on file  Occupational History   Not on file  Tobacco Use   Smoking status: Former    Packs/day: 1.00    Years: 15.00    Total pack years: 15.00    Types: Cigarettes    Quit date: 08/24/1984    Years since quitting: 37.9   Smokeless tobacco: Never  Vaping Use   Vaping Use: Never used  Substance and Sexual Activity   Alcohol use: Yes    Alcohol/week: 3.0 - 4.0 standard drinks of alcohol    Types: 3 - 4 Standard drinks or equivalent per week    Comment: 2-3 beers a month   Drug use: No   Sexual activity: Not Currently  Other Topics Concern   Not on file  Social History Narrative   Social History     Marital status: Married ,1986            Spouse name: Inez Catalina                    Years of education:                 Number of children: 1                Occupational History: Pharmacist, hospital     None on file      Social History Main Topics     Smoking status:                       Smokeless tobacco: Not on file                        Alcohol use: 3-4 drinks per week     Drug use: Unknown     Sexual activity: Not on file            Other Topics            Concern     None on file      Social History Narrative     None on file            Social Determinants of Health   Financial Resource Strain: Low Risk  (07/11/2018)   Overall Financial  Resource Strain (CARDIA)    Difficulty of Paying Living Expenses: Not hard at all  Food Insecurity: No Food Insecurity (07/11/2018)   Hunger Vital Sign    Worried About Running Out of Food in the Last Year: Never true    Ran Out of Food in the Last Year: Never true  Transportation Needs: No Transportation Needs (07/11/2018)   PRAPARE - Hydrologist (Medical): No    Lack of Transportation (Non-Medical): No  Physical Activity: Inactive (07/11/2018)   Exercise Vital Sign    Days of Exercise per Week: 0 days    Minutes of Exercise per Session: 0 min  Stress: No Stress Concern Present (07/11/2018)   [place]    Feeling of Stress : Only a little  Social Connections: Socially Integrated (07/11/2018)   Social Connection and Isolation Panel [NHANES]    Frequency of Communication with Friends and Family: More than three times a week    Frequency of Social Gatherings with Friends and Family: More than three times a week    Attends Religious Services: More than 4 times per year    Active Member of [employer] or Organizations: Yes    Attends Archivist Meetings: More than 4 times per year    Marital Status: Married  Human resources officer Violence: Not At Risk (07/11/2018)   Humiliation, Afraid, Rape, and Kick questionnaire    Fear of Current or Ex-Partner: No    Emotionally Abused: No    Physically Abused: No  Sexually Abused: No    Family History:    Family History  Problem Relation Age of Onset   Osteoporosis Mother    Cancer Father 54       esophagus   Crohn's disease Sister    Birth defects Maternal Grandmother        colon   Diabetes Paternal Grandmother      ROS:  Please see the history of present illness.   All other ROS reviewed and negative.     Physical Exam/Data:   Vitals:   07/27/22 0600 07/27/22 0630 07/27/22 0704 07/27/22 0716  BP: (!) 105/92 111/66 106/66 106/66   Pulse: 93 79 87 84  Resp: 16 (!) _0 Temp:    98.3 F (36.8 C)  TempSrc:    Oral  SpO2: 93% 93% 93% 94%   No intake or output data in the 24 hours ending 07/27/22 0747    07/24/2022    8:20 AM 05/04/2022    2:46 PM 03/26/2022    2:10 PM  Last 3 Weights  Weight (lbs) 231 lb 231 lb 231 lb  Weight (kg) 104.781 kg 104.781 kg 104.781 kg     There is no height or weight on file to calculate BMI.  General:  Well nourished, well developed.  Laying flat in the bed.  Appears uncomfortable, but in no acute distress HEENT: normal Neck: no JVD Vascular: Radial pulses 2+ bilaterally Cardiac:  normal S1, S2; RRR; no murmur. Chest wall is tender to palpation  Lungs:  fine crackles throughout. Complains of intense sharp chest pain on deep inhalation  Abd: soft, tender to palpation in epigastric region  Ext: no edema Musculoskeletal:  No deformities, BUE and BLE strength normal and equal Skin: warm and dry  Neuro:  CNs 2-12 intact, no focal abnormalities noted Psych:  Normal affect   EKG:  The EKG was personally reviewed and demonstrates:  sinus rhythm with PVCs, right bundle branch block and left anterior fascicular block present  Telemetry:  Telemetry was personally reviewed and demonstrates:  NSR  Relevant CV Studies:  Echocardiogram 01/05/2022  1. Left ventricular ejection fraction, by estimation, is 35 to 40%. The  left ventricle has moderately decreased function. The left ventricle  demonstrates global hypokinesis. Indeterminate diastolic filling due to  E-A fusion.   2. Right ventricular systolic function is normal. The right ventricular  size is mildly enlarged. Tricuspid regurgitation signal is inadequate for  assessing PA pressure.   3. Left atrial size was mildly dilated.   4. The mitral valve is grossly normal. Trivial mitral valve  regurgitation. No evidence of mitral stenosis.   5. The aortic valve is tricuspid. Aortic valve regurgitation is mild. No  aortic stenosis is  present.   6. The inferior vena cava is normal in size with greater than 50%  respiratory variability, suggesting right atrial pressure of 3 mmHg.   Comparison(s): No significant change from prior study.    Laboratory Data:  High Sensitivity Troponin:   Recent Labs  Lab 07/26/22 2122 07/26/22 2245  TROPONINIHS 30* 29*     Chemistry Recent Labs  Lab 07/24/22 0927 07/26/22 2122 07/27/22 0445  NA 138 139 136  K 4.1 4.2 4.5  CL 106 109 107  CO2 _1 GLUCOSE 112* 130* 146*  BUN _2 CREATININE 1.29* 1.25* 1.12  CALCIUM 9.1 8.8* 8.6*  GFRNONAA 54* 56* >60  ANIONGAP _3 No results for  input(s): "PROT", "ALBUMIN", "AST", "ALT", "ALKPHOS", "BILITOT" in the last 168 hours. Lipids No results for input(s): "CHOL", "TRIG", "HDL", "LABVLDL", "LDLCALC", "CHOLHDL" in the last 168 hours.  Hematology Recent Labs  Lab 07/24/22 0927 07/26/22 2122 07/27/22 0445  WBC 6.8 11.0* 9.7  RBC 5.02 4.89 4.67  HGB 15.3 15.1 14.3  HCT 46.7 46.3 44.4  MCV 93.0 94.7 95.1  MCH 30.5 30.9 30.6  MCHC 32.8 32.6 32.2  RDW 15.0 14.6 14.8  PLT 235 241 212   Thyroid No results for input(s): "TSH", "FREET4" in the last 168 hours.  BNPNo results for input(s): "BNP", "PROBNP" in the last 168 hours.  DDimer No results for input(s): "DDIMER" in the last 168 hours.   Radiology/Studies:  CT Angio Chest/Abd/Pel for Dissection W and/or Wo Contrast  Result Date: 07/26/2022 CLINICAL DATA:  Left chest and shoulder pain with diaphoresis. Acute aortic syndrome suspected. EXAM: CT ANGIOGRAPHY CHEST, ABDOMEN AND PELVIS TECHNIQUE: Noncontrast chest CT at 5 mm axial intervals to the adrenal glands was performed initially to assess for aortic hematoma. Multidetector CT imaging through the chest, abdomen and pelvis was performed using the standard protocol during bolus administration of intravenous contrast. Multiplanar reconstructed images and MIPs were obtained and reviewed to evaluate the vascular  anatomy. RADIATION DOSE REDUCTION: This exam was performed according to the departmental dose-optimization program which includes automated exposure control, adjustment of the mA and/or kV according to patient size and/or use of iterative reconstruction technique. CONTRAST:  139m OMNIPAQUE IOHEXOL 350 MG/ML SOLN COMPARISON:  Portable chest today, portable chest 05/23/2022, CT abdomen and pelvis no contrast recently 07/24/2022, CT abdomen and pelvis with contrast 08/27/2017. FINDINGS: CTA CHEST FINDINGS Cardiovascular: There is mild cardiomegaly with a left chamber predominance and trace three-vessel calcific CAD. No pericardial effusion. Veins are normal in caliber. Pulmonary arteries are upper-normal caliber with no central embolus. There is mild mixed calcific and soft plaque in the aortic arch and descending segment with mild descending tortuosity. The great vessels demonstrate minimal calcific plaque but are widely patent. There is no aortic aneurysm, dissection or stenosis. Mediastinum/Nodes: No enlarged mediastinal, hilar, or axillary lymph nodes. Thyroid gland, trachea, and esophagus demonstrate no significant findings. There is mild mediastinal lipomatosis and a small hiatal hernia. Lungs/Pleura: There is respiratory motion limiting evaluation of the lung fields. There is a low inspiration on exam. There is diffuse bronchial thickening. Posterior atelectasis noted in the lower lobes and diffuse mosaic lung attenuation consistent with small airways disease. No confluent pneumonia is seen or appreciable nodules. There is no pleural effusion, thickening or pneumothorax. Musculoskeletal: There is osteopenia with degenerative changes of the thoracic spine. Mild thoracic dextroscoliosis. Healed fracture deformity proximal right humerus and bilateral mild shoulder DJD. The ribcage is intact. No chest wall mass is seen. Review of the MIP images confirms the above findings. CTA ABDOMEN AND PELVIS FINDINGS VASCULAR  Aorta: There are moderate mixed plaques without dissection, stenosis or aneurysm. Celiac: There is a 50% calcific stenosis in the proximal 6 mm of the vessel with mild fusiform poststenotic ectasia, patchy calcification in the splenic artery without visible branch vessel stenosis. There is a normal variant left gastric artery origin from the abdominal aorta just above the celiac origin which has a 75% soft plaque origin stenosis and otherwise opacifies well. SMA: There is moderate mixed plaque up to and just distal to the inflection point of the vessel, and 3 cm distal to the vessel origin there is soft plaque causing a 60% focal stenosis. The  SMA otherwise without flow-limiting stenosis, and no aneurysm or dissection. Renals: There are mild calcific plaques in the proximal renal arteries but no flow-limiting stenosis, aneurysm or dissection. On the left there is early takeoff of a hypoplastic upper pole artery without stenosis. IMA: 80% or greater calcific origin stenosis. Otherwise the vessel opacifies well. Inflow: There are moderate patchy calcific/mixed plaques in the common iliac and internal iliac arteries, without flow-limiting narrowing. Both external iliac arteries show only minimal calcific plaque and no stenosis, aneurysm or dissection. The proximal outflow vessels are widely patent with mild calcifications in the common femoral arteries. Veins: Unopacified and not evaluated. The main portal vein and IVC are within normal caliber limits. Review of the MIP images confirms the above findings. NON-VASCULAR Hepatobiliary: 19 cm in length liver with mild general fatty replacement. There are scattered cysts, largest in the left lobe is 2.5 cm, largest on the right is in the anterior segment measuring 2 cm. Gallbladder is absent without biliary prominence. Pancreas: Partially atrophic, otherwise unremarkable. Spleen: No mass enhancement or splenomegaly. Adrenals/Urinary Tract: 4 mm and 3 mm nonobstructing  caliceal stones are noted in the upper and lower right renal collecting system. Bilateral small parapelvic cysts and bilateral cortical renal cysts are again shown, largest are in the right kidney measuring 5.4 cm laterally in the midpole and 3.2 cm in the lower pole. Cysts on the left are smaller, largest is a 2 cm parapelvic cyst. No mass enhancement is seen, no ureteral stones or hydronephrosis and no adrenal mass. The bladder is unremarkable for the degree of distention. Stomach/Bowel: No dilatation or wall thickening. An appendix is not seen in this patient. There is diffuse diverticulosis. No evidence of colitis or diverticulitis. Lymphatic: No adenopathy is seen. Reproductive: Prostatomegaly with transverse diameter 5.2 cm. Other: There is no free air, free hemorrhage, free fluid or acute inflammatory changes. Small supraumbilical, umbilical and inguinal fat hernias are noted without incarcerated hernia. Musculoskeletal: There is osteopenia with degenerative changes in the lumbar spine. Severe acquired spinal stenosis L4-5, moderate acquired spinal stenosis L2-3 and L3-4. No acute or other significant osseous findings. Review of the MIP images confirms the above findings. IMPRESSION: 1. Aortic and coronary artery atherosclerosis without aneurysm, dissection or stenosis. 2. Cardiomegaly without evidence of CHF. 3. Upper-normal pulmonary arteries without evidence of central embolus. 4. Bronchitis with mosaic lung attenuation consistent with small airways disease. 5. Small hiatal hernia. 6. Fatty liver with scattered cysts. 7. Nonobstructive nephrolithiasis. 8. Bilateral renal cysts. 9. Diverticulosis without evidence of diverticulitis. 10. Prostatomegaly. 11. Umbilical, supraumbilical and inguinal fat hernias. 12. Osteopenia and degenerative change with multilevel acquired spinal stenosis most severe at L4-5. Electronically Signed   By: Telford Nab M.D.   On: 07/26/2022 23:46   DG Chest Port 1  View  Result Date: 07/26/2022 CLINICAL DATA:  Chest pain EXAM: PORTABLE CHEST 1 VIEW COMPARISON:  Chest x-ray 05/23/2022 FINDINGS: Heart is enlarged, unchanged. There is no focal lung infiltrate, pleural effusion or pneumothorax. No acute fractures are seen. IMPRESSION: No active disease. Electronically Signed   By: Ronney Asters M.D.   On: 07/26/2022 21:19   CT Renal Stone Study  Result Date: 07/24/2022 CLINICAL DATA:  Flank pain.  Kidney stone suspected EXAM: CT ABDOMEN AND PELVIS WITHOUT CONTRAST TECHNIQUE: Multidetector CT imaging of the abdomen and pelvis was performed following the standard protocol without IV contrast. RADIATION DOSE REDUCTION: This exam was performed according to the departmental dose-optimization program which includes automated exposure control, adjustment of the  mA and/or kV according to patient size and/or use of iterative reconstruction technique. COMPARISON:  None Available. FINDINGS: Lower chest: Lung bases are clear. Hepatobiliary: Several simple fluid attenuation cystic lesions in the liver. Postcholecystectomy. No biliary duct dilatation. Common bile duct is normal. Pancreas: Pancreas is normal. No ductal dilatation. No pancreatic inflammation. Spleen: Normal spleen Adrenals/urinary tract: Adrenal glands normal. Nonobstructing calculus in the upper pole of the RIGHT kidney measures 5 mm. 3 mm calculus in lower pole of the RIGHT kidney. No ureterolithiasis or obstructive uropathy. Multiple bilateral renal cystic lesions which have Hounsfield units less than 20 consistent with simple fluid. No bladder calculi Stomach/Bowel: The stomach, duodenum, and small bowel normal. Diverticulosis of the descending aorta acute diverticulitis. Vascular/Lymphatic: Abdominal aorta is normal caliber with atherosclerotic calcification. There is no retroperitoneal or periportal lymphadenopathy. No pelvic lymphadenopathy. Reproductive: Prostate unremarkable Other: No free fluid. Musculoskeletal:  Hemangioma within the L5 vertebral body IMPRESSION: 1. Two nonobstructing RIGHT renal calculi. No ureterolithiasis or obstructive uropathy. 2. No bladder calculi. 3. Multiple lesions right Bosniak II benign renal cyst. No follow-up imaging is recommended. JACR 2018 Feb; 264-273, Management of the Incidental Renal Mass on CT, [employer] 2021; 814-848, Bosniak Classification of Cystic Renal Masses, Version 2019. 4. Benign-appearing hepatic cysts. Electronically Signed   By: Suzy Bouchard M.D.   On: 07/24/2022 10:02     Assessment and Plan:   Chest pain - Patient presented complaining of left-sided shoulder/chest pain.  Pain is waxing and waning, occasionally a dull tightness with episodes of intense, sharp pain.  Pain is worse with deep inhalation.  Chest wall tender to palpation - hsTn 30>>29. Ordered a third troponin to be drawn this AM  -CTA chest showed bronchitis with mosaic lung attenuation consistent with small airway disease, no aortic dissection.  Also noted trace three-vessel calcific CAD - Patient has a history of reduced EF and has never had an ischemic evaluation.  Now with chest pain, mild troponin elevation.  Ordered coronary CT to rule out obstructive CAD - Chest pain concerning for possible pericarditis.  Ordered ESR, CRP.  Start colchicine 0.6 mg twice daily - Ordered echocardiogram -Patient was started on IV heparin in the ER.  Okay to continue this until third troponin is resulted.  Chronic systolic heart failure - Most recent echocardiogram from 01/05/2022 showed EF 35-40%.  Patient had a nuclear stress test several years ago prior to hernia surgery.  Has not had an ischemic evaluation for reduced EF - Coronary CT as above - Patient is euvolemic on exam -Continue Jardiance, losartan, metoprolol -Consider adding spironolactone, transition to Entresto if BP tolerates  History of DVT, PE -Patient was on Eliquis prior to admission.  Eliquis held as patient is on IV  heparin   Risk Assessment/Risk Scores:    New York Heart Association (NYHA) Functional Class NYHA Class III        For questions or updates, please contact [place] Please consult [email or web address] for contact info under    Signed, Margie Billet, PA-C  07/27/2022 7:47 AM

## 2022-07-27 NOTE — Progress Notes (Signed)
CXR 07/26/22- Impression= No active disease. Will issue Flutter when indication is present (originally ordered PRN). RT acknowledging order.

## 2022-07-27 NOTE — Progress Notes (Addendum)
The patient is a 87 year old obese Caucasian male with a past medical history significant for but not limited to cardiomyopathy with an EF of 35 to 40% with left ventricular global hypokinesis with an echo done in May 2023 as a has a history of remote polysubstance abuse and other cardiac risk factors including but not limited to hypertension, untreated sleep apnea as well as a history of PE and DVT.  Yesterday evening after supper he developed severe left-sided chest and shoulder pain.  The pain continued to worsen and became different and moved all across his torso.  He felt like it was a "tightening like a belt "on his chest and it kept getting worse.  He went downstairs to [place] the pain continued to increase.  He was slept okay but then woke up this morning and the pain worsened so he subsequently drove himself to the ED for further evaluation.  He describes the pain as sharp and constant and states he is never had This before.  He states this is not feeling like the same type of pain that he has when he has heartburn.  He also noted that activity makes the pain worse and movement does not really affect the pain.  In the ED he was given 3 sublingual nitroglycerin and use nitroglycerin tablet incrementally decrease the pain and on presentation it was a 10 out of 10 and then at the time of evaluation by the admitting physician was a 3 out of 10.  After the nitroglycerin his blood pressure dropped to the 80s though and troponins were flat at 29 and 30.  He had an abnormal EKG but no real acute ischemic changes.  Cardiology was consulted for further evaluation recommendations.  Cardiology felt that his pain was consistent with a pleuritic chest pain however could also have some pericarditis.  They recommended obtaining a coronary CT as well as an echocardiogram and starting the patient on NSAIDs and colchicine likely.  Currently he is being admitted and treated for the following but not limited to:  Chest pain  rule out ACS likely pleuritic however has a component of pericarditis likely -He is being admitted to the stepdown unit as observation -EDP spoke with the cardiology fellow who recommended anticoagulation keeping the patient at length along with cardiology consult in the morning -Patient is normally anticoagulated Eliquis for history of PE and DVT and this was discontinued and heparin drip was initiated -Patient was initiated on aspirin, and high-dose statin and lipid panel is being ordered -He had borderline hypotension so his ACE and beta-blocker were held initially but has now been resumed on his losartan 25 mg p.o. daily -Given gentle IV fluid normal saline at 75 mL/h and 500 mL bolus -His SGLT2 inhibitor has been continued and an echocardiogram is being ordered -Cardiology has been officially consulted and patient underwent a CTA of the chest was negative for PE, dissection or pneumonia however did show that he has aortic and coronary artery atherosclerosis without aneurysm, dissection or stenosis and he does have cardiomegaly without evidence of CHF.  The upper lumbar pulmonary arteries were without evidence of central embolus and he does have a bronchitis with a mosaic lung attenuation consistent with small airway disease.  Patient was also noted to have a hiatal hernia of a small as well as fatty liver with scattered cysts and nonobstructive nephrolithiasis, bilateral renal cysts as well as diverticulosis without diverticulitis.  Patient was noted to also have prostatic megaly, umbilical and supraumbilical and inguinal  fat hernias as well as osteopenia and degenerative changes with multilevel acquired spinal stenosis which is most severe at L4-L5 -Cardiology is evaluating and recommending obtaining a coronary CT and will consider starting NSAIDs as well as colchicine for suspected pericarditis  Acute respiratory failure with hypoxia -Likely in the setting of his acute bronchitis -Continue  supplemental oxygen via nasal cannula wean O2 as tolerated -Continuous pulse oximetry maintain O2 saturation greater than 90% -SpO2: 94 % O2 Flow Rate (L/min): 2 L/min -Continue supportive care and repeat chest x-ray in the a.m. -Will add Xopenex and Atrovent -We will add guaifenesin 1200 mg p.o. 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  GERD/Barrett's esophagus -Continue with PPI  Obesity -Complicates overall prognosis and care -Estimated body mass index is 32.22 kg/m as calculated from the following:   Height as of 07/24/22: '5\' 11"'$  (1.803 m).   Weight as of 07/24/22: 104.8 kg.  -Weight Loss and Dietary Counseling given  Will continue to monitor the patient's clinical response to intervention and repeat blood work and imaging in the a.m. and follow-up on specialist recommendations and appreciate their evaluation.  Cardiology is ordering a coronary CTA as well as an echocardiogram and will likely add NSAIDs and colchicine for suspected pericarditis

## 2022-07-27 NOTE — Progress Notes (Signed)
ANTICOAGULATION CONSULT NOTE - Initial Consult  Pharmacy Consult for heparin Indication: chest pain/ACS  Allergies  Allergen Reactions   Penicillins     Has patient had a PCN reaction causing immediate rash, facial/tongue/throat swelling, SOB or lightheadedness with hypotension: yes, rash Has patient had a PCN reaction causing severe rash involving mucus membranes or skin necrosis: no Has patient had a PCN reaction that required hospitalization: no Has patient had a PCN reaction occurring within the last 10 years: No If all of the above answers are "NO", then may proceed with Cephalosporin use.     Patient Measurements:   Heparin Dosing Weight: 104kg  Vital Signs: Temp: 98.2 F (36.8 C) (12/03 2057) BP: 98/59 (12/03 2350) Pulse Rate: 86 (12/03 2350)  Labs: Recent Labs    07/24/22 0927 07/26/22 2122 07/26/22 2245  HGB 15.3 15.1  --   HCT 46.7 46.3  --   PLT 235 241  --   CREATININE 1.29* 1.25*  --   TROPONINIHS  --  30* 29*    Estimated Creatinine Clearance: 53.2 mL/min (A) (by C-G formula based on SCr of 1.25 mg/dL (H)).   Medical History: Past Medical History:  Diagnosis Date   Apnea, sleep    Barrett's esophagus    Gout    Hypertension    Obesity    Pulmonary emboli [facility])      Assessment:  87 y.o. male history of DVT and PE, on Eliquis, hypertension, presenting to the ED with complaint of chest pain. He has felt diaphoretic, nauseated, and the symptoms are worse with exertion.  Pharmacy consulted to dose heparin drip for ACS/STEMI.  LD eliquis 12/3 @ 0800 CBC WNL, Scr 1.25 Baseline labs ordered STAT  Goal of Therapy:  Heparin level 0.3-0.7 units/ml aPTT 66-102 seconds Monitor platelets by anticoagulation protocol: Yes   Plan:  Heparin drip 1300 units/hr (no bolus with apixaban on board) aPTT in 8 hours, will use aPTT unitl the effects of apixaban diminish Daily CBC   Dolly Rias RPh 07/27/2022, 12:17 AM

## 2022-07-27 NOTE — H&P (Addendum)
PCP:   Mast, Man X, NP   Chief Complaint: Chest pain   HPI:*This is a with 87 year old male with past medical history of cardiomyopathy EF 35-40% with left ventricular global hypokinesis (Echo done 01/05/2022).  He also has remote history of polysubstance abuse.  Other cardiac risk factors hypertension, untreated sleep apnea.  Patient also has a history of PE/DVT.  Today after supper he developed left-sided shoulder pain.  The pain continued to worsen, became different and moved all across his torso.  It was tightening it felt like a belt around his chest and it kept getting worse.  He went to walk his wife to her room, as he walked his chest pain became much worse.  He went downstairs to rest.  The pain continued to increase and he decided to drive himself to the ER.  As he drove the pain became much worse, he developed shortness of breath.  He described the pain as sharp and constant.  He has never had anything like this before.  He states is not like a heartburn.  He states activity made the pain worse and movement does not affect the pain.  In the ER he was given 3 sublingual nitroglycerin, each nitroglycerin incrementally decreased the pain.  On presentation the chest pain was 10/10 currently the pain is 3/10.  However, the third nitroglycerin dropped his blood cell blood pressure to 80s.  Troponins flat at 29 and 30.  Abnormal EKG but no acute ischemic changes.  Review of Systems:  The patient denies anorexia, fever, weight loss,, vision loss, decreased hearing, hoarseness, chest pain, syncope, dyspnea on exertion, peripheral edema, balance deficits, hemoptysis, abdominal pain, melena, hematochezia, severe indigestion/heartburn, hematuria, incontinence, genital sores, muscle weakness, suspicious skin lesions, transient blindness, difficulty walking, depression, unusual weight change, abnormal bleeding, enlarged lymph nodes, angioedema, and breast masses. History of: Chest pain, shortness of  breath,  Past Medical History: Past Medical History:  Diagnosis Date   Apnea, sleep    Barrett's esophagus    Gout    Hypertension    Obesity    Pulmonary emboli [facility])    Past Surgical History:  Procedure Laterality Date   CHOLECYSTECTOMY  08/29/2017   EYE SURGERY Bilateral 2011-2012   catracts removed   HERNIA REPAIR  2005   T. Carlton Adam MD   TONSILECTOMY/ADENOIDECTOMY WITH MYRINGOTOMY  1944    Medications: Prior to Admission medications   Medication Sig Start Date End Date Taking? Authorizing Provider  allopurinol (ZYLOPRIM) 100 MG tablet TAKE ONE TABLET BY MOUTH TWICE DAILY 07/20/22   Mast, Man X, NP  apixaban (ELIQUIS) 2.5 MG TABS tablet Take 2.5 mg by mouth 2 (two) times a day.  03/08/19   [provider]  cephALEXin (KEFLEX) 500 MG capsule Take 1 capsule (500 mg total) by mouth 2 (two) times daily for 5 days. 07/24/22 07/29/22  Sherwood Gambler, MD  empagliflozin (JARDIANCE) 10 MG TABS tablet Take 1 tablet (10 mg total) by mouth daily before breakfast. 12/24/21   Early Osmond, MD  fluticasone (FLONASE) 50 MCG/ACT nasal spray Place 1 spray into both nostrils daily.     [provider]  HYDROcodone-acetaminophen (NORCO/VICODIN) 5-325 MG tablet Take 1-2 tablets by mouth every 6 (six) hours as needed. 05/23/22   Regan Lemming, MD  ibuprofen (ADVIL) 200 MG tablet Take 200 mg by mouth as needed.    [provider]  latanoprost (XALATAN) 0.005 % ophthalmic solution Place 1 drop into both eyes at bedtime. 07/05/17   [provider]  losartan (COZAAR) 25 MG tablet Take 1 tablet (25 mg total) by mouth daily. 10/06/21   Early Osmond, MD  metoprolol succinate (TOPROL XL) 25 MG 24 hr tablet Take 0.5 tablets (12.5 mg total) by mouth daily. 10/06/21   Early Osmond, MD  Multiple Vitamins-Minerals (MULTIVITAMIN WITH MINERALS) tablet Take 1 tablet by mouth daily.    [provider]  omeprazole (PRILOSEC) 20 MG capsule TAKE ONE CAPSULE BY MOUTH  ONCE DAILY. 06/02/22   Mast, Man X, NP  ondansetron (ZOFRAN-ODT) 4 MG disintegrating tablet Take 1 tablet (4 mg total) by mouth every 8 (eight) hours as needed for nausea or vomiting. 07/24/22   Sherwood Gambler, MD  SUMAtriptan (IMITREX) 50 MG tablet TAKE 1 TABLET AS NEEDED FOR MIGRAINE. 06/09/22   Mast, Man X, NP    Allergies:   Allergies  Allergen Reactions   Penicillins     Has patient had a PCN reaction causing immediate rash, facial/tongue/throat swelling, SOB or lightheadedness with hypotension: yes, rash Has patient had a PCN reaction causing severe rash involving mucus membranes or skin necrosis: no Has patient had a PCN reaction that required hospitalization: no Has patient had a PCN reaction occurring within the last 10 years: No If all of the above answers are "NO", then may proceed with Cephalosporin use.     Social History:  reports that he quit smoking about 37 years ago. His smoking use included cigarettes. He has a 15.00 pack-year smoking history. He has never used smokeless tobacco. He reports current alcohol use of about 3.0 - 4.0 standard drinks of alcohol per week. He reports that he does not use drugs.  Family History: Family History  Problem Relation Age of Onset   Osteoporosis Mother    Cancer Father 93       esophagus   Crohn's disease Sister    Birth defects Maternal Grandmother        colon   Diabetes Paternal Grandmother     Physical Exam: Vitals:   07/26/22 2300 07/26/22 2330 07/26/22 2340 07/26/22 2350  BP: (!) 113/59 126/67 102/66 (!) 98/59  Pulse: 77 73 75 86  Resp: 16 (!) 24 (!) 24 16  Temp:      SpO2: 93% 94% 95% 90%    General:  Alert and oriented times three, well developed and nourished, no acute distress Eyes: PERRLA, pink conjunctiva, no scleral icterus ENT: Moist oral mucosa, neck supple, no thyromegaly Lungs: clear to ascultation, no wheeze, no crackles, no use of accessory muscles Cardiovascular: regular rate and rhythm, no  regurgitation, no gallops, no murmurs. No carotid bruits, no JVD.  No reproducible chest wall. Abdomen: soft, positive BS, non-tender, non-distended, no organomegaly, not an acute abdomen GU: not examined Neuro: CN II - XII grossly intact, sensation intact Musculoskeletal: strength 5/5 all extremities, no clubbing, cyanosis or edema Skin: no rash, no subcutaneous crepitation, no decubitus Psych: appropriate patient   Labs on Admission:  Recent Labs    07/24/22 0927 07/26/22 2122  NA 138 139  K 4.1 4.2  CL 106 109  CO2 23 23  GLUCOSE 112* 130*  BUN 15 16  CREATININE 1.29* 1.25*  CALCIUM 9.1 8.8*    Recent Labs    07/24/22 0927 07/26/22 2122  WBC 6.8 11.0*  NEUTROABS 3.4  --   HGB 15.3 15.1  HCT 46.7 46.3  MCV 93.0 94.7  PLT 235 241     Micro Results: Recent Results (from the past 240  hour(s))  Urine Culture     Status: None   Collection Time: 07/24/22  8:22 AM   Specimen: Urine, Clean Catch  Result Value Ref Range Status   Specimen Description   Final    URINE, CLEAN CATCH Performed at [facility], [address] [place], [place] [address]    Special Requests   Final    NONE Performed at [facility], [address], [place], [place] [address]    Culture   Final    NO GROWTH Performed at [facility], [place] [address], [place], [place] [address]    Report Status 07/25/2022 FINAL  Final     Radiological Exams on Admission: CT Angio Chest/Abd/Pel for Dissection W and/or Wo Contrast  Result Date: 07/26/2022 CLINICAL DATA:  Left chest and shoulder pain with diaphoresis. Acute aortic syndrome suspected. EXAM: CT ANGIOGRAPHY CHEST, ABDOMEN AND PELVIS TECHNIQUE: Noncontrast chest CT at 5 mm axial intervals to the adrenal glands was performed initially to assess for aortic hematoma. Multidetector CT imaging through the chest, abdomen and pelvis was performed using the standard protocol during bolus administration of  intravenous contrast. Multiplanar reconstructed images and MIPs were obtained and reviewed to evaluate the vascular anatomy. RADIATION DOSE REDUCTION: This exam was performed according to the departmental dose-optimization program which includes automated exposure control, adjustment of the mA and/or kV according to patient size and/or use of iterative reconstruction technique. CONTRAST:  123m OMNIPAQUE IOHEXOL 350 MG/ML SOLN COMPARISON:  Portable chest today, portable chest 05/23/2022, CT abdomen and pelvis no contrast recently 07/24/2022, CT abdomen and pelvis with contrast 08/27/2017. FINDINGS: CTA CHEST FINDINGS Cardiovascular: There is mild cardiomegaly with a left chamber predominance and trace three-vessel calcific CAD. No pericardial effusion. Veins are normal in caliber. Pulmonary arteries are upper-normal caliber with no central embolus. There is mild mixed calcific and soft plaque in the aortic arch and descending segment with mild descending tortuosity. The great vessels demonstrate minimal calcific plaque but are widely patent. There is no aortic aneurysm, dissection or stenosis. Mediastinum/Nodes: No enlarged mediastinal, hilar, or axillary lymph nodes. Thyroid gland, trachea, and esophagus demonstrate no significant findings. There is mild mediastinal lipomatosis and a small hiatal hernia. Lungs/Pleura: There is respiratory motion limiting evaluation of the lung fields. There is a low inspiration on exam. There is diffuse bronchial thickening. Posterior atelectasis noted in the lower lobes and diffuse mosaic lung attenuation consistent with small airways disease. No confluent pneumonia is seen or appreciable nodules. There is no pleural effusion, thickening or pneumothorax. Musculoskeletal: There is osteopenia with degenerative changes of the thoracic spine. Mild thoracic dextroscoliosis. Healed fracture deformity proximal right humerus and bilateral mild shoulder DJD. The ribcage is intact. No chest  wall mass is seen. Review of the MIP images confirms the above findings. CTA ABDOMEN AND PELVIS FINDINGS VASCULAR Aorta: There are moderate mixed plaques without dissection, stenosis or aneurysm. Celiac: There is a 50% calcific stenosis in the proximal 6 mm of the vessel with mild fusiform poststenotic ectasia, patchy calcification in the splenic artery without visible branch vessel stenosis. There is a normal variant left gastric artery origin from the abdominal aorta just above the celiac origin which has a 75% soft plaque origin stenosis and otherwise opacifies well. SMA: There is moderate mixed plaque up to and just distal to the inflection point of the vessel, and 3 cm distal to the vessel origin there is soft plaque causing a 60% focal stenosis. The SMA otherwise without flow-limiting stenosis, and  no aneurysm or dissection. Renals: There are mild calcific plaques in the proximal renal arteries but no flow-limiting stenosis, aneurysm or dissection. On the left there is early takeoff of a hypoplastic upper pole artery without stenosis. IMA: 80% or greater calcific origin stenosis. Otherwise the vessel opacifies well. Inflow: There are moderate patchy calcific/mixed plaques in the common iliac and internal iliac arteries, without flow-limiting narrowing. Both external iliac arteries show only minimal calcific plaque and no stenosis, aneurysm or dissection. The proximal outflow vessels are widely patent with mild calcifications in the common femoral arteries. Veins: Unopacified and not evaluated. The main portal vein and IVC are within normal caliber limits. Review of the MIP images confirms the above findings. NON-VASCULAR Hepatobiliary: 19 cm in length liver with mild general fatty replacement. There are scattered cysts, largest in the left lobe is 2.5 cm, largest on the right is in the anterior segment measuring 2 cm. Gallbladder is absent without biliary prominence. Pancreas: Partially atrophic, otherwise  unremarkable. Spleen: No mass enhancement or splenomegaly. Adrenals/Urinary Tract: 4 mm and 3 mm nonobstructing caliceal stones are noted in the upper and lower right renal collecting system. Bilateral small parapelvic cysts and bilateral cortical renal cysts are again shown, largest are in the right kidney measuring 5.4 cm laterally in the midpole and 3.2 cm in the lower pole. Cysts on the left are smaller, largest is a 2 cm parapelvic cyst. No mass enhancement is seen, no ureteral stones or hydronephrosis and no adrenal mass. The bladder is unremarkable for the degree of distention. Stomach/Bowel: No dilatation or wall thickening. An appendix is not seen in this patient. There is diffuse diverticulosis. No evidence of colitis or diverticulitis. Lymphatic: No adenopathy is seen. Reproductive: Prostatomegaly with transverse diameter 5.2 cm. Other: There is no free air, free hemorrhage, free fluid or acute inflammatory changes. Small supraumbilical, umbilical and inguinal fat hernias are noted without incarcerated hernia. Musculoskeletal: There is osteopenia with degenerative changes in the lumbar spine. Severe acquired spinal stenosis L4-5, moderate acquired spinal stenosis L2-3 and L3-4. No acute or other significant osseous findings. Review of the MIP images confirms the above findings. IMPRESSION: 1. Aortic and coronary artery atherosclerosis without aneurysm, dissection or stenosis. 2. Cardiomegaly without evidence of CHF. 3. Upper-normal pulmonary arteries without evidence of central embolus. 4. Bronchitis with mosaic lung attenuation consistent with small airways disease. 5. Small hiatal hernia. 6. Fatty liver with scattered cysts. 7. Nonobstructive nephrolithiasis. 8. Bilateral renal cysts. 9. Diverticulosis without evidence of diverticulitis. 10. Prostatomegaly. 11. Umbilical, supraumbilical and inguinal fat hernias. 12. Osteopenia and degenerative change with multilevel acquired spinal stenosis most severe  at L4-5. Electronically Signed   By: Telford Nab M.D.   On: 07/26/2022 23:46   DG Chest Port 1 View  Result Date: 07/26/2022 CLINICAL DATA:  Chest pain EXAM: PORTABLE CHEST 1 VIEW COMPARISON:  Chest x-ray 05/23/2022 FINDINGS: Heart is enlarged, unchanged. There is no focal lung infiltrate, pleural effusion or pneumothorax. No acute fractures are seen. IMPRESSION: No active disease. Electronically Signed   By: Ronney Asters M.D.   On: 07/26/2022 21:19    Echo 01/05/22  FINDINGS   Left Ventricle: Left ventricular ejection fraction, by estimation, is 35  to 40%. The left ventricle has moderately decreased function. The left  ventricle demonstrates global hypokinesis. 3D left ventricular ejection  fraction analysis performed but not  reported based on interpreter judgement due to suboptimal tracking. The  left ventricular internal cavity size was normal in size. There is no left  ventricular hypertrophy. Abnormal (paradoxical) septal motion, consistent  with left bundle branch block.  Indeterminate diastolic filling due to E-A fusion.   Right Ventricle: The right ventricular size is mildly enlarged. No  increase in right ventricular wall thickness. Right ventricular systolic  function is normal. Tricuspid regurgitation signal is inadequate for  assessing PA pressure.   Left Atrium: Left atrial size was mildly dilated.    Assessment/Plan Present on Admission:  Chest pain -Admit to stepdown -EDP spoke with cardiologist fellow on-call, recommendation anticoagulate, keep patient here at [facility] cardiology consult in the morning -Patient on Eliquis.  This has been discontinued, heparin drip started -Aspirin daily, lipid panel, high-dose atorvastatin -Patient has borderline blood pressure, on losartan and metoprolol.  Will not tolerate ACE inhibitor or beta-blocker -Gentle IV fluid bolus 500 cc, and continuous 75cc/hr for 12 hours -Jardiance continued -2D echo in a.m. -Cardiology  consult, [facility] cardiology -CTA chest negative for PE, dissection, pneumonia -GI cocktail given with some relief   Hypertension -Borderline hypotensive -Hydrating   Gout -Stable, allopurinol resumed   Acute deep vein thrombosis (DVT) of left lower extremity ([place]) -DC Eliquis.  Patient on heparin drip   Sleep apnea in adult -Does not use CPaP   Stage 2 chronic kidney disease due to benign hypertension  Barrett's esophagus -    Ilisa Hayworth 07/27/2022, 12:31 AM

## 2022-07-28 ENCOUNTER — Encounter (HOSPITAL_COMMUNITY): Payer: Self-pay | Admitting: Family Medicine

## 2022-07-28 ENCOUNTER — Other Ambulatory Visit (HOSPITAL_COMMUNITY): Payer: Self-pay

## 2022-07-28 ENCOUNTER — Observation Stay (HOSPITAL_COMMUNITY): Payer: Medicare PPO

## 2022-07-28 ENCOUNTER — Observation Stay (HOSPITAL_BASED_OUTPATIENT_CLINIC_OR_DEPARTMENT_OTHER)
Admit: 2022-07-28 | Discharge: 2022-07-28 | Disposition: A | Discharge status: Home / Self Care | Payer: Medicare PPO | Attending: Cardiology | Admitting: Cardiology

## 2022-07-28 DIAGNOSIS — I2583 Coronary atherosclerosis due to lipid rich plaque: Secondary | ICD-10-CM

## 2022-07-28 DIAGNOSIS — I428 Other cardiomyopathies: Secondary | ICD-10-CM

## 2022-07-28 DIAGNOSIS — R0789 Other chest pain: Principal | ICD-10-CM

## 2022-07-28 DIAGNOSIS — R079 Chest pain, unspecified: Secondary | ICD-10-CM | POA: Diagnosis not present

## 2022-07-28 DIAGNOSIS — I129 Hypertensive chronic kidney disease with stage 1 through stage 4 chronic kidney disease, or unspecified chronic kidney disease: Secondary | ICD-10-CM | POA: Diagnosis not present

## 2022-07-28 DIAGNOSIS — I2510 Atherosclerotic heart disease of native coronary artery without angina pectoris: Secondary | ICD-10-CM | POA: Diagnosis not present

## 2022-07-28 DIAGNOSIS — I700 Atherosclerosis of aorta: Secondary | ICD-10-CM

## 2022-07-28 DIAGNOSIS — K219 Gastro-esophageal reflux disease without esophagitis: Secondary | ICD-10-CM

## 2022-07-28 DIAGNOSIS — E782 Mixed hyperlipidemia: Secondary | ICD-10-CM | POA: Diagnosis not present

## 2022-07-28 DIAGNOSIS — G4730 Sleep apnea, unspecified: Secondary | ICD-10-CM | POA: Diagnosis not present

## 2022-07-28 DIAGNOSIS — E7800 Pure hypercholesterolemia, unspecified: Secondary | ICD-10-CM

## 2022-07-28 DIAGNOSIS — M10071 Idiopathic gout, right ankle and foot: Secondary | ICD-10-CM | POA: Diagnosis not present

## 2022-07-28 DIAGNOSIS — I824Y2 Acute embolism and thrombosis of unspecified deep veins of left proximal lower extremity: Secondary | ICD-10-CM | POA: Diagnosis not present

## 2022-07-28 DIAGNOSIS — K2270 Barrett's esophagus without dysplasia: Secondary | ICD-10-CM | POA: Diagnosis not present

## 2022-07-28 DIAGNOSIS — J9601 Acute respiratory failure with hypoxia: Secondary | ICD-10-CM | POA: Diagnosis not present

## 2022-07-28 DIAGNOSIS — R071 Chest pain on breathing: Secondary | ICD-10-CM | POA: Diagnosis not present

## 2022-07-28 LAB — CBC WITH DIFFERENTIAL/PLATELET
Abs Immature Granulocytes: 0.02 10*3/uL (ref 0.00–0.07)
Basophils Absolute: 0 10*3/uL (ref 0.0–0.1)
Basophils Relative: 1 %
Eosinophils Absolute: 0.3 10*3/uL (ref 0.0–0.5)
Eosinophils Relative: 3 %
HCT: 43.1 % (ref 39.0–52.0)
Hemoglobin: 13.5 g/dL (ref 13.0–17.0)
Immature Granulocytes: 0 %
Lymphocytes Relative: 30 %
Lymphs Abs: 2.5 10*3/uL (ref 0.7–4.0)
MCH: 30.2 pg (ref 26.0–34.0)
MCHC: 31.3 g/dL (ref 30.0–36.0)
MCV: 96.4 fL (ref 80.0–100.0)
Monocytes Absolute: 0.9 10*3/uL (ref 0.1–1.0)
Monocytes Relative: 10 %
Neutro Abs: 4.7 10*3/uL (ref 1.7–7.7)
Neutrophils Relative %: 56 %
Platelets: 214 10*3/uL (ref 150–400)
RBC: 4.47 MIL/uL (ref 4.22–5.81)
RDW: 14.9 % (ref 11.5–15.5)
WBC: 8.5 10*3/uL (ref 4.0–10.5)
nRBC: 0 % (ref 0.0–0.2)

## 2022-07-28 LAB — COMPREHENSIVE METABOLIC PANEL
ALT: 10 U/L (ref 0–44)
AST: 13 U/L — ABNORMAL LOW (ref 15–41)
Albumin: 3 g/dL — ABNORMAL LOW (ref 3.5–5.0)
Alkaline Phosphatase: 64 U/L (ref 38–126)
Anion gap: 6 (ref 5–15)
BUN: 23 mg/dL (ref 8–23)
CO2: 22 mmol/L (ref 22–32)
Calcium: 8.3 mg/dL — ABNORMAL LOW (ref 8.9–10.3)
Chloride: 105 mmol/L (ref 98–111)
Creatinine, Ser: 1.36 mg/dL — ABNORMAL HIGH (ref 0.61–1.24)
GFR, Estimated: 51 mL/min — ABNORMAL LOW (ref >60)
Glucose, Bld: 112 mg/dL — ABNORMAL HIGH (ref 70–99)
Potassium: 4.2 mmol/L (ref 3.5–5.1)
Sodium: 133 mmol/L — ABNORMAL LOW (ref 135–145)
Total Bilirubin: 1.6 mg/dL — ABNORMAL HIGH (ref 0.3–1.2)
Total Protein: 6.4 g/dL — ABNORMAL LOW (ref 6.5–8.1)

## 2022-07-28 LAB — TSH: TSH: 1.1 u[IU]/mL (ref 0.350–4.500)

## 2022-07-28 LAB — PHOSPHORUS: Phosphorus: 3.7 mg/dL (ref 2.5–4.6)

## 2022-07-28 LAB — MAGNESIUM: Magnesium: 2 mg/dL (ref 1.7–2.4)

## 2022-07-28 MED ORDER — SODIUM CHLORIDE 0.9 % IV BOLUS
500.0000 mL | Freq: Once | INTRAVENOUS | Status: AC
  Administered 2022-07-28: 500 mL via INTRAVENOUS

## 2022-07-28 MED ORDER — SACUBITRIL-VALSARTAN 24-26 MG PO TABS
1.0000 | ORAL_TABLET | Freq: Two times a day (BID) | ORAL | 0 refills | Status: DC
  Filled 2022-07-28: qty 60, 30d supply, fill #0

## 2022-07-28 MED ORDER — POLYVINYL ALCOHOL 1.4 % OP SOLN: 1.0000 [drp] | OPHTHALMIC | 0 refills | Status: DC | PRN

## 2022-07-28 MED ORDER — NITROGLYCERIN 0.4 MG SL SUBL
0.4000 mg | SUBLINGUAL_TABLET | SUBLINGUAL | 12 refills | Status: DC | PRN
  Filled 2022-07-28: qty 25, 30d supply, fill #0

## 2022-07-28 MED ORDER — GUAIFENESIN ER 600 MG PO TB12
600.0000 mg | ORAL_TABLET | Freq: Two times a day (BID) | ORAL | 0 refills | Status: AC
  Filled 2022-07-28: qty 10, 5d supply, fill #0

## 2022-07-28 MED ORDER — IOHEXOL 350 MG/ML SOLN
100.0000 mL | Freq: Once | INTRAVENOUS | Status: AC | PRN
  Administered 2022-07-28: 100 mL via INTRAVENOUS

## 2022-07-28 MED ORDER — ASPIRIN 81 MG PO TBEC: 81.0000 mg | DELAYED_RELEASE_TABLET | Freq: Every day | ORAL | 12 refills | Status: DC

## 2022-07-28 MED ORDER — ATORVASTATIN CALCIUM 80 MG PO TABS
80.0000 mg | ORAL_TABLET | Freq: Every day | ORAL | 0 refills | Status: DC
  Filled 2022-07-28: qty 30, 30d supply, fill #0

## 2022-07-28 MED ORDER — POLYVINYL ALCOHOL 1.4 % OP SOLN
1.0000 [drp] | OPHTHALMIC | 0 refills | Status: DC | PRN
  Filled 2022-07-28: qty 15, fill #0

## 2022-07-28 MED ORDER — ATORVASTATIN CALCIUM 80 MG PO TABS: 80.0000 mg | ORAL_TABLET | Freq: Every day | ORAL | 0 refills | Status: DC

## 2022-07-28 MED ORDER — NITROGLYCERIN 0.4 MG SL SUBL: 0.4000 mg | SUBLINGUAL_TABLET | SUBLINGUAL | 12 refills | Status: DC | PRN

## 2022-07-28 MED ORDER — ASPIRIN 81 MG PO TBEC
81.0000 mg | DELAYED_RELEASE_TABLET | Freq: Every day | ORAL | 12 refills | Status: DC
  Filled 2022-07-28: qty 30, 30d supply, fill #0

## 2022-07-28 MED ORDER — SACUBITRIL-VALSARTAN 24-26 MG PO TABS: 1.0000 | ORAL_TABLET | Freq: Two times a day (BID) | ORAL | 0 refills | Status: DC

## 2022-07-28 MED ORDER — PANTOPRAZOLE SODIUM 40 MG PO TBEC: 40.0000 mg | DELAYED_RELEASE_TABLET | Freq: Every day | ORAL | 0 refills | Status: DC

## 2022-07-28 MED ORDER — GUAIFENESIN ER 600 MG PO TB12: 600.0000 mg | ORAL_TABLET | Freq: Two times a day (BID) | ORAL | 0 refills | Status: DC

## 2022-07-28 NOTE — Plan of Care (Signed)

## 2022-07-28 NOTE — Care Management CC44 (Signed)
Condition Code 44 Documentation Completed  Patient Details  Name: Eric Chung MRN: [id number] Date of Birth: 26-Jun-1935   Condition Code 44 given:  Yes Patient signature on Condition Code 44 notice:  Yes Documentation of 2 MD's agreement:  Yes Code 44 added to claim:  Yes    Pa Tennant, RN 07/28/2022, 10:16 AM

## 2022-07-28 NOTE — Evaluation (Signed)
Clinical/Bedside Swallow Evaluation Patient Details  Name: Zykee Avakian MRN: [id number] Date of Birth: 1934-11-13  Today's Date: 07/28/2022 Time: SLP Start Time (ACUTE ONLY): 3419 SLP Stop Time (ACUTE ONLY): 1605 SLP Time Calculation (min) (ACUTE ONLY): 35 min  Past Medical History:  Past Medical History:  Diagnosis Date   Apnea, sleep    Barrett's esophagus    Gout    Hypertension    Obesity    Pulmonary emboli [facility])    Past Surgical History:  Past Surgical History:  Procedure Laterality Date   CHOLECYSTECTOMY  08/29/2017   EYE SURGERY Bilateral 2011-2012   catracts removed   HERNIA REPAIR  2005   T. Carlton Adam MD   TONSILECTOMY/ADENOIDECTOMY WITH MYRINGOTOMY  1944   HPI:  87 yo male adm to [facility] with chest pain - He received nytroglycerin with report of improved comfort - but vomiting after last dosate. pt has h/o GERD, Barrett's esophagus, hiatal hernia s/p repair.  He reports he saw GI MD approx five years ago in [place] and had the hiatal hernia repair approx 2 years ago.  He admits to having some Reflux issues if he eats poorly.  Denies having heartburn PTA.  Pt is on  PPI and reports he has been on the same dose for years - only has breakthrough reflux if he eats spicy, tomato based foods. Admits reflux reaches level of the pharynx.  CT imaging concerning for Small hiatal hernia and patulous distal esophagus, may be indicative of esophageal dysmotility per radiologist note.   Question early interstitial lung disease versus sequela of prior aspiration.    MD ordered SLP evaluation given findings of CT.  Pt denies dysphagia but does endorse occasional reflux.    Assessment / Plan / Recommendation  Clinical Impression  Pt presents with normal oropharyngeal swallow ability based on clinical swallow evaluation. No focal CN deficits - and pt denies any CVA or neuro history. He does endorse reflux that reaches to level of the throat and the voice box - suspect this is source of  potential aspiration findings on CT chest.  Per review, pt has h/o Barrett's, reflux - takes a PPI daily, and hiatal hernia s/p repair approx 2 years ago.  He reports he was last scoped by GI MD in [place] approx 5 years ago.    He denies any symptoms of oropharyngeal dysphagia.    Pt easily passed 3 ounce Yale swallow challenge -eructation noted following. Reflux Symptom Index *RSI* administered with pt scoring 19/45 - indicative of Laryngopharyngeal reflux per authors.  Provided pt with verbal and written reflux precautions and dysmotility compensations using teach back.  SLP highly encouraged pt to follow up with GI.  No SLP follow up needed. SLP Visit Diagnosis: Dysphagia, unspecified (R13.10)    Aspiration Risk  Mild aspiration risk    Diet Recommendation Regular;Thin liquid   Liquid Administration via: Cup;Straw Medication Administration: Whole meds with liquid Supervision: Patient able to self feed Compensations: Slow rate;Small sips/bites Postural Changes: Seated upright at 90 degrees;Remain upright for at least 30 minutes after po intake    Other  Recommendations Oral Care Recommendations: Oral care BID    Recommendations for follow up therapy are one component of a multi-disciplinary discharge planning process, led by the attending physician.  Recommendations may be updated based on patient status, additional functional criteria and insurance authorization.  Follow up Recommendations No SLP follow up      Assistance Recommended at Discharge  None  Functional Status Assessment Patient has  not had a recent decline in their functional status  Frequency and Duration     Good       Prognosis   Good      Swallow Study   General Date of Onset: 07/28/22 HPI: 87 yo male adm to [facility] with chest pain - He received nytroglycerin with report of improved comfort - but vomiting after last dosate. pt has h/o GERD, Barrett's esophagus, hiatal hernia s/p repair.  He reports he saw GI MD approx  five years ago in [place] and had the hiatal hernia repair approx 2 years ago.  He admits to having some Reflux issues if he eats poorly.  Denies having heartburn PTA.  Pt is on  PPI and reports he has been on the same dose for years - only has breakthrough reflux if he eats spicy, tomato based foods. Admits reflux reaches level of the pharynx.  CT imaging concerning for Small hiatal hernia and patulous distal esophagus, may be indicative of esophageal dysmotility per radiologist note.   Question early interstitial lung disease versus sequela of prior aspiration.    MD ordered SLP evaluation given findings of CT.  Pt denies dysphagia but does endorse occasional reflux. Type of Study: Bedside Swallow Evaluation Diet Prior to this Study: Regular;Thin liquids Temperature Spikes Noted: No Respiratory Status: Room air History of Recent Intubation: No Behavior/Cognition: Alert;Pleasant mood;Cooperative Oral Cavity Assessment: Within Functional Limits Oral Care Completed by SLP: No Oral Cavity - Dentition: Adequate natural dentition Vision: Functional for self-feeding Self-Feeding Abilities: Able to feed self Patient Positioning: Upright in chair Baseline Vocal Quality: Normal Volitional Cough: Strong Volitional Swallow: Able to elicit    Oral/Motor/Sensory Function Overall Oral Motor/Sensory Function: Within functional limits   Ice Chips Ice chips: Not tested   Thin Liquid Thin Liquid: Within functional limits Presentation: Cup    Nectar Thick Nectar Thick Liquid: Not tested   Honey Thick Honey Thick Liquid: Not tested   Puree Puree: Not tested   Solid     Solid: Not tested      Macario Golds 07/28/2022,4:42 PM  Kathleen Lime, MS [address] [facility] SLP Acute Rehab Services Office [phone] Pager [phone]

## 2022-07-28 NOTE — Discharge Summary (Signed)
Physician Discharge Summary   Patient: Eric Chung MRN: [id number] DOB: 1935/03/15  Admit date:     07/26/2022  Discharge date: 07/28/22  Discharge Physician: Raiford Noble, DO   PCP: Mast, Man X, NP   Recommendations at discharge:   Follow-up with PCP within 1 to 2 weeks and repeat CBC, CMP, mag, Phos within 1 week Follow-up with cardiology in 1 to 2 weeks and continue close monitoring Follow-up with gastroenterology within 1 to 2 weeks given his findings of a palate cyst esophagus and concern for aspiration; [facility] will call and schedule an outpatient appointment Follow-up with pulmonary for possible interstitial lung disease and have high-res CT scan done in outpatient setting; referral has been made an appointment has been set up for December 13 at 9:30 AM  Discharge Diagnoses: Principal Problem:   Chest pain Active Problems:   Edema   Gout of ankle   Hypertension   Barrett's esophagus   Migraine headache with aura   Sleep apnea in adult   Hyperlipidemia   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 *  Hospital Course: he patient is a 87 year old obese Caucasian male with a past medical history significant for but not limited to cardiomyopathy with an EF of 35 to 40% with left ventricular global hypokinesis with an echo done in May 2023 as a has a history of remote polysubstance abuse and other cardiac risk factors including but not limited to hypertension, untreated sleep apnea as well as a history of PE and DVT.  Yesterday evening after supper he developed severe left-sided chest and shoulder pain.  The pain continued to worsen and became different and moved all across his torso.  He felt like it was a "tightening like a belt "on his chest and it kept getting worse.  He went downstairs to [name] the pain continued to increase.  He was slept  okay but then woke up this morning and the pain worsened so he subsequently drove himself to the ED for further evaluation.  He describes the pain as sharp and constant and states he is never had This before.  He states this is not feeling like the same type of pain that he has when he has heartburn.  He also noted that activity makes the pain worse and movement does not really affect the pain.  In the ED he was given 3 sublingual nitroglycerin and use nitroglycerin tablet incrementally decrease the pain and on presentation it was a 10 out of 10 and then at the time of evaluation by the admitting physician was a 3 out of 10.  After the nitroglycerin his blood pressure dropped to the 80s though and troponins were flat at 29 and 30.  He had an abnormal EKG but no real acute ischemic changes.  Cardiology was consulted for further evaluation recommendations.  Cardiology felt that his pain was consistent with a pleuritic chest pain however could also have some pericarditis.  They recommended obtaining a coronary CT as well as an echocardiogram and starting the patient on NSAIDs and colchicine likely  The patient underwent coronary CT scan today and showed a coronary calcium score of 41 with 25 to 49% pLAD/01 and dRCA stenosis and aortic atherosclerosis.  Noncardiac portion showing possible early ILD versus aspiration with a small hiatal hernia and possible esophageal dysmotility.  Cardiology suspected his chest pain  to be of noncardiac origin and felt like it was a GI etiology with GERD and esophageal dysmotility but could have also pleuritic chest pain for possible aspiration.  His ESR was normal but CRP was elevated slightly.  There is no evidence of pericardial effusion on echocardiogram or CTA and cardiology felt this was not acute pericarditis his colchicine was stopped.  His high sensitive troponin was minimally elevated but flat and not consistent with ACS and he had mild nonobstructive CAD on the CT scan.   Cardiology recommending starting aspirin 81 daily, low-dose beta-blocker with atorvastatin and check an FLP and ALT in outpatient setting.  He does have dilated cardiomyopathy that was out of proportion to the degree of CAD this was felt likely to be a nonischemic cardiomyopathy.  Cardiology placed the patient on max GDMT.  He is deemed medically stable and improved significantly and was weaned off of oxygen.  GI was contacted and they are recommending outpatient follow-up and they will arrange follow-up.  Patient had a SLP evaluation and recommended regular diet and follow-up with GI in outpatient setting.  Given that he ate prior to possible ILD versus aspiration on the CT scan an appointment will be made with pulmonary and he will need outpatient high-res CT scan.  He is medically stable for discharge at this time and will need to follow-up with PCP, cardiology, gastroenterology as well as pulmonary in the outpatient setting  Assessment and Plan:  Chest pain rule out ACS likely pleuritic however has a component of pericarditis likely -He is being admitted to the stepdown unit as observation -EDP spoke with the cardiology fellow who recommended anticoagulation keeping the patient at length along with cardiology consult in the morning -Patient is normally anticoagulated Eliquis for history of PE and DVT and this was discontinued and heparin drip was initiated -Patient was initiated on aspirin, and high-dose statin and lipid panel is being ordered -He had borderline hypotension so his ACE and beta-blocker were held initially but has now been resumed on his losartan 25 mg p.o. daily -Given gentle IV fluid normal saline at 75 mL/h and 500 mL bolus -His SGLT2 inhibitor has been continued and an echocardiogram is being ordered -Cardiology has been officially consulted and patient underwent a CTA of the chest was negative for PE, dissection or pneumonia however did show that he has aortic and coronary artery  atherosclerosis without aneurysm, dissection or stenosis and he does have cardiomegaly without evidence of CHF.  The upper lumbar pulmonary arteries were without evidence of central embolus and he does have a bronchitis with a mosaic lung attenuation consistent with small airway disease.  Patient was also noted to have a hiatal hernia of a small as well as fatty liver with scattered cysts and nonobstructive nephrolithiasis, bilateral renal cysts as well as diverticulosis without diverticulitis.  Patient was noted to also have prostatic megaly, umbilical and supraumbilical and inguinal fat hernias as well as osteopenia and degenerative changes with multilevel acquired spinal stenosis which is most severe at L4-L5 -Cardiology is evaluating and recommending obtaining a coronary CT and will consider starting NSAIDs as well as colchicine for suspected pericarditis; cardiology feels less likely that this is cardiac in origin and do not suspect pericarditis anymore so stopped his colchicine -CTA was done and as above and cardiology feels that this is a GI in origin or pulmonary due to pleuritic nature.  He is medically stable and improved and will follow-up with PCP, cardiology as well as gastroenterology and  operative setting -Per cardiology He does have dilated cardiomyopathy that was out of proportion to the degree of CAD this was felt likely to be a nonischemic cardiomyopathy.  Cardiology placed the patient on max GDMT.  He is deemed medically stable and improved significantly and was weaned off of oxygen.  -Follow-up with them within 1 to 2 weeks   Acute respiratory failure with hypoxia, improved -Likely in the setting of his acute bronchitis -Continue supplemental oxygen via nasal cannula wean O2 as tolerated -Continuous pulse oximetry maintain O2 saturation greater than 90% SpO2: 92 % O2 Flow Rate (L/min): 2 L/min; was weaned off of oxygen during an amatory home O2 screen -Continue supportive care and  repeat chest x-ray in the a.m. -Will add Xopenex and Atrovent -We will add guaifenesin 1200 mg p.o. twice daily, flutter valve, incentive spirometry -Follow-up with pulmonary for high-res solution CT scan   Hypertension -Was borderline Hypotensive -Held BB but continuing ARB -Continue to Monitor BP per protocol -Last BP reading is on the softer side but is stable at 97/47 and asymptomatic   Gout -Stable and now allopurinol been resumed -Will get NSAIDs for pericarditis and colchicine but now colchicine is being started   Acute DVT of the left lower extremity with history of PE -Anticoagulation with apixaban has been held and he is being initiated on a heparin drip: Resume apixaban on discharge   Obstructive sleep apnea -Does not wear CPAP but if necessary will order him on while hospitalized and he will need an outpatient polysomnography   Stage II kidney disease due to benign hypertension -Patient's BUNs/creatinine has improved and gone from 16/1.25 and is now 17/1.12 yesterday but slightly trended up to 23/1.36 -Avoid nephrotoxic medications, contrast dyes, hypotension and dehydration to ensure adequate renal perfusion -Repeat CMP within 1 week   GERD/Barrett's esophagus -Continue with PPI and this was changed to pantoprazole and will need to defer to GI in the outpatient setting to increase   Obesity -Complicates overall prognosis and care -Estimated body mass index is 31.95 kg/m as calculated from the following:   Height as of this encounter: _0  (1.803 m).   Weight as of this encounter: 103.9 kg.  -Weight Loss and Dietary Counseling given  Consultants: Cardiology Procedures performed: CTA and echo Disposition: Home Diet recommendation:  Discharge Diet Orders (From admission, onward)     Start     Ordered   07/28/22 0000  Diet - low sodium heart healthy        07/28/22 1553           Cardiac diet DISCHARGE MEDICATION: Allergies as of 07/28/2022        Reactions   Penicillins    Has patient had a PCN reaction causing immediate rash, facial/tongue/throat swelling, SOB or lightheadedness with hypotension: yes, rash Has patient had a PCN reaction causing severe rash involving mucus membranes or skin necrosis: no Has patient had a PCN reaction that required hospitalization: no Has patient had a PCN reaction occurring within the last 10 years: No If all of the above answers are "NO", then may proceed with Cephalosporin use.        Medication List     STOP taking these medications    ibuprofen 200 MG tablet Commonly known as: ADVIL   losartan 25 MG tablet Commonly known as: COZAAR   omeprazole 20 MG capsule Commonly known as: PRILOSEC Replaced by: pantoprazole 40 MG tablet       TAKE these medications  allopurinol 100 MG tablet Commonly known as: ZYLOPRIM TAKE ONE TABLET BY MOUTH TWICE DAILY   apixaban 2.5 MG Tabs tablet Commonly known as: ELIQUIS Take 2.5 mg by mouth 2 (two) times a day.   aspirin EC 81 MG tablet Take 1 tablet (81 mg total) by mouth daily. Swallow whole. Start taking on: July 29, 2022   atorvastatin 80 MG tablet Commonly known as: LIPITOR Take 1 tablet (80 mg total) by mouth daily.   cephALEXin 500 MG capsule Commonly known as: KEFLEX Take 1 capsule (500 mg total) by mouth 2 (two) times daily for 5 days.   empagliflozin 10 MG Tabs tablet Commonly known as: Jardiance Take 1 tablet (10 mg total) by mouth daily before breakfast.   Entresto 24-26 MG Generic drug: sacubitril-valsartan Take 1 tablet by mouth 2 (two) times daily.   fluticasone 50 MCG/ACT nasal spray Commonly known as: FLONASE Place 1 spray into both nostrils daily.   guaiFENesin 600 MG 12 hr tablet Commonly known as: MUCINEX Take 1 tablet (600 mg total) by mouth 2 (two) times daily for 5 days.   HYDROcodone-acetaminophen 5-325 MG tablet Commonly known as: NORCO/VICODIN Take 1-2 tablets by mouth every 6 (six) hours as  needed. What changed: reasons to take this   latanoprost 0.005 % ophthalmic solution Commonly known as: XALATAN Place 1 drop into both eyes at bedtime.   metoprolol succinate 25 MG 24 hr tablet Commonly known as: Toprol XL Take 0.5 tablets (12.5 mg total) by mouth daily. What changed: when to take this   multivitamin with minerals tablet Take 1 tablet by mouth daily.   nitroGLYCERIN 0.4 MG SL tablet Commonly known as: NITROSTAT Place 1 tablet (0.4 mg total) under the tongue every 5 (five) minutes as needed for chest pain.   ondansetron 4 MG disintegrating tablet Commonly known as: ZOFRAN-ODT Take 1 tablet (4 mg total) by mouth every 8 (eight) hours as needed for nausea or vomiting.   pantoprazole 40 MG tablet Commonly known as: PROTONIX Take 1 tablet (40 mg total) by mouth daily. Start taking on: July 29, 2022 Replaces: omeprazole 20 MG capsule   polyvinyl alcohol 1.4 % ophthalmic solution Commonly known as: LIQUIFILM TEARS Place 1 drop into both eyes as needed for dry eyes.   SUMAtriptan 50 MG tablet Commonly known as: IMITREX TAKE 1 TABLET AS NEEDED FOR MIGRAINE. What changed: See the new instructions.        Follow-up Information     Mast, Man X, NP. Call.   Specialty: Internal Medicine Why: Follow up within 1-2 weeks Contact information: [address] [address] [phone]         Martyn Ehrich, NP. Go on 08/05/2022.   Specialty: Pulmonary Disease Why: Follow up Pulmonary Appointment at 9:30 AM for question early interstitial lung disease versus sequela of prior aspiration. Will need follow-up high-resolution chest CT Scan. Contact information: [address] [place] [address] [phone]                Discharge Exam: Danley Danker Weights   07/27/22 1214  Weight: 103.9 kg   Vitals:   07/28/22 1400 07/28/22 1519  BP: (!) 97/47   Pulse: (!) 56   Resp: 16   Temp:  98 F (36.7 C)  SpO2: 92%     Examination: Physical Exam:  Constitutional: WN/WD elderly obese Caucasian male currently no acute distress Respiratory: Diminished to auscultation bilaterally with coarse breath sounds, no wheezing, rales, rhonchi or  crackles. Normal respiratory effort and patient is not tachypenic. No accessory muscle use.  Unlabored breathing Cardiovascular: RRR, no murmurs / rubs / gallops. S1 and S2 auscultated. No extremity edema.  Abdomen: Soft, non-tender, distended secondary to body habitus. Bowel sounds positive.  GU: Deferred. Musculoskeletal: No clubbing / cyanosis of digits/nails. No joint deformity upper and lower extremities. .  Skin: No rashes, lesions, ulcers on limited skin evaluation. No induration; Warm and dry.  Neurologic: CN 2-12 grossly intact with no focal deficits. Romberg sign and cerebellar reflexes not assessed.  Psychiatric: Normal judgment and insight. Alert and oriented x 3. Normal mood and appropriate affect.   Condition at discharge: stable  The results of significant diagnostics from this hospitalization (including imaging, microbiology, ancillary and laboratory) are listed below for reference.   Imaging Studies: CT CORONARY MORPH W/CTA COR W/SCORE W/CA W/CM &/OR WO/CM  Addendum Date: 07/28/2022   ADDENDUM REPORT: 07/28/2022 10:04 CLINICAL DATA:  87 yo male with cardiomyopathy and chest pain EXAM: Cardiac/Coronary CTA TECHNIQUE: A non-contrast, gated CT scan was obtained with axial slices of 3 mm through the heart for calcium scoring. Calcium scoring was performed using the Agatston method. A 120 kV prospective, gated, contrast cardiac scan was obtained. Gantry rotation speed was 250 msecs and collimation was 0.6 mm. Two sublingual nitroglycerin tablets (0.8 mg) were given. The 3D data set was reconstructed in 5% intervals of the 35-75% of the R-R cycle. Diastolic phases were analyzed on a dedicated workstation using MPR, MIP, and VRT modes. The patient received 95 cc of  contrast. FINDINGS: Image quality: Average. Noise artifact is: Limited. (signal-to-noise). Coronary Arteries:  Normal coronary origin.  Right dominance. Left main: The left main is a large caliber vessel with a normal take off from the left coronary cusp that trifurcates into a LAD, LCX, and ramus intermedius. There is minimal (0-24) soft and calcified plaque. Left anterior descending artery: The LAD has mild (25-49-low end) soft plaque in the proximal vessel. The LAD gives off 1 patent diagonal branch with minimal (0-24) plaque in the ostium. Ramus intermedius: Patent with no evidence of plaque or stenosis. Left circumflex artery: The LCX is non-dominant and patent with no evidence of plaque or stenosis. The LCX gives off 2 obtuse marginal branches. There is mild (25-49) soft plaque in the ostium of OM1. Right coronary artery: The RCA is dominant with normal take off from the right coronary cusp. There minimal (0-24) soft plaque in the proximal vessel and there is mild (25-49) soft plaque in the distal vessel. The RCA terminates as a PDA and right posterolateral branch without evidence of plaque or stenosis. Right Atrium: Right atrial size is within normal limits. Right Ventricle: The right ventricular cavity is within normal limits. Left Atrium: Left atrial size is normal in size with no left atrial appendage filling defect. Left Ventricle: The ventricular cavity size is within normal limits. There are no stigmata of prior infarction. There is no abnormal filling defect. Pulmonary arteries: Normal in size without proximal filling defect. Pulmonary veins: Normal pulmonary venous drainage. Pericardium: Normal thickness with no significant effusion or calcium present. Cardiac valves: The aortic valve is trileaflet without significant calcification. The mitral valve is normal structure without significant calcification. Aorta: Normal caliber with aortic atherosclerosis. Extra-cardiac findings: See attached radiology  report for non-cardiac structures. IMPRESSION: 1. Coronary calcium score of 41.1. This was 8 percentile for age-, sex, and race-matched controls. 2. Normal coronary origin with right dominance. 3. Mild (25-49) stenoses in the proximal  LAD, OM1 and distal RCA. 4. Aortic atherosclerosis. RECOMMENDATIONS: CAD-RADS 2: Mild non-obstructive CAD (25-49%). Consider non-atherosclerotic causes of chest pain. Consider preventive therapy and risk factor modification. Kirk Ruths, MD Electronically Signed   By: Kirk Ruths M.D.   On: 07/28/2022 10:04   Result Date: 07/28/2022 EXAM: OVER-READ INTERPRETATION  CT CHEST The following report is a limited chest CT over-read performed by radiologist Dr. Zetta Bills of [facility], [place] on 07/28/2022. This over-read does not include interpretation of cardiac or coronary anatomy or pathology. The coronary calcium and coronary CT angiography interpretation by the cardiologist is attached. COMPARISON:  July 26, 2022 FINDINGS: Vascular: See dedicated report for cardiovascular details. Mediastinum/Nodes: No adenopathy or acute process in the mediastinum. Lungs/Pleura: Lingular volume loss or scarring. Subtle areas of filling of the lower lobe bronchi. Mild interstitial prominence at the LEFT lung base with subpleural reticulation also present on the RIGHT is similar to prior imaging. Upper Abdomen: Incidental imaging of upper abdominal contents without acute findings. Assessment limited. Signs of hepatic cysts. Small hiatal hernia and patulous distal esophagus. Musculoskeletal: No acute bone finding or destructive bone process. IMPRESSION: 1. Question early interstitial lung disease versus sequela of prior aspiration. Correlate with any risk factors for or history of aspiration change. Consider follow-up high-resolution chest CT or swallowing evaluation as warranted on a nonemergent basis. 2. Small hiatal hernia and patulous distal esophagus, may be indicative of  esophageal dysmotility. Electronically Signed: By: Zetta Bills M.D. On: 07/28/2022 08:46   DG CHEST PORT 1 VIEW  Result Date: 07/28/2022 CLINICAL DATA:  Chest pain EXAM: PORTABLE CHEST 1 VIEW COMPARISON:  July 26, 2022 FINDINGS: EKG leads project over the chest. Cardiomediastinal contours and hilar structures are stable, heart size accentuated by AP projection may be mild to moderately enlarged. No lobar consolidation but with persistent airspace disease at the LEFT lung base, unchanged. No pneumothorax. On limited assessment no acute skeletal process. IMPRESSION: Persistent airspace disease at the LEFT lung base, unchanged. Findings likely reflecting volume loss/atelectasis. Electronically Signed   By: Zetta Bills M.D.   On: 07/28/2022 08:11   ECHOCARDIOGRAM COMPLETE  Result Date: 07/27/2022    ECHOCARDIOGRAM REPORT   Patient Name:   MALYK GIROUARD Date of Exam: 07/27/2022 Medical Rec #:  [id number]        Height:       71.0 in Accession #:    [id number]       Weight:       229.1 lb Date of Birth:  12-May-1935        BSA:          2.234 m Patient Age:    87 years         BP:           109/64 mmHg Patient Gender: M                HR:           80 bpm. Exam Location:  Inpatient Procedure: 2D Echo, Cardiac Doppler, Color Doppler and Intracardiac            Opacification Agent Indications:    R07.9* Chest pain, unspecified  History:        Patient has prior history of Echocardiogram examinations, most                 recent 01/05/2022. Cardiomyopathy, Abnormal ECG, Arrythmias:PVC,                 Signs/Symptoms:Chest  Pain; Risk Factors:Hypertension,                 Dyslipidemia and Sleep Apnea. Polysubstance abuse.  Sonographer:    Roseanna Rainbow RDCS Referring Phys: [id number] [facility]  Sonographer Comments: Technically difficult study due to poor echo windows. Image acquisition challenging due to patient body habitus. IMPRESSIONS  1. Exremelly poor acoustic windows llimit study, even with Definity  use. No significant change in reported LVEF from previous echo. Left ventricular ejection fraction, by estimation, is 35 to 40%. The left ventricle has moderately decreased function. The left ventricular internal cavity size was mildly dilated.  2. Right ventricular systolic function is normal. The right ventricular size is normal.  3. The mitral valve is normal in structure. No evidence of mitral valve regurgitation.  4. The aortic valve is normal in structure. Aortic valve regurgitation is mild. FINDINGS  Left Ventricle: Exremelly poor acoustic windows llimit study, even with Definity use. No significant change in reported LVEF from previous echo. Left ventricular ejection fraction, by estimation, is 35 to 40%. The left ventricle has moderately decreased  function. The left ventricular internal cavity size was mildly dilated. There is no left ventricular hypertrophy. Right Ventricle: The right ventricular size is normal. Right vetricular wall thickness was not assessed. Right ventricular systolic function is normal. Left Atrium: Left atrial size was normal in size. Right Atrium: Right atrial size was normal in size. Pericardium: There is no evidence of pericardial effusion. Mitral Valve: The mitral valve is normal in structure. No evidence of mitral valve regurgitation. Tricuspid Valve: The tricuspid valve is normal in structure. Tricuspid valve regurgitation is trivial. Aortic Valve: The aortic valve is normal in structure. Aortic valve regurgitation is mild. Pulmonic Valve: The pulmonic valve was normal in structure. Pulmonic valve regurgitation is trivial. Aorta: The aortic root and ascending aorta are structurally normal, with no evidence of dilitation. Venous: The inferior vena cava was not well visualized. IAS/Shunts: No atrial level shunt detected by color flow Doppler.  LEFT VENTRICLE PLAX 2D LVIDd:         5.60 cm     Diastology LVIDs:         4.60 cm     LV e' medial:    12.00 cm/s LV PW:         1.20 cm      LV E/e' medial:  3.9 LV IVS:        0.90 cm     LV e' lateral:   7.53 cm/s LVOT diam:     2.70 cm     LV E/e' lateral: 6.2 LV SV:         92 LV SV Index:   41 LVOT Area:     5.73 cm  LV Volumes (MOD) LV vol d, MOD A2C: 78.4 ml LV vol d, MOD A4C: 85.4 ml LV vol s, MOD A2C: 39.9 ml LV vol s, MOD A4C: 59.7 ml LV SV MOD A2C:     38.5 ml LV SV MOD A4C:     85.4 ml LV SV MOD BP:      34.8 ml RIGHT VENTRICLE RV S prime:     8.15 cm/s TAPSE (M-mode): 2.0 cm LEFT ATRIUM           Index        RIGHT ATRIUM           Index LA diam:      2.60 cm 1.16 cm/m   RA Area:  16.30 cm LA Vol (A2C): 31.3 ml 14.01 ml/m  RA Volume:   39.10 ml  17.50 ml/m LA Vol (A4C): 32.1 ml 14.37 ml/m  AORTIC VALVE LVOT Vmax:   103.00 cm/s LVOT Vmean:  64.500 cm/s LVOT VTI:    0.161 m  AORTA Ao Root diam: 4.20 cm Ao Asc diam:  3.60 cm MITRAL VALVE MV Area (PHT): 3.40 cm    SHUNTS MV Decel Time: 223 msec    Systemic VTI:  0.16 m MV E velocity: 46.40 cm/s  Systemic Diam: 2.70 cm MV A velocity: 60.60 cm/s MV E/A ratio:  0.77 Dorris Carnes MD Electronically signed by Dorris Carnes MD Signature Date/Time: 07/27/2022/7:10:04 PM    Final    CT Angio Chest/Abd/Pel for Dissection W and/or Wo Contrast  Result Date: 07/26/2022 CLINICAL DATA:  Left chest and shoulder pain with diaphoresis. Acute aortic syndrome suspected. EXAM: CT ANGIOGRAPHY CHEST, ABDOMEN AND PELVIS TECHNIQUE: Noncontrast chest CT at 5 mm axial intervals to the adrenal glands was performed initially to assess for aortic hematoma. Multidetector CT imaging through the chest, abdomen and pelvis was performed using the standard protocol during bolus administration of intravenous contrast. Multiplanar reconstructed images and MIPs were obtained and reviewed to evaluate the vascular anatomy. RADIATION DOSE REDUCTION: This exam was performed according to the departmental dose-optimization program which includes automated exposure control, adjustment of the mA and/or kV according to patient  size and/or use of iterative reconstruction technique. CONTRAST:  143m OMNIPAQUE IOHEXOL 350 MG/ML SOLN COMPARISON:  Portable chest today, portable chest 05/23/2022, CT abdomen and pelvis no contrast recently 07/24/2022, CT abdomen and pelvis with contrast 08/27/2017. FINDINGS: CTA CHEST FINDINGS Cardiovascular: There is mild cardiomegaly with a left chamber predominance and trace three-vessel calcific CAD. No pericardial effusion. Veins are normal in caliber. Pulmonary arteries are upper-normal caliber with no central embolus. There is mild mixed calcific and soft plaque in the aortic arch and descending segment with mild descending tortuosity. The great vessels demonstrate minimal calcific plaque but are widely patent. There is no aortic aneurysm, dissection or stenosis. Mediastinum/Nodes: No enlarged mediastinal, hilar, or axillary lymph nodes. Thyroid gland, trachea, and esophagus demonstrate no significant findings. There is mild mediastinal lipomatosis and a small hiatal hernia. Lungs/Pleura: There is respiratory motion limiting evaluation of the lung fields. There is a low inspiration on exam. There is diffuse bronchial thickening. Posterior atelectasis noted in the lower lobes and diffuse mosaic lung attenuation consistent with small airways disease. No confluent pneumonia is seen or appreciable nodules. There is no pleural effusion, thickening or pneumothorax. Musculoskeletal: There is osteopenia with degenerative changes of the thoracic spine. Mild thoracic dextroscoliosis. Healed fracture deformity proximal right humerus and bilateral mild shoulder DJD. The ribcage is intact. No chest wall mass is seen. Review of the MIP images confirms the above findings. CTA ABDOMEN AND PELVIS FINDINGS VASCULAR Aorta: There are moderate mixed plaques without dissection, stenosis or aneurysm. Celiac: There is a 50% calcific stenosis in the proximal 6 mm of the vessel with mild fusiform poststenotic ectasia, patchy  calcification in the splenic artery without visible branch vessel stenosis. There is a normal variant left gastric artery origin from the abdominal aorta just above the celiac origin which has a 75% soft plaque origin stenosis and otherwise opacifies well. SMA: There is moderate mixed plaque up to and just distal to the inflection point of the vessel, and 3 cm distal to the vessel origin there is soft plaque causing a 60% focal stenosis. The SMA otherwise without  flow-limiting stenosis, and no aneurysm or dissection. Renals: There are mild calcific plaques in the proximal renal arteries but no flow-limiting stenosis, aneurysm or dissection. On the left there is early takeoff of a hypoplastic upper pole artery without stenosis. IMA: 80% or greater calcific origin stenosis. Otherwise the vessel opacifies well. Inflow: There are moderate patchy calcific/mixed plaques in the common iliac and internal iliac arteries, without flow-limiting narrowing. Both external iliac arteries show only minimal calcific plaque and no stenosis, aneurysm or dissection. The proximal outflow vessels are widely patent with mild calcifications in the common femoral arteries. Veins: Unopacified and not evaluated. The main portal vein and IVC are within normal caliber limits. Review of the MIP images confirms the above findings. NON-VASCULAR Hepatobiliary: 19 cm in length liver with mild general fatty replacement. There are scattered cysts, largest in the left lobe is 2.5 cm, largest on the right is in the anterior segment measuring 2 cm. Gallbladder is absent without biliary prominence. Pancreas: Partially atrophic, otherwise unremarkable. Spleen: No mass enhancement or splenomegaly. Adrenals/Urinary Tract: 4 mm and 3 mm nonobstructing caliceal stones are noted in the upper and lower right renal collecting system. Bilateral small parapelvic cysts and bilateral cortical renal cysts are again shown, largest are in the right kidney measuring 5.4  cm laterally in the midpole and 3.2 cm in the lower pole. Cysts on the left are smaller, largest is a 2 cm parapelvic cyst. No mass enhancement is seen, no ureteral stones or hydronephrosis and no adrenal mass. The bladder is unremarkable for the degree of distention. Stomach/Bowel: No dilatation or wall thickening. An appendix is not seen in this patient. There is diffuse diverticulosis. No evidence of colitis or diverticulitis. Lymphatic: No adenopathy is seen. Reproductive: Prostatomegaly with transverse diameter 5.2 cm. Other: There is no free air, free hemorrhage, free fluid or acute inflammatory changes. Small supraumbilical, umbilical and inguinal fat hernias are noted without incarcerated hernia. Musculoskeletal: There is osteopenia with degenerative changes in the lumbar spine. Severe acquired spinal stenosis L4-5, moderate acquired spinal stenosis L2-3 and L3-4. No acute or other significant osseous findings. Review of the MIP images confirms the above findings. IMPRESSION: 1. Aortic and coronary artery atherosclerosis without aneurysm, dissection or stenosis. 2. Cardiomegaly without evidence of CHF. 3. Upper-normal pulmonary arteries without evidence of central embolus. 4. Bronchitis with mosaic lung attenuation consistent with small airways disease. 5. Small hiatal hernia. 6. Fatty liver with scattered cysts. 7. Nonobstructive nephrolithiasis. 8. Bilateral renal cysts. 9. Diverticulosis without evidence of diverticulitis. 10. Prostatomegaly. 11. Umbilical, supraumbilical and inguinal fat hernias. 12. Osteopenia and degenerative change with multilevel acquired spinal stenosis most severe at L4-5. Electronically Signed   By: Telford Nab M.D.   On: 07/26/2022 23:46   DG Chest Port 1 View  Result Date: 07/26/2022 CLINICAL DATA:  Chest pain EXAM: PORTABLE CHEST 1 VIEW COMPARISON:  Chest x-ray 05/23/2022 FINDINGS: Heart is enlarged, unchanged. There is no focal lung infiltrate, pleural effusion or  pneumothorax. No acute fractures are seen. IMPRESSION: No active disease. Electronically Signed   By: Ronney Asters M.D.   On: 07/26/2022 21:19   CT Renal Stone Study  Result Date: 07/24/2022 CLINICAL DATA:  Flank pain.  Kidney stone suspected EXAM: CT ABDOMEN AND PELVIS WITHOUT CONTRAST TECHNIQUE: Multidetector CT imaging of the abdomen and pelvis was performed following the standard protocol without IV contrast. RADIATION DOSE REDUCTION: This exam was performed according to the departmental dose-optimization program which includes automated exposure control, adjustment of the mA and/or kV  according to patient size and/or use of iterative reconstruction technique. COMPARISON:  None Available. FINDINGS: Lower chest: Lung bases are clear. Hepatobiliary: Several simple fluid attenuation cystic lesions in the liver. Postcholecystectomy. No biliary duct dilatation. Common bile duct is normal. Pancreas: Pancreas is normal. No ductal dilatation. No pancreatic inflammation. Spleen: Normal spleen Adrenals/urinary tract: Adrenal glands normal. Nonobstructing calculus in the upper pole of the RIGHT kidney measures 5 mm. 3 mm calculus in lower pole of the RIGHT kidney. No ureterolithiasis or obstructive uropathy. Multiple bilateral renal cystic lesions which have Hounsfield units less than 20 consistent with simple fluid. No bladder calculi Stomach/Bowel: The stomach, duodenum, and small bowel normal. Diverticulosis of the descending aorta acute diverticulitis. Vascular/Lymphatic: Abdominal aorta is normal caliber with atherosclerotic calcification. There is no retroperitoneal or periportal lymphadenopathy. No pelvic lymphadenopathy. Reproductive: Prostate unremarkable Other: No free fluid. Musculoskeletal: Hemangioma within the L5 vertebral body IMPRESSION: 1. Two nonobstructing RIGHT renal calculi. No ureterolithiasis or obstructive uropathy. 2. No bladder calculi. 3. Multiple lesions right Bosniak II benign renal cyst.  No follow-up imaging is recommended. JACR 2018 Feb; 264-273, Management of the Incidental Renal Mass on CT, [employer] 2021; 814-848, Bosniak Classification of Cystic Renal Masses, Version 2019. 4. Benign-appearing hepatic cysts. Electronically Signed   By: Suzy Bouchard M.D.   On: 07/24/2022 10:02    Microbiology: Results for orders placed or performed during the hospital encounter of 07/26/22  MRSA Next Gen by PCR, Nasal     Status: None   Collection Time: 07/27/22 12:20 PM   Specimen: Nasal Mucosa; Nasal Swab  Result Value Ref Range Status   MRSA by PCR Next Gen NOT DETECTED NOT DETECTED Final    Comment: (NOTE) The GeneXpert MRSA Assay (FDA approved for NASAL specimens only), is one component of a comprehensive MRSA colonization surveillance program. It is not intended to diagnose MRSA infection nor to guide or monitor treatment for MRSA infections. Test performance is not FDA approved in patients less than 47 years old. Performed at [facility], [place] [address], [place], [place] [address]    Labs: CBC: Recent Labs  Lab 07/24/22 [phone] 07/26/22 2122 07/27/22 0445 07/28/22 0255  WBC 6.8 11.0* 9.7 8.5  NEUTROABS 3.4  --   --  4.7  HGB 15.3 15.1 14.3 13.5  HCT 46.7 46.3 44.4 43.1  MCV 93.0 94.7 95.1 96.4  PLT 235 241 212 518   Basic Metabolic Panel: Recent Labs  Lab 07/24/22 0927 07/26/22 2122 07/27/22 0445 07/28/22 0255  NA 138 139 136 133*  K 4.1 4.2 4.5 4.2  CL 106 109 107 105  CO2 _0 GLUCOSE 112* 130* 146* 112*  BUN _1 CREATININE 1.29* 1.25* 1.12 1.36*  CALCIUM 9.1 8.8* 8.6* 8.3*  MG  --   --   --  2.0  PHOS  --   --   --  3.7   Liver Function Tests: Recent Labs  Lab 07/28/22 0255  AST 13*  ALT 10  ALKPHOS 64  BILITOT 1.6*  PROT 6.4*  ALBUMIN 3.0*   CBG: No results for input(s): "GLUCAP" in the last 168 hours.  Discharge time spent: greater than 30 minutes.  Signed: Raiford Noble, DO [facility] 07/28/2022

## 2022-07-28 NOTE — Care Management Obs Status (Signed)
[place] NOTIFICATION   Patient Details  Name: Eric Chung MRN: [id number] Date of Birth: April 17, 1935   Medicare Observation Status Notification Given:  Yes    McGibboneyOletta Darter, RN 07/28/2022, 10:16 AM

## 2022-07-28 NOTE — Progress Notes (Addendum)
Rounding Note    Patient Name: Eric Chung Date of Encounter: 07/28/2022  [place] Cardiologist: Early Osmond, MD   Subjective   Patient reports feeling much better this AM. Denies any chest pain, sob. No chest pain on deep inhalation   Inpatient Medications    Scheduled Meds:  allopurinol  100 mg Oral BID   apixaban  2.5 mg Oral BID   aspirin EC  81 mg Oral Daily   atorvastatin  80 mg Oral Daily   Chlorhexidine Gluconate Cloth  6 each Topical Daily   colchicine  0.6 mg Oral BID   empagliflozin  10 mg Oral QAC breakfast   guaiFENesin  1,200 mg Oral BID   latanoprost  1 drop Both Eyes QHS   metoprolol succinate  12.5 mg Oral Daily   pantoprazole  40 mg Oral Daily   sacubitril-valsartan  1 tablet Oral BID   Continuous Infusions:  PRN Meds: acetaminophen, ipratropium, levalbuterol, morphine injection, nitroGLYCERIN, ondansetron (ZOFRAN) IV, mouth rinse, oxyCODONE, polyvinyl alcohol   Vital Signs    Vitals:   07/28/22 0620 07/28/22 0655 07/28/22 0700 07/28/22 0900  BP: (!) 101/57  (!) 92/48   Pulse: 73 (!) 57 (!) 57   Resp: _0 Temp:    97.8 F (36.6 C)  TempSrc:    Oral  SpO2: 93% 95% 95%   Weight:      Height:        Intake/Output Summary (Last 24 hours) at 07/28/2022 1012 Last data filed at 07/28/2022 0500 Gross per 24 hour  Intake 300 ml  Output 825 ml  Net -525 ml      07/27/2022   12:14 PM 07/24/2022    8:20 AM 05/04/2022    2:46 PM  Last 3 Weights  Weight (lbs) 229 lb 0.9 oz 231 lb 231 lb  Weight (kg) 103.9 kg 104.781 kg 104.781 kg      Telemetry    Normal sinus rhythm - Personally Reviewed  ECG     No new tracings- Personally Reviewed  Physical Exam   GEN: No acute distress.  Sitting upright in the bed eating breakfast  Neck: No JVD Cardiac: RRR, no murmurs, rubs, or gallops. Radial pulses 2+bilaterally   Respiratory: Clear to auscultation   GI: Soft, nontender, non-distended  MS: No edema; No  deformity. Neuro:  Nonfocal  Psych: Normal affect   Labs    High Sensitivity Troponin:   Recent Labs  Lab 07/26/22 2122 07/26/22 2245 07/27/22 0939 07/27/22 1146  TROPONINIHS 30* 29* 67* 62*     Chemistry Recent Labs  Lab 07/26/22 2122 07/27/22 0445 07/28/22 0255  NA 139 136 133*  K 4.2 4.5 4.2  CL 109 107 105  CO2 _1 GLUCOSE 130* 146* 112*  BUN _2 CREATININE 1.25* 1.12 1.36*  CALCIUM 8.8* 8.6* 8.3*  MG  --   --  2.0  PROT  --   --  6.4*  ALBUMIN  --   --  3.0*  AST  --   --  13*  ALT  --   --  10  ALKPHOS  --   --  64  BILITOT  --   --  1.6*  GFRNONAA 56* >60 51*  ANIONGAP _3 Lipids No results for input(s): "CHOL", "TRIG", "HDL", "LABVLDL", "LDLCALC", "CHOLHDL" in the last 168 hours.  Hematology Recent Labs  Lab 07/26/22 2122 07/27/22 0445 07/28/22 0255  WBC 11.0* 9.7 8.5  RBC 4.89 4.67 4.47  HGB 15.1 14.3 13.5  HCT 46.3 44.4 43.1  MCV 94.7 95.1 96.4  MCH 30.9 30.6 30.2  MCHC 32.6 32.2 31.3  RDW 14.6 14.8 14.9  PLT 241 212 214   Thyroid  Recent Labs  Lab 07/28/22 0255  TSH 1.100    BNPNo results for input(s): "BNP", "PROBNP" in the last 168 hours.  DDimer No results for input(s): "DDIMER" in the last 168 hours.   Radiology    CT CORONARY MORPH W/CTA COR W/SCORE W/CA W/CM &/OR WO/CM  Addendum Date: 07/28/2022   ADDENDUM REPORT: 07/28/2022 10:04 CLINICAL DATA:  87 yo male with cardiomyopathy and chest pain EXAM: Cardiac/Coronary CTA TECHNIQUE: A non-contrast, gated CT scan was obtained with axial slices of 3 mm through the heart for calcium scoring. Calcium scoring was performed using the Agatston method. A 120 kV prospective, gated, contrast cardiac scan was obtained. Gantry rotation speed was 250 msecs and collimation was 0.6 mm. Two sublingual nitroglycerin tablets (0.8 mg) were given. The 3D data set was reconstructed in 5% intervals of the 35-75% of the R-R cycle. Diastolic phases were analyzed on a dedicated workstation  using MPR, MIP, and VRT modes. The patient received 95 cc of contrast. FINDINGS: Image quality: Average. Noise artifact is: Limited. (signal-to-noise). Coronary Arteries:  Normal coronary origin.  Right dominance. Left main: The left main is a large caliber vessel with a normal take off from the left coronary cusp that trifurcates into a LAD, LCX, and ramus intermedius. There is minimal (0-24) soft and calcified plaque. Left anterior descending artery: The LAD has mild (25-49-low end) soft plaque in the proximal vessel. The LAD gives off 1 patent diagonal branch with minimal (0-24) plaque in the ostium. Ramus intermedius: Patent with no evidence of plaque or stenosis. Left circumflex artery: The LCX is non-dominant and patent with no evidence of plaque or stenosis. The LCX gives off 2 obtuse marginal branches. There is mild (25-49) soft plaque in the ostium of OM1. Right coronary artery: The RCA is dominant with normal take off from the right coronary cusp. There minimal (0-24) soft plaque in the proximal vessel and there is mild (25-49) soft plaque in the distal vessel. The RCA terminates as a PDA and right posterolateral branch without evidence of plaque or stenosis. Right Atrium: Right atrial size is within normal limits. Right Ventricle: The right ventricular cavity is within normal limits. Left Atrium: Left atrial size is normal in size with no left atrial appendage filling defect. Left Ventricle: The ventricular cavity size is within normal limits. There are no stigmata of prior infarction. There is no abnormal filling defect. Pulmonary arteries: Normal in size without proximal filling defect. Pulmonary veins: Normal pulmonary venous drainage. Pericardium: Normal thickness with no significant effusion or calcium present. Cardiac valves: The aortic valve is trileaflet without significant calcification. The mitral valve is normal structure without significant calcification. Aorta: Normal caliber with aortic  atherosclerosis. Extra-cardiac findings: See attached radiology report for non-cardiac structures. IMPRESSION: 1. Coronary calcium score of 41.1. This was 8 percentile for age-, sex, and race-matched controls. 2. Normal coronary origin with right dominance. 3. Mild (25-49) stenoses in the proximal LAD, OM1 and distal RCA. 4. Aortic atherosclerosis. RECOMMENDATIONS: CAD-RADS 2: Mild non-obstructive CAD (25-49%). Consider non-atherosclerotic causes of chest pain. Consider preventive therapy and risk factor modification. Kirk Ruths, MD Electronically Signed   By: Kirk Ruths M.D.   On: 07/28/2022 10:04   Result Date: 07/28/2022  EXAM: OVER-READ INTERPRETATION  CT CHEST The following report is a limited chest CT over-read performed by radiologist Dr. Zetta Bills of [facility] on 07/28/2022. This over-read does not include interpretation of cardiac or coronary anatomy or pathology. The coronary calcium and coronary CT angiography interpretation by the cardiologist is attached. COMPARISON:  July 26, 2022 FINDINGS: Vascular: See dedicated report for cardiovascular details. Mediastinum/Nodes: No adenopathy or acute process in the mediastinum. Lungs/Pleura: Lingular volume loss or scarring. Subtle areas of filling of the lower lobe bronchi. Mild interstitial prominence at the LEFT lung base with subpleural reticulation also present on the RIGHT is similar to prior imaging. Upper Abdomen: Incidental imaging of upper abdominal contents without acute findings. Assessment limited. Signs of hepatic cysts. Small hiatal hernia and patulous distal esophagus. Musculoskeletal: No acute bone finding or destructive bone process. IMPRESSION: 1. Question early interstitial lung disease versus sequela of prior aspiration. Correlate with any risk factors for or history of aspiration change. Consider follow-up high-resolution chest CT or swallowing evaluation as warranted on a nonemergent basis. 2. Small hiatal  hernia and patulous distal esophagus, may be indicative of esophageal dysmotility. Electronically Signed: By: Zetta Bills M.D. On: 07/28/2022 08:46   DG CHEST PORT 1 VIEW  Result Date: 07/28/2022 CLINICAL DATA:  Chest pain EXAM: PORTABLE CHEST 1 VIEW COMPARISON:  July 26, 2022 FINDINGS: EKG leads project over the chest. Cardiomediastinal contours and hilar structures are stable, heart size accentuated by AP projection may be mild to moderately enlarged. No lobar consolidation but with persistent airspace disease at the LEFT lung base, unchanged. No pneumothorax. On limited assessment no acute skeletal process. IMPRESSION: Persistent airspace disease at the LEFT lung base, unchanged. Findings likely reflecting volume loss/atelectasis. Electronically Signed   By: Zetta Bills M.D.   On: 07/28/2022 08:11   ECHOCARDIOGRAM COMPLETE  Result Date: 07/27/2022    ECHOCARDIOGRAM REPORT   Patient Name:   JAVARUS DORNER Date of Exam: 07/27/2022 Medical Rec #:  [id number]        Height:       71.0 in Accession #:    [id number]       Weight:       229.1 lb Date of Birth:  April 18, 1935        BSA:          2.234 m Patient Age:    87 years         BP:           109/64 mmHg Patient Gender: M                HR:           80 bpm. Exam Location:  Inpatient Procedure: 2D Echo, Cardiac Doppler, Color Doppler and Intracardiac            Opacification Agent Indications:    R07.9* Chest pain, unspecified  History:        Patient has prior history of Echocardiogram examinations, most                 recent 01/05/2022. Cardiomyopathy, Abnormal ECG, Arrythmias:PVC,                 Signs/Symptoms:Chest Pain; Risk Factors:Hypertension,                 Dyslipidemia and Sleep Apnea. Polysubstance abuse.  Sonographer:    Roseanna Rainbow RDCS Referring Phys: [id number] [facility]  Sonographer Comments: Technically difficult study due to poor echo windows.  Image acquisition challenging due to patient body habitus. IMPRESSIONS  1.  Exremelly poor acoustic windows llimit study, even with Definity use. No significant change in reported LVEF from previous echo. Left ventricular ejection fraction, by estimation, is 35 to 40%. The left ventricle has moderately decreased function. The left ventricular internal cavity size was mildly dilated.  2. Right ventricular systolic function is normal. The right ventricular size is normal.  3. The mitral valve is normal in structure. No evidence of mitral valve regurgitation.  4. The aortic valve is normal in structure. Aortic valve regurgitation is mild. FINDINGS  Left Ventricle: Exremelly poor acoustic windows llimit study, even with Definity use. No significant change in reported LVEF from previous echo. Left ventricular ejection fraction, by estimation, is 35 to 40%. The left ventricle has moderately decreased  function. The left ventricular internal cavity size was mildly dilated. There is no left ventricular hypertrophy. Right Ventricle: The right ventricular size is normal. Right vetricular wall thickness was not assessed. Right ventricular systolic function is normal. Left Atrium: Left atrial size was normal in size. Right Atrium: Right atrial size was normal in size. Pericardium: There is no evidence of pericardial effusion. Mitral Valve: The mitral valve is normal in structure. No evidence of mitral valve regurgitation. Tricuspid Valve: The tricuspid valve is normal in structure. Tricuspid valve regurgitation is trivial. Aortic Valve: The aortic valve is normal in structure. Aortic valve regurgitation is mild. Pulmonic Valve: The pulmonic valve was normal in structure. Pulmonic valve regurgitation is trivial. Aorta: The aortic root and ascending aorta are structurally normal, with no evidence of dilitation. Venous: The inferior vena cava was not well visualized. IAS/Shunts: No atrial level shunt detected by color flow Doppler.  LEFT VENTRICLE PLAX 2D LVIDd:         5.60 cm     Diastology LVIDs:          4.60 cm     LV e' medial:    12.00 cm/s LV PW:         1.20 cm     LV E/e' medial:  3.9 LV IVS:        0.90 cm     LV e' lateral:   7.53 cm/s LVOT diam:     2.70 cm     LV E/e' lateral: 6.2 LV SV:         92 LV SV Index:   41 LVOT Area:     5.73 cm  LV Volumes (MOD) LV vol d, MOD A2C: 78.4 ml LV vol d, MOD A4C: 85.4 ml LV vol s, MOD A2C: 39.9 ml LV vol s, MOD A4C: 59.7 ml LV SV MOD A2C:     38.5 ml LV SV MOD A4C:     85.4 ml LV SV MOD BP:      34.8 ml RIGHT VENTRICLE RV S prime:     8.15 cm/s TAPSE (M-mode): 2.0 cm LEFT ATRIUM           Index        RIGHT ATRIUM           Index LA diam:      2.60 cm 1.16 cm/m   RA Area:     16.30 cm LA Vol (A2C): 31.3 ml 14.01 ml/m  RA Volume:   39.10 ml  17.50 ml/m LA Vol (A4C): 32.1 ml 14.37 ml/m  AORTIC VALVE LVOT Vmax:   103.00 cm/s LVOT Vmean:  64.500 cm/s LVOT VTI:    0.161 m  AORTA Ao Root diam: 4.20 cm Ao Asc diam:  3.60 cm MITRAL VALVE MV Area (PHT): 3.40 cm    SHUNTS MV Decel Time: 223 msec    Systemic VTI:  0.16 m MV E velocity: 46.40 cm/s  Systemic Diam: 2.70 cm MV A velocity: 60.60 cm/s MV E/A ratio:  0.77 Dorris Carnes MD Electronically signed by Dorris Carnes MD Signature Date/Time: 07/27/2022/7:10:04 PM    Final    CT Angio Chest/Abd/Pel for Dissection W and/or Wo Contrast  Result Date: 07/26/2022 CLINICAL DATA:  Left chest and shoulder pain with diaphoresis. Acute aortic syndrome suspected. EXAM: CT ANGIOGRAPHY CHEST, ABDOMEN AND PELVIS TECHNIQUE: Noncontrast chest CT at 5 mm axial intervals to the adrenal glands was performed initially to assess for aortic hematoma. Multidetector CT imaging through the chest, abdomen and pelvis was performed using the standard protocol during bolus administration of intravenous contrast. Multiplanar reconstructed images and MIPs were obtained and reviewed to evaluate the vascular anatomy. RADIATION DOSE REDUCTION: This exam was performed according to the departmental dose-optimization program which includes automated exposure  control, adjustment of the mA and/or kV according to patient size and/or use of iterative reconstruction technique. CONTRAST:  138m OMNIPAQUE IOHEXOL 350 MG/ML SOLN COMPARISON:  Portable chest today, portable chest 05/23/2022, CT abdomen and pelvis no contrast recently 07/24/2022, CT abdomen and pelvis with contrast 08/27/2017. FINDINGS: CTA CHEST FINDINGS Cardiovascular: There is mild cardiomegaly with a left chamber predominance and trace three-vessel calcific CAD. No pericardial effusion. Veins are normal in caliber. Pulmonary arteries are upper-normal caliber with no central embolus. There is mild mixed calcific and soft plaque in the aortic arch and descending segment with mild descending tortuosity. The great vessels demonstrate minimal calcific plaque but are widely patent. There is no aortic aneurysm, dissection or stenosis. Mediastinum/Nodes: No enlarged mediastinal, hilar, or axillary lymph nodes. Thyroid gland, trachea, and esophagus demonstrate no significant findings. There is mild mediastinal lipomatosis and a small hiatal hernia. Lungs/Pleura: There is respiratory motion limiting evaluation of the lung fields. There is a low inspiration on exam. There is diffuse bronchial thickening. Posterior atelectasis noted in the lower lobes and diffuse mosaic lung attenuation consistent with small airways disease. No confluent pneumonia is seen or appreciable nodules. There is no pleural effusion, thickening or pneumothorax. Musculoskeletal: There is osteopenia with degenerative changes of the thoracic spine. Mild thoracic dextroscoliosis. Healed fracture deformity proximal right humerus and bilateral mild shoulder DJD. The ribcage is intact. No chest wall mass is seen. Review of the MIP images confirms the above findings. CTA ABDOMEN AND PELVIS FINDINGS VASCULAR Aorta: There are moderate mixed plaques without dissection, stenosis or aneurysm. Celiac: There is a 50% calcific stenosis in the proximal 6 mm of the  vessel with mild fusiform poststenotic ectasia, patchy calcification in the splenic artery without visible branch vessel stenosis. There is a normal variant left gastric artery origin from the abdominal aorta just above the celiac origin which has a 75% soft plaque origin stenosis and otherwise opacifies well. SMA: There is moderate mixed plaque up to and just distal to the inflection point of the vessel, and 3 cm distal to the vessel origin there is soft plaque causing a 60% focal stenosis. The SMA otherwise without flow-limiting stenosis, and no aneurysm or dissection. Renals: There are mild calcific plaques in the proximal renal arteries but no flow-limiting stenosis, aneurysm or dissection. On the left there is early takeoff of a hypoplastic upper pole artery without stenosis. IMA: 80% or greater calcific origin stenosis. Otherwise  the vessel opacifies well. Inflow: There are moderate patchy calcific/mixed plaques in the common iliac and internal iliac arteries, without flow-limiting narrowing. Both external iliac arteries show only minimal calcific plaque and no stenosis, aneurysm or dissection. The proximal outflow vessels are widely patent with mild calcifications in the common femoral arteries. Veins: Unopacified and not evaluated. The main portal vein and IVC are within normal caliber limits. Review of the MIP images confirms the above findings. NON-VASCULAR Hepatobiliary: 19 cm in length liver with mild general fatty replacement. There are scattered cysts, largest in the left lobe is 2.5 cm, largest on the right is in the anterior segment measuring 2 cm. Gallbladder is absent without biliary prominence. Pancreas: Partially atrophic, otherwise unremarkable. Spleen: No mass enhancement or splenomegaly. Adrenals/Urinary Tract: 4 mm and 3 mm nonobstructing caliceal stones are noted in the upper and lower right renal collecting system. Bilateral small parapelvic cysts and bilateral cortical renal cysts are  again shown, largest are in the right kidney measuring 5.4 cm laterally in the midpole and 3.2 cm in the lower pole. Cysts on the left are smaller, largest is a 2 cm parapelvic cyst. No mass enhancement is seen, no ureteral stones or hydronephrosis and no adrenal mass. The bladder is unremarkable for the degree of distention. Stomach/Bowel: No dilatation or wall thickening. An appendix is not seen in this patient. There is diffuse diverticulosis. No evidence of colitis or diverticulitis. Lymphatic: No adenopathy is seen. Reproductive: Prostatomegaly with transverse diameter 5.2 cm. Other: There is no free air, free hemorrhage, free fluid or acute inflammatory changes. Small supraumbilical, umbilical and inguinal fat hernias are noted without incarcerated hernia. Musculoskeletal: There is osteopenia with degenerative changes in the lumbar spine. Severe acquired spinal stenosis L4-5, moderate acquired spinal stenosis L2-3 and L3-4. No acute or other significant osseous findings. Review of the MIP images confirms the above findings. IMPRESSION: 1. Aortic and coronary artery atherosclerosis without aneurysm, dissection or stenosis. 2. Cardiomegaly without evidence of CHF. 3. Upper-normal pulmonary arteries without evidence of central embolus. 4. Bronchitis with mosaic lung attenuation consistent with small airways disease. 5. Small hiatal hernia. 6. Fatty liver with scattered cysts. 7. Nonobstructive nephrolithiasis. 8. Bilateral renal cysts. 9. Diverticulosis without evidence of diverticulitis. 10. Prostatomegaly. 11. Umbilical, supraumbilical and inguinal fat hernias. 12. Osteopenia and degenerative change with multilevel acquired spinal stenosis most severe at L4-5. Electronically Signed   By: Telford Nab M.D.   On: 07/26/2022 23:46   DG Chest Port 1 View  Result Date: 07/26/2022 CLINICAL DATA:  Chest pain EXAM: PORTABLE CHEST 1 VIEW COMPARISON:  Chest x-ray 05/23/2022 FINDINGS: Heart is enlarged, unchanged.  There is no focal lung infiltrate, pleural effusion or pneumothorax. No acute fractures are seen. IMPRESSION: No active disease. Electronically Signed   By: Ronney Asters M.D.   On: 07/26/2022 21:19    Cardiac Studies   CT coronary 07/28/22 IMPRESSION: 1. Coronary calcium score of 41.1. This was 8 percentile for age-, sex, and race-matched controls.   2. Normal coronary origin with right dominance.   3. Mild (25-49) stenoses in the proximal LAD, OM1 and distal RCA.   4. Aortic atherosclerosis.   RECOMMENDATIONS: CAD-RADS 2: Mild non-obstructive CAD (25-49%). Consider non-atherosclerotic causes of chest pain. Consider preventive therapy and risk factor modification.   Kirk Ruths, MD  Echocardiogram 07/27/22  1. Exremelly poor acoustic windows llimit study, even with Definity use.  No significant change in reported LVEF from previous echo. Left  ventricular ejection fraction, by estimation, is 35  to 40%. The left  ventricle has moderately decreased function.  The left ventricular internal cavity size was mildly dilated.   2. Right ventricular systolic function is normal. The right ventricular  size is normal.   3. The mitral valve is normal in structure. No evidence of mitral valve  regurgitation.   4. The aortic valve is normal in structure. Aortic valve regurgitation is  mild.   Patient Profile     87 y.o. male with a hx of DVT and PE on eliquis, GERD, HTN, HLD, OSA, chronic systolic heart failure who is being seen 07/27/2022 for the evaluation of chest pain at the request of Dr. Alfredia Ferguson   Assessment & Plan    Chest pain - Patient presented complaining of left-sided shoulder/chest pain.  Pain is waxing and waning, occasionally a dull tightness with episodes of intense, sharp pain.  Pain is worse with deep inhalation.  Chest wall tender to palpation - hsTn 30>>29>>67>>62 -CTA chest showed bronchitis with mosaic lung attenuation consistent with small airway disease, no  aortic dissection.  Also noted trace three-vessel calcific CAD -Coronary CT this AM with coronary calcium score of 41 (8th percentile), mild stenoses in the proximal LAD, OM1 and distal RCA. Study also noted a small hiatal hernia and patulous distal esophagus, may be indicative of esophageal dysmotility  - Chest pain concerning for possible pericarditis. However, ESR negative. Stop colchicine. Suspect this could have been an esophageal spasm.    Chronic systolic heart failure Nonischemic cardiomyopathy  - Most recent echocardiogram from 01/05/2022 showed EF 35-40%. Echo this admission with EF 35-40%  - Coronary CT with mild, nonobstructive disease  - Patient is euvolemic on exam -Continue Jardiance, metoprolol. Transitioned from losartan to entresto yesterday  - BP soft, unable to add spiro at this time    History of DVT, PE -Continue eliquis       For questions or updates, please contact [place] Please consult [email or web address] for contact info under        Signed, Margie Billet, PA-C  07/28/2022, 10:12 AM

## 2022-07-29 ENCOUNTER — Encounter: Payer: Self-pay | Admitting: Family Medicine

## 2022-07-29 ENCOUNTER — Non-Acute Institutional Stay (INDEPENDENT_AMBULATORY_CARE_PROVIDER_SITE_OTHER): Payer: Medicare PPO | Admitting: Family Medicine

## 2022-07-29 VITALS — BP 128/70 | HR 83 | Temp 98.1°F | Ht 71.0 in | Wt 231.0 lb

## 2022-07-29 DIAGNOSIS — R197 Diarrhea, unspecified: Secondary | ICD-10-CM

## 2022-07-29 DIAGNOSIS — R300 Dysuria: Secondary | ICD-10-CM | POA: Diagnosis not present

## 2022-07-29 DIAGNOSIS — I2699 Other pulmonary embolism without acute cor pulmonale: Secondary | ICD-10-CM | POA: Diagnosis not present

## 2022-07-29 DIAGNOSIS — K2270 Barrett's esophagus without dysplasia: Secondary | ICD-10-CM

## 2022-07-29 DIAGNOSIS — E669 Obesity, unspecified: Secondary | ICD-10-CM | POA: Diagnosis not present

## 2022-07-29 NOTE — Progress Notes (Signed)
Provider:  Alain Honey, MD  Careteam: Patient Care Team: Mast, Man X, NP as PCP - General (Internal Medicine) Early Osmond, MD as PCP - Cardiology (Cardiology)  PLACE OF SERVICE:  [facility]  Advanced Directive information    Allergies  Allergen Reactions   Penicillins     Has patient had a PCN reaction causing immediate rash, facial/tongue/throat swelling, SOB or lightheadedness with hypotension: yes, rash Has patient had a PCN reaction causing severe rash involving mucus membranes or skin necrosis: no Has patient had a PCN reaction that required hospitalization: no Has patient had a PCN reaction occurring within the last 10 years: No If all of the above answers are "NO", then may proceed with Cephalosporin use.     Chief Complaint  Patient presents with   Acute Visit    Patient presents today for diarrhea for several months off/ on. He reports taking pepto bismol and its not helping.     HPI: Patient is a 87 y.o. male .  Patient present today requesting referral to gastroenterology to evaluate diarrhea.  He was recently hospitalized for some chest pain.  Coronary syndrome was ruled out although he did have some nonobstructive lesions on cardiac score.  He was started on several new medicines and was thinking these may be contributing to his diarrhea but symptoms actually predate the new medicines by several months.  He has not had any cramping, blood in the diarrhea, or weight loss.  He tells me he can eat most anything and has not bothered by dairy products.  There is been no recent travel.  He did take some cephalexin for a few days for some dysuria with negative culture but again symptoms of diarrhea predated the use of antibiotics past several months. He does also complain of some intermittent dysuria.  Also endorses not drinking enough fluids and showing up on concentrated urine and specific gravity and previous testing.  Review of Systems:  Review of Systems   Constitutional: Negative.   HENT: Negative.    Respiratory: Negative.    Cardiovascular:  Positive for chest pain.  Gastrointestinal:  Positive for heartburn.  Genitourinary:  Positive for dysuria.  Musculoskeletal: Negative.   Skin: Negative.   Neurological: Negative.   Psychiatric/Behavioral: Negative.      Past Medical History:  Diagnosis Date   Apnea, sleep    Barrett's esophagus    Gout    Hypertension    Obesity    Pulmonary emboli [facility])    Past Surgical History:  Procedure Laterality Date   CHOLECYSTECTOMY  08/29/2017   EYE SURGERY Bilateral 2011-2012   catracts removed   HERNIA REPAIR  2005   T. Carlton Adam MD   TONSILECTOMY/ADENOIDECTOMY WITH MYRINGOTOMY  1944   Social History:   reports that he quit smoking about 37 years ago. His smoking use included cigarettes. He has a 15.00 pack-year smoking history. He has never used smokeless tobacco. He reports current alcohol use of about 3.0 - 4.0 standard drinks of alcohol per week. He reports that he does not use drugs.  Family History  Problem Relation Age of Onset   Osteoporosis Mother    Cancer Father 64       esophagus   Crohn's disease Sister    Birth defects Maternal Grandmother        colon   Diabetes Paternal Grandmother     Medications: Patient's Medications  New Prescriptions   No medications on file  Previous Medications   ALLOPURINOL (  ZYLOPRIM) 100 MG TABLET    TAKE ONE TABLET BY MOUTH TWICE DAILY   APIXABAN (ELIQUIS) 2.5 MG TABS TABLET    Take 2.5 mg by mouth 2 (two) times a day.    ASPIRIN EC 81 MG TABLET    Take 1 tablet (81 mg total) by mouth daily. Swallow whole.   ATORVASTATIN (LIPITOR) 80 MG TABLET    Take 1 tablet (80 mg total) by mouth daily.   EMPAGLIFLOZIN (JARDIANCE) 10 MG TABS TABLET    Take 1 tablet (10 mg total) by mouth daily before breakfast.   FLUTICASONE (FLONASE) 50 MCG/ACT NASAL SPRAY    Place 1 spray into both nostrils daily.    GUAIFENESIN (MUCINEX) 600 MG 12 HR TABLET    Take  1 tablet (600 mg total) by mouth 2 (two) times daily for 5 days.   HYDROCODONE-ACETAMINOPHEN (NORCO/VICODIN) 5-325 MG TABLET    Take 1-2 tablets by mouth every 6 (six) hours as needed.   LATANOPROST (XALATAN) 0.005 % OPHTHALMIC SOLUTION    Place 1 drop into both eyes at bedtime.   METOPROLOL SUCCINATE (TOPROL XL) 25 MG 24 HR TABLET    Take 0.5 tablets (12.5 mg total) by mouth daily.   MULTIPLE VITAMINS-MINERALS (MULTIVITAMIN WITH MINERALS) TABLET    Take 1 tablet by mouth daily.   NITROGLYCERIN (NITROSTAT) 0.4 MG SL TABLET    Place 1 tablet (0.4 mg total) under the tongue every 5 (five) minutes as needed for chest pain.   ONDANSETRON (ZOFRAN-ODT) 4 MG DISINTEGRATING TABLET    Take 1 tablet (4 mg total) by mouth every 8 (eight) hours as needed for nausea or vomiting.   PANTOPRAZOLE (PROTONIX) 40 MG TABLET    Take 1 tablet (40 mg total) by mouth daily.   POLYVINYL ALCOHOL (LIQUIFILM TEARS) 1.4 % OPHTHALMIC SOLUTION    Place 1 drop into both eyes as needed for dry eyes.   SACUBITRIL-VALSARTAN (ENTRESTO) 24-26 MG    Take 1 tablet by mouth 2 (two) times daily.   SUMATRIPTAN (IMITREX) 50 MG TABLET    TAKE 1 TABLET AS NEEDED FOR MIGRAINE.  Modified Medications   No medications on file  Discontinued Medications   CEPHALEXIN (KEFLEX) 500 MG CAPSULE    Take 1 capsule (500 mg total) by mouth 2 (two) times daily for 5 days.    Physical Exam:  Vitals:   07/29/22 1509  BP: 128/70  Pulse: 83  Temp: 98.1 F (36.7 C)  SpO2: 94%  Weight: 231 lb (104.8 kg)  Height: '5\' 11"'$  (1.803 m)   Body mass index is 32.22 kg/m. Wt Readings from Last 3 Encounters:  07/29/22 231 lb (104.8 kg)  07/27/22 229 lb 0.9 oz (103.9 kg)  07/24/22 231 lb (104.8 kg)    Physical Exam Vitals and nursing note reviewed.  Constitutional:      Appearance: He is obese.  Cardiovascular:     Rate and Rhythm: Normal rate and regular rhythm.  Pulmonary:     Effort: Pulmonary effort is normal.     Breath sounds: Normal breath  sounds.  Abdominal:     General: Bowel sounds are normal.     Palpations: Abdomen is soft.     Tenderness: There is no rebound.  Neurological:     General: No focal deficit present.     Mental Status: He is alert and oriented to person, place, and time.     Labs reviewed: Basic Metabolic Panel: Recent Labs    07/26/22 2122 07/27/22 0445 07/28/22  0255  NA 139 136 133*  K 4.2 4.5 4.2  CL 109 107 105  CO2 '23 23 22  '$ GLUCOSE 130* 146* 112*  BUN '16 17 23  '$ CREATININE 1.25* 1.12 1.36*  CALCIUM 8.8* 8.6* 8.3*  MG  --   --  2.0  PHOS  --   --  3.7  TSH  --   --  1.100   Liver Function Tests: Recent Labs    05/23/22 1304 07/28/22 0255  AST 20 13*  ALT 14 10  ALKPHOS 79 64  BILITOT 1.0 1.6*  PROT 7.4 6.4*  ALBUMIN 4.0 3.0*   No results for input(s): "LIPASE", "AMYLASE" in the last 8760 hours. No results for input(s): "AMMONIA" in the last 8760 hours. CBC: Recent Labs    07/24/22 0927 07/26/22 2122 07/27/22 0445 07/28/22 0255  WBC 6.8 11.0* 9.7 8.5  NEUTROABS 3.4  --   --  4.7  HGB 15.3 15.1 14.3 13.5  HCT 46.7 46.3 44.4 43.1  MCV 93.0 94.7 95.1 96.4  PLT 235 241 212 214   Lipid Panel: No results for input(s): "CHOL", "HDL", "LDLCALC", "TRIG", "CHOLHDL", "LDLDIRECT" in the last 8760 hours. TSH: Recent Labs    07/28/22 0255  TSH 1.100   A1C: Lab Results  Component Value Date   HGBA1C 5.9 (H) 10/05/2019     Assessment/Plan  1. Diarrhea, unspecified type Diarrhea syndrome is nonspecific.  Patient has requested to see gastroenterology for evaluation so we will make referral.  In the meantime I have asked him to try some Align, a probiotic  2. Other acute pulmonary embolism without acute cor pulmonale ([place]) He continues to take Eliquis for PE  3. Barrett's esophagus without dysplasia Continues to take a PPI  4. Dysuria Negative urine culture from 07/24/2022.  I have asked him to continue to try to drink more water.  May also pick up some Pyridium for  symptomatic relief of dysuria  5. Obesity (BMI 30-39.9) Needs to lose weight.  Have recommended curbing some carbohydrate intake   Alain Honey, MD [place] [phone]

## 2022-07-29 NOTE — Patient Instructions (Signed)
Going to referral to GI. Take a probiotic daily

## 2022-07-30 LAB — HIGH SENSITIVITY CRP: CRP, High Sensitivity: 103.27 mg/L — ABNORMAL HIGH (ref 0.00–3.00)

## 2022-07-31 DIAGNOSIS — M6281 Muscle weakness (generalized): Secondary | ICD-10-CM | POA: Diagnosis not present

## 2022-07-31 DIAGNOSIS — R29898 Other symptoms and signs involving the musculoskeletal system: Secondary | ICD-10-CM | POA: Diagnosis not present

## 2022-08-03 ENCOUNTER — Other Ambulatory Visit (HOSPITAL_COMMUNITY): Payer: Medicare PPO

## 2022-08-05 ENCOUNTER — Encounter: Payer: Self-pay | Admitting: Primary Care

## 2022-08-05 ENCOUNTER — Ambulatory Visit: Payer: Medicare PPO | Admitting: Primary Care

## 2022-08-05 VITALS — BP 122/58 | HR 50 | Temp 98.9°F | Ht 71.0 in | Wt 225.2 lb

## 2022-08-05 DIAGNOSIS — I8290 Acute embolism and thrombosis of unspecified vein: Secondary | ICD-10-CM | POA: Diagnosis not present

## 2022-08-05 DIAGNOSIS — R053 Chronic cough: Secondary | ICD-10-CM | POA: Insufficient documentation

## 2022-08-05 DIAGNOSIS — R001 Bradycardia, unspecified: Secondary | ICD-10-CM | POA: Diagnosis not present

## 2022-08-05 DIAGNOSIS — I10 Essential (primary) hypertension: Secondary | ICD-10-CM | POA: Diagnosis not present

## 2022-08-05 DIAGNOSIS — K219 Gastro-esophageal reflux disease without esophagitis: Secondary | ICD-10-CM

## 2022-08-05 DIAGNOSIS — R071 Chest pain on breathing: Secondary | ICD-10-CM | POA: Diagnosis not present

## 2022-08-05 NOTE — Assessment & Plan Note (Signed)
-   Stable; BP 122/58 - Continue Entresto 24-'26mg'$  one tablet twice daily

## 2022-08-05 NOTE — Progress Notes (Signed)
_0  ID: Kason Benak, male    DOB: Oct 05, 1934, 87 y.o.   MRN: [id number]  Chief Complaint  Patient presents with   Hospitalization Follow-up    07/26/2022 PT Stated his pulse has been low 61 DOE    Referring provider: Mast, Man X, NP  HPI: 87 year old male, former smoker. PMH significant for HTN, acute PE without cor pulmonale, venous thromboembolism, stage 2 kidney disease.   08/05/2022 Patient presents today for hospital follow-up.   He was admitted to the hospital from 07/26/22-07/28/22 for chest pain felt to be pleuritic with component of pericarditis.  Patient developed severe left-sided chest pain and shoulder pain.  He was given 3 nitros in the emergency room.  He had an abnormal EKG but no real acute ischemic changes.  Cardiology was consulted.  They felt pain was consistent with pleuritic chest pain however could have some component of pericarditis.  They obtained coronary CT as well as echocardiogram and started patient on NSAIDs and colchicine.  CT showed coronary calcium score 41 with 25 to 49% pLAD/01 and D RCA stenosis and aortic arthrosclerosis.  He had some possible early signs of ILD versus aspiration with a small hiatal hernia.  ESR was normal and CRP was slightly elevated.  No evidence of pericarditis effusion on echocardiogram.  High-sensitivity troponin was mildly elevated but flat and not consistent with acute coronary syndrome, he had mild nonobstructive CAD on CT scan.  Cardiology recommended patient be started on aspirin 81 mg, low-dose beta-blocker with atorvastatin.  He was also placed on max GDMT for nonischemic cardiomyopathy.  He improved significantly and was weaned off oxygen.  GI recommended outpatient follow-up.  SLP evaluation recommended regular diet.  Appointment made with pulmonary as he will need outpatient high-resolution CT.  He is doing ok today. Reports feeling at times that he is going to pass out. He has had no LOC episodes. He tells me that  it happens after he takes [place]. BP is 122/58 and pulse was 50. He did not take some of his cardiac medications today. Breathing wise he is doing alright. He has no shortness of breath symptoms. He has a chronic cough, mostly dry. He will get up some green mucus in the morning. Hansel Starling will cause reflux symptoms at night which causes coughing and mucus. Denies active chest pain.    Allergies  Allergen Reactions   Penicillins     Has patient had a PCN reaction causing immediate rash, facial/tongue/throat swelling, SOB or lightheadedness with hypotension: yes, rash Has patient had a PCN reaction causing severe rash involving mucus membranes or skin necrosis: no Has patient had a PCN reaction that required hospitalization: no Has patient had a PCN reaction occurring within the last 10 years: No If all of the above answers are "NO", then may proceed with Cephalosporin use.     Immunization History  Administered Date(s) Administered   Fluad Quad(high Dose 65+) 06/07/2019, 06/09/2021   Influenza Whole 05/26/2018   Influenza, High Dose Seasonal PF 06/02/2017, 06/05/2020   Influenza-Unspecified 06/24/2016   Moderna Sars-Covid-2 Vaccination 08/28/2019, 09/25/2019, 07/02/2020   Pneumococcal Conjugate-13 09/12/2014   Pneumococcal Polysaccharide-23 12/17/2016   Pneumococcal-Unspecified 12/17/2016   Td 07/31/2016   Zoster Recombinat (Shingrix) 08/24/2005, 04/18/2019, 07/04/2019    Past Medical History:  Diagnosis Date   Apnea, sleep    Barrett's esophagus    Gout    Hypertension    Obesity    Pulmonary emboli ([place])     Tobacco History: Social History  Tobacco Use  Smoking Status Former   Packs/day: 1.00   Years: 15.00   Total pack years: 15.00   Types: Cigarettes   Quit date: 08/24/1984   Years since quitting: 37.9  Smokeless Tobacco Never   Counseling given: Not Answered   Outpatient Medications Prior to Visit  Medication Sig Dispense Refill   allopurinol (ZYLOPRIM) 100  MG tablet TAKE ONE TABLET BY MOUTH TWICE DAILY (Patient taking differently: Take 100 mg by mouth 2 (two) times daily.) 180 tablet 1   apixaban (ELIQUIS) 2.5 MG TABS tablet Take 2.5 mg by mouth 2 (two) times a day.      aspirin EC 81 MG tablet Take 1 tablet (81 mg total) by mouth daily. Swallow whole. 30 tablet 12   atorvastatin (LIPITOR) 80 MG tablet Take 1 tablet (80 mg total) by mouth daily. 30 tablet 0   empagliflozin (JARDIANCE) 10 MG TABS tablet Take 1 tablet (10 mg total) by mouth daily before breakfast. 90 tablet 3   fluticasone (FLONASE) 50 MCG/ACT nasal spray Place 1 spray into both nostrils daily.      HYDROcodone-acetaminophen (NORCO/VICODIN) 5-325 MG tablet Take 1-2 tablets by mouth every 6 (six) hours as needed. (Patient taking differently: Take 1-2 tablets by mouth every 6 (six) hours as needed for moderate pain.) 10 tablet 0   latanoprost (XALATAN) 0.005 % ophthalmic solution Place 1 drop into both eyes at bedtime.     metoprolol succinate (TOPROL XL) 25 MG 24 hr tablet Take 0.5 tablets (12.5 mg total) by mouth daily. (Patient taking differently: Take 12.5 mg by mouth at bedtime.) 45 tablet 3   Multiple Vitamins-Minerals (MULTIVITAMIN WITH MINERALS) tablet Take 1 tablet by mouth daily.     nitroGLYCERIN (NITROSTAT) 0.4 MG SL tablet Place 1 tablet (0.4 mg total) under the tongue every 5 (five) minutes as needed for chest pain. 30 tablet 12   ondansetron (ZOFRAN-ODT) 4 MG disintegrating tablet Take 1 tablet (4 mg total) by mouth every 8 (eight) hours as needed for nausea or vomiting. 10 tablet 0   pantoprazole (PROTONIX) 40 MG tablet Take 1 tablet (40 mg total) by mouth daily. 30 tablet 0   polyvinyl alcohol (LIQUIFILM TEARS) 1.4 % ophthalmic solution Place 1 drop into both eyes as needed for dry eyes. 15 mL 0   sacubitril-valsartan (ENTRESTO) 24-26 MG Take 1 tablet by mouth 2 (two) times daily. 60 tablet 0   SUMAtriptan (IMITREX) 50 MG tablet TAKE 1 TABLET AS NEEDED FOR MIGRAINE.  (Patient taking differently: Take 50 mg by mouth every 2 (two) hours as needed for migraine.) 10 tablet 0   No facility-administered medications prior to visit.   Review of Systems  Review of Systems  Constitutional: Negative.   Respiratory:  Positive for cough. Negative for chest tightness, shortness of breath and wheezing.   Cardiovascular:  Negative for chest pain.  Neurological:  Positive for light-headedness.     Physical Exam  BP (!) 122/58 (BP Location: Right Arm, Patient Position: Sitting, Cuff Size: Large)   Pulse (!) 50   Temp 98.9 F (37.2 C) (Oral)   Ht _0  (1.803 m)   Wt 225 lb 3.2 oz (102.2 kg)   SpO2 94%   BMI 31.41 kg/m  Physical Exam Constitutional:      Appearance: Normal appearance.  HENT:     Head: Normocephalic and atraumatic.     Mouth/Throat:     Mouth: Mucous membranes are moist.     Pharynx: Oropharynx is clear.  Cardiovascular:  Rate and Rhythm: Normal rate and regular rhythm.  Pulmonary:     Effort: Pulmonary effort is normal. No respiratory distress.     Breath sounds: Rales present.     Comments: Rales L>R Musculoskeletal:        General: Normal range of motion.  Skin:    General: Skin is warm and dry.  Neurological:     General: No focal deficit present.     Mental Status: He is alert and oriented to person, place, and time. Mental status is at baseline.  Psychiatric:        Mood and Affect: Mood normal.        Behavior: Behavior normal.        Thought Content: Thought content normal.        Judgment: Judgment normal.      Lab Results:  CBC    Component Value Date/Time   WBC 8.5 07/28/2022 0255   RBC 4.47 07/28/2022 0255   HGB 13.5 07/28/2022 0255   HCT 43.1 07/28/2022 0255   PLT 214 07/28/2022 0255   MCV 96.4 07/28/2022 0255   MCH 30.2 07/28/2022 0255   MCHC 31.3 07/28/2022 0255   RDW 14.9 07/28/2022 0255   LYMPHSABS 2.5 07/28/2022 0255   MONOABS 0.9 07/28/2022 0255   EOSABS 0.3 07/28/2022 0255   BASOSABS 0.0  07/28/2022 0255    BMET    Component Value Date/Time   NA 133 (L) 07/28/2022 0255   NA 140 10/13/2021 1050   K 4.2 07/28/2022 0255   CL 105 07/28/2022 0255   CO2 22 07/28/2022 0255   GLUCOSE 112 (H) 07/28/2022 0255   BUN 23 07/28/2022 0255   BUN 16 10/13/2021 1050   CREATININE 1.36 (H) 07/28/2022 0255   CREATININE 1.11 10/05/2019 0700   CALCIUM 8.3 (L) 07/28/2022 0255   GFRNONAA 51 (L) 07/28/2022 0255   GFRNONAA 62 10/05/2019 0700   GFRAA 71 10/05/2019 0700    BNP    Component Value Date/Time   BNP 64.0 08/27/2017 0350    ProBNP No results found for: "PROBNP"  Imaging: CT CORONARY MORPH W/CTA COR W/SCORE W/CA W/CM &/OR WO/CM  Addendum Date: 07/28/2022   ADDENDUM REPORT: 07/28/2022 10:04 CLINICAL DATA:  87 yo male with cardiomyopathy and chest pain EXAM: Cardiac/Coronary CTA TECHNIQUE: A non-contrast, gated CT scan was obtained with axial slices of 3 mm through the heart for calcium scoring. Calcium scoring was performed using the Agatston method. A 120 kV prospective, gated, contrast cardiac scan was obtained. Gantry rotation speed was 250 msecs and collimation was 0.6 mm. Two sublingual nitroglycerin tablets (0.8 mg) were given. The 3D data set was reconstructed in 5% intervals of the 35-75% of the R-R cycle. Diastolic phases were analyzed on a dedicated workstation using MPR, MIP, and VRT modes. The patient received 95 cc of contrast. FINDINGS: Image quality: Average. Noise artifact is: Limited. (signal-to-noise). Coronary Arteries:  Normal coronary origin.  Right dominance. Left main: The left main is a large caliber vessel with a normal take off from the left coronary cusp that trifurcates into a LAD, LCX, and ramus intermedius. There is minimal (0-24) soft and calcified plaque. Left anterior descending artery: The LAD has mild (25-49-low end) soft plaque in the proximal vessel. The LAD gives off 1 patent diagonal branch with minimal (0-24) plaque in the ostium. Ramus  intermedius: Patent with no evidence of plaque or stenosis. Left circumflex artery: The LCX is non-dominant and patent with no evidence of plaque or stenosis.  The LCX gives off 2 obtuse marginal branches. There is mild (25-49) soft plaque in the ostium of OM1. Right coronary artery: The RCA is dominant with normal take off from the right coronary cusp. There minimal (0-24) soft plaque in the proximal vessel and there is mild (25-49) soft plaque in the distal vessel. The RCA terminates as a PDA and right posterolateral branch without evidence of plaque or stenosis. Right Atrium: Right atrial size is within normal limits. Right Ventricle: The right ventricular cavity is within normal limits. Left Atrium: Left atrial size is normal in size with no left atrial appendage filling defect. Left Ventricle: The ventricular cavity size is within normal limits. There are no stigmata of prior infarction. There is no abnormal filling defect. Pulmonary arteries: Normal in size without proximal filling defect. Pulmonary veins: Normal pulmonary venous drainage. Pericardium: Normal thickness with no significant effusion or calcium present. Cardiac valves: The aortic valve is trileaflet without significant calcification. The mitral valve is normal structure without significant calcification. Aorta: Normal caliber with aortic atherosclerosis. Extra-cardiac findings: See attached radiology report for non-cardiac structures. IMPRESSION: 1. Coronary calcium score of 41.1. This was 8 percentile for age-, sex, and race-matched controls. 2. Normal coronary origin with right dominance. 3. Mild (25-49) stenoses in the proximal LAD, OM1 and distal RCA. 4. Aortic atherosclerosis. RECOMMENDATIONS: CAD-RADS 2: Mild non-obstructive CAD (25-49%). Consider non-atherosclerotic causes of chest pain. Consider preventive therapy and risk factor modification. Kirk Ruths, MD Electronically Signed   By: Kirk Ruths M.D.   On: 07/28/2022 10:04    Result Date: 07/28/2022 EXAM: OVER-READ INTERPRETATION  CT CHEST The following report is a limited chest CT over-read performed by radiologist Dr. Zetta Bills of [facility], [place] on 07/28/2022. This over-read does not include interpretation of cardiac or coronary anatomy or pathology. The coronary calcium and coronary CT angiography interpretation by the cardiologist is attached. COMPARISON:  July 26, 2022 FINDINGS: Vascular: See dedicated report for cardiovascular details. Mediastinum/Nodes: No adenopathy or acute process in the mediastinum. Lungs/Pleura: Lingular volume loss or scarring. Subtle areas of filling of the lower lobe bronchi. Mild interstitial prominence at the LEFT lung base with subpleural reticulation also present on the RIGHT is similar to prior imaging. Upper Abdomen: Incidental imaging of upper abdominal contents without acute findings. Assessment limited. Signs of hepatic cysts. Small hiatal hernia and patulous distal esophagus. Musculoskeletal: No acute bone finding or destructive bone process. IMPRESSION: 1. Question early interstitial lung disease versus sequela of prior aspiration. Correlate with any risk factors for or history of aspiration change. Consider follow-up high-resolution chest CT or swallowing evaluation as warranted on a nonemergent basis. 2. Small hiatal hernia and patulous distal esophagus, may be indicative of esophageal dysmotility. Electronically Signed: By: Zetta Bills M.D. On: 07/28/2022 08:46   DG CHEST PORT 1 VIEW  Result Date: 07/28/2022 CLINICAL DATA:  Chest pain EXAM: PORTABLE CHEST 1 VIEW COMPARISON:  July 26, 2022 FINDINGS: EKG leads project over the chest. Cardiomediastinal contours and hilar structures are stable, heart size accentuated by AP projection may be mild to moderately enlarged. No lobar consolidation but with persistent airspace disease at the LEFT lung base, unchanged. No pneumothorax. On limited assessment no acute skeletal  process. IMPRESSION: Persistent airspace disease at the LEFT lung base, unchanged. Findings likely reflecting volume loss/atelectasis. Electronically Signed   By: Zetta Bills M.D.   On: 07/28/2022 08:11   ECHOCARDIOGRAM COMPLETE  Result Date: 07/27/2022    ECHOCARDIOGRAM REPORT   Patient Name:   MOE  Finnan Date of Exam: 07/27/2022 Medical Rec #:  [id number]        Height:       71.0 in Accession #:    [id number]       Weight:       229.1 lb Date of Birth:  04-22-1935        BSA:          2.234 m Patient Age:    40 years         BP:           109/64 mmHg Patient Gender: M                HR:           80 bpm. Exam Location:  Inpatient Procedure: 2D Echo, Cardiac Doppler, Color Doppler and Intracardiac            Opacification Agent Indications:    R07.9* Chest pain, unspecified  History:        Patient has prior history of Echocardiogram examinations, most                 recent 01/05/2022. Cardiomyopathy, Abnormal ECG, Arrythmias:PVC,                 Signs/Symptoms:Chest Pain; Risk Factors:Hypertension,                 Dyslipidemia and Sleep Apnea. Polysubstance abuse.  Sonographer:    Roseanna Rainbow RDCS Referring Phys: [id number] [facility]  Sonographer Comments: Technically difficult study due to poor echo windows. Image acquisition challenging due to patient body habitus. IMPRESSIONS  1. Exremelly poor acoustic windows llimit study, even with Definity use. No significant change in reported LVEF from previous echo. Left ventricular ejection fraction, by estimation, is 35 to 40%. The left ventricle has moderately decreased function. The left ventricular internal cavity size was mildly dilated.  2. Right ventricular systolic function is normal. The right ventricular size is normal.  3. The mitral valve is normal in structure. No evidence of mitral valve regurgitation.  4. The aortic valve is normal in structure. Aortic valve regurgitation is mild. FINDINGS  Left Ventricle: Exremelly poor acoustic windows  llimit study, even with Definity use. No significant change in reported LVEF from previous echo. Left ventricular ejection fraction, by estimation, is 35 to 40%. The left ventricle has moderately decreased  function. The left ventricular internal cavity size was mildly dilated. There is no left ventricular hypertrophy. Right Ventricle: The right ventricular size is normal. Right vetricular wall thickness was not assessed. Right ventricular systolic function is normal. Left Atrium: Left atrial size was normal in size. Right Atrium: Right atrial size was normal in size. Pericardium: There is no evidence of pericardial effusion. Mitral Valve: The mitral valve is normal in structure. No evidence of mitral valve regurgitation. Tricuspid Valve: The tricuspid valve is normal in structure. Tricuspid valve regurgitation is trivial. Aortic Valve: The aortic valve is normal in structure. Aortic valve regurgitation is mild. Pulmonic Valve: The pulmonic valve was normal in structure. Pulmonic valve regurgitation is trivial. Aorta: The aortic root and ascending aorta are structurally normal, with no evidence of dilitation. Venous: The inferior vena cava was not well visualized. IAS/Shunts: No atrial level shunt detected by color flow Doppler.  LEFT VENTRICLE PLAX 2D LVIDd:         5.60 cm     Diastology LVIDs:         4.60 cm  LV e' medial:    12.00 cm/s LV PW:         1.20 cm     LV E/e' medial:  3.9 LV IVS:        0.90 cm     LV e' lateral:   7.53 cm/s LVOT diam:     2.70 cm     LV E/e' lateral: 6.2 LV SV:         92 LV SV Index:   41 LVOT Area:     5.73 cm  LV Volumes (MOD) LV vol d, MOD A2C: 78.4 ml LV vol d, MOD A4C: 85.4 ml LV vol s, MOD A2C: 39.9 ml LV vol s, MOD A4C: 59.7 ml LV SV MOD A2C:     38.5 ml LV SV MOD A4C:     85.4 ml LV SV MOD BP:      34.8 ml RIGHT VENTRICLE RV S prime:     8.15 cm/s TAPSE (M-mode): 2.0 cm LEFT ATRIUM           Index        RIGHT ATRIUM           Index LA diam:      2.60 cm 1.16 cm/m   RA  Area:     16.30 cm LA Vol (A2C): 31.3 ml 14.01 ml/m  RA Volume:   39.10 ml  17.50 ml/m LA Vol (A4C): 32.1 ml 14.37 ml/m  AORTIC VALVE LVOT Vmax:   103.00 cm/s LVOT Vmean:  64.500 cm/s LVOT VTI:    0.161 m  AORTA Ao Root diam: 4.20 cm Ao Asc diam:  3.60 cm MITRAL VALVE MV Area (PHT): 3.40 cm    SHUNTS MV Decel Time: 223 msec    Systemic VTI:  0.16 m MV E velocity: 46.40 cm/s  Systemic Diam: 2.70 cm MV A velocity: 60.60 cm/s MV E/A ratio:  0.77 Dorris Carnes MD Electronically signed by Dorris Carnes MD Signature Date/Time: 07/27/2022/7:10:04 PM    Final    CT Angio Chest/Abd/Pel for Dissection W and/or Wo Contrast  Result Date: 07/26/2022 CLINICAL DATA:  Left chest and shoulder pain with diaphoresis. Acute aortic syndrome suspected. EXAM: CT ANGIOGRAPHY CHEST, ABDOMEN AND PELVIS TECHNIQUE: Noncontrast chest CT at 5 mm axial intervals to the adrenal glands was performed initially to assess for aortic hematoma. Multidetector CT imaging through the chest, abdomen and pelvis was performed using the standard protocol during bolus administration of intravenous contrast. Multiplanar reconstructed images and MIPs were obtained and reviewed to evaluate the vascular anatomy. RADIATION DOSE REDUCTION: This exam was performed according to the departmental dose-optimization program which includes automated exposure control, adjustment of the mA and/or kV according to patient size and/or use of iterative reconstruction technique. CONTRAST:  18m OMNIPAQUE IOHEXOL 350 MG/ML SOLN COMPARISON:  Portable chest today, portable chest 05/23/2022, CT abdomen and pelvis no contrast recently 07/24/2022, CT abdomen and pelvis with contrast 08/27/2017. FINDINGS: CTA CHEST FINDINGS Cardiovascular: There is mild cardiomegaly with a left chamber predominance and trace three-vessel calcific CAD. No pericardial effusion. Veins are normal in caliber. Pulmonary arteries are upper-normal caliber with no central embolus. There is mild mixed calcific  and soft plaque in the aortic arch and descending segment with mild descending tortuosity. The great vessels demonstrate minimal calcific plaque but are widely patent. There is no aortic aneurysm, dissection or stenosis. Mediastinum/Nodes: No enlarged mediastinal, hilar, or axillary lymph nodes. Thyroid gland, trachea, and esophagus demonstrate no significant findings. There is mild mediastinal lipomatosis  and a small hiatal hernia. Lungs/Pleura: There is respiratory motion limiting evaluation of the lung fields. There is a low inspiration on exam. There is diffuse bronchial thickening. Posterior atelectasis noted in the lower lobes and diffuse mosaic lung attenuation consistent with small airways disease. No confluent pneumonia is seen or appreciable nodules. There is no pleural effusion, thickening or pneumothorax. Musculoskeletal: There is osteopenia with degenerative changes of the thoracic spine. Mild thoracic dextroscoliosis. Healed fracture deformity proximal right humerus and bilateral mild shoulder DJD. The ribcage is intact. No chest wall mass is seen. Review of the MIP images confirms the above findings. CTA ABDOMEN AND PELVIS FINDINGS VASCULAR Aorta: There are moderate mixed plaques without dissection, stenosis or aneurysm. Celiac: There is a 50% calcific stenosis in the proximal 6 mm of the vessel with mild fusiform poststenotic ectasia, patchy calcification in the splenic artery without visible branch vessel stenosis. There is a normal variant left gastric artery origin from the abdominal aorta just above the celiac origin which has a 75% soft plaque origin stenosis and otherwise opacifies well. SMA: There is moderate mixed plaque up to and just distal to the inflection point of the vessel, and 3 cm distal to the vessel origin there is soft plaque causing a 60% focal stenosis. The SMA otherwise without flow-limiting stenosis, and no aneurysm or dissection. Renals: There are mild calcific plaques in the  proximal renal arteries but no flow-limiting stenosis, aneurysm or dissection. On the left there is early takeoff of a hypoplastic upper pole artery without stenosis. IMA: 80% or greater calcific origin stenosis. Otherwise the vessel opacifies well. Inflow: There are moderate patchy calcific/mixed plaques in the common iliac and internal iliac arteries, without flow-limiting narrowing. Both external iliac arteries show only minimal calcific plaque and no stenosis, aneurysm or dissection. The proximal outflow vessels are widely patent with mild calcifications in the common femoral arteries. Veins: Unopacified and not evaluated. The main portal vein and IVC are within normal caliber limits. Review of the MIP images confirms the above findings. NON-VASCULAR Hepatobiliary: 19 cm in length liver with mild general fatty replacement. There are scattered cysts, largest in the left lobe is 2.5 cm, largest on the right is in the anterior segment measuring 2 cm. Gallbladder is absent without biliary prominence. Pancreas: Partially atrophic, otherwise unremarkable. Spleen: No mass enhancement or splenomegaly. Adrenals/Urinary Tract: 4 mm and 3 mm nonobstructing caliceal stones are noted in the upper and lower right renal collecting system. Bilateral small parapelvic cysts and bilateral cortical renal cysts are again shown, largest are in the right kidney measuring 5.4 cm laterally in the midpole and 3.2 cm in the lower pole. Cysts on the left are smaller, largest is a 2 cm parapelvic cyst. No mass enhancement is seen, no ureteral stones or hydronephrosis and no adrenal mass. The bladder is unremarkable for the degree of distention. Stomach/Bowel: No dilatation or wall thickening. An appendix is not seen in this patient. There is diffuse diverticulosis. No evidence of colitis or diverticulitis. Lymphatic: No adenopathy is seen. Reproductive: Prostatomegaly with transverse diameter 5.2 cm. Other: There is no free air, free  hemorrhage, free fluid or acute inflammatory changes. Small supraumbilical, umbilical and inguinal fat hernias are noted without incarcerated hernia. Musculoskeletal: There is osteopenia with degenerative changes in the lumbar spine. Severe acquired spinal stenosis L4-5, moderate acquired spinal stenosis L2-3 and L3-4. No acute or other significant osseous findings. Review of the MIP images confirms the above findings. IMPRESSION: 1. Aortic and coronary artery atherosclerosis without  aneurysm, dissection or stenosis. 2. Cardiomegaly without evidence of CHF. 3. Upper-normal pulmonary arteries without evidence of central embolus. 4. Bronchitis with mosaic lung attenuation consistent with small airways disease. 5. Small hiatal hernia. 6. Fatty liver with scattered cysts. 7. Nonobstructive nephrolithiasis. 8. Bilateral renal cysts. 9. Diverticulosis without evidence of diverticulitis. 10. Prostatomegaly. 11. Umbilical, supraumbilical and inguinal fat hernias. 12. Osteopenia and degenerative change with multilevel acquired spinal stenosis most severe at L4-5. Electronically Signed   By: Telford Nab M.D.   On: 07/26/2022 23:46   DG Chest Port 1 View  Result Date: 07/26/2022 CLINICAL DATA:  Chest pain EXAM: PORTABLE CHEST 1 VIEW COMPARISON:  Chest x-ray 05/23/2022 FINDINGS: Heart is enlarged, unchanged. There is no focal lung infiltrate, pleural effusion or pneumothorax. No acute fractures are seen. IMPRESSION: No active disease. Electronically Signed   By: Ronney Asters M.D.   On: 07/26/2022 21:19   CT Renal Stone Study  Result Date: 07/24/2022 CLINICAL DATA:  Flank pain.  Kidney stone suspected EXAM: CT ABDOMEN AND PELVIS WITHOUT CONTRAST TECHNIQUE: Multidetector CT imaging of the abdomen and pelvis was performed following the standard protocol without IV contrast. RADIATION DOSE REDUCTION: This exam was performed according to the departmental dose-optimization program which includes automated exposure  control, adjustment of the mA and/or kV according to patient size and/or use of iterative reconstruction technique. COMPARISON:  None Available. FINDINGS: Lower chest: Lung bases are clear. Hepatobiliary: Several simple fluid attenuation cystic lesions in the liver. Postcholecystectomy. No biliary duct dilatation. Common bile duct is normal. Pancreas: Pancreas is normal. No ductal dilatation. No pancreatic inflammation. Spleen: Normal spleen Adrenals/urinary tract: Adrenal glands normal. Nonobstructing calculus in the upper pole of the RIGHT kidney measures 5 mm. 3 mm calculus in lower pole of the RIGHT kidney. No ureterolithiasis or obstructive uropathy. Multiple bilateral renal cystic lesions which have Hounsfield units less than 20 consistent with simple fluid. No bladder calculi Stomach/Bowel: The stomach, duodenum, and small bowel normal. Diverticulosis of the descending aorta acute diverticulitis. Vascular/Lymphatic: Abdominal aorta is normal caliber with atherosclerotic calcification. There is no retroperitoneal or periportal lymphadenopathy. No pelvic lymphadenopathy. Reproductive: Prostate unremarkable Other: No free fluid. Musculoskeletal: Hemangioma within the L5 vertebral body IMPRESSION: 1. Two nonobstructing RIGHT renal calculi. No ureterolithiasis or obstructive uropathy. 2. No bladder calculi. 3. Multiple lesions right Bosniak II benign renal cyst. No follow-up imaging is recommended. JACR 2018 Feb; 264-273, Management of the Incidental Renal Mass on CT, [employer] 2021; 814-848, Bosniak Classification of Cystic Renal Masses, Version 2019. 4. Benign-appearing hepatic cysts. Electronically Signed   By: Suzy Bouchard M.D.   On: 07/24/2022 10:02     Assessment & Plan:   Chest pain - Patient was admitted on 07/26/22 for chest pain, dx with bronchitis and likely GERD with esophageal dysmotility. He had mild non-obstructive CAD on coronary CT. He was placed on NSAIDs and Colchicine for possible  pericarditis, cardiology did not feel pain was consistent with cardiac origin and more consistent with GI causes or pulmonary d.t bronchitis findings. He was started on baby ASA, low dose beta blocker, atorvastatin and max GDMT d/t nonischemic cardiomyopathy. He is no longer havings chest pain symptoms. Has some dizziness after taking Jardiance. HP 50 today, advised he hold Toprol-xl dose and contact cardiology.   Hypertension - Stable; BP 122/58 - Continue Entresto 24-16m one tablet twice daily   VTE (venous thromboembolism) - Continue Eliquis 2.564mtwice daily   Chronic cough - Patient has chronic cough. He has no other associated respiratory  symptoms. Denies shortness of breath. CTA on 07/26/22 showed possible early ILD findings vs aspiration with small hiatal hernia. Needs outpatient HRCT in 3-4 weeks to monitor. Continue mucinex 661m twice daily and flutter valve. Encourage patient follow GERD diet and aspiration precautions. Needs visit with new [facility] pulmonary provider in 1 month.   GERD (gastroesophageal reflux disease) - Continue protonix 434mdaily - Follow-up GI outpatient    ElMartyn EhrichNP 08/05/2022

## 2022-08-05 NOTE — Assessment & Plan Note (Signed)
-   Continue Eliquis 2.'5mg'$  twice daily

## 2022-08-05 NOTE — Assessment & Plan Note (Signed)
-   Patient has chronic cough. He has no other associated respiratory symptoms. Denies shortness of breath. CTA on 07/26/22 showed possible early ILD findings vs aspiration with small hiatal hernia. Needs outpatient HRCT in 3-4 weeks to monitor. Continue mucinex '600mg'$  twice daily and flutter valve. Encourage patient follow GERD diet and aspiration precautions. Needs visit with new [facility] pulmonary provider in 1 month.

## 2022-08-05 NOTE — Assessment & Plan Note (Signed)
-   Continue protonix '40mg'$  daily - Follow-up GI outpatient

## 2022-08-05 NOTE — Progress Notes (Signed)
Reviewed and agree with assessment/plan.   Chesley Mires, MD [facility] Pulmonary/Critical Care 08/05/2022, 11:58 AM Pager:  [phone]

## 2022-08-05 NOTE — Assessment & Plan Note (Addendum)
-   Patient was admitted on 07/26/22 for chest pain, dx with bronchitis and likely GERD with esophageal dysmotility. He had mild non-obstructive CAD on coronary CT. He was placed on NSAIDs and Colchicine for possible pericarditis, cardiology did not feel pain was consistent with cardiac origin and more consistent with GI causes or pulmonary d.t bronchitis findings. He was started on baby ASA, low dose beta blocker, atorvastatin and max GDMT d/t nonischemic cardiomyopathy. He is no longer havings chest pain symptoms. Has some dizziness after taking Jardiance. HP 50 today, advised he hold Toprol-xl dose and contact cardiology.

## 2022-08-05 NOTE — Patient Instructions (Addendum)
Recommendations: Do not take Toprol-XL if HR <60  Ok to continue [employer] Continue reflux medication as directed  Check with cardiologist/PCP about Jardiance  Continue mucinex '600mg'$  twice daily Use flutter valve three times a day  Follow aspiration precautions   Orders: HRCT in 3-4 weeks re: ILD   Follow-up: 4-6 weeks with Dr. Silas Flood after CT chest (30 min slot- new patient)    Food Choices for Gastroesophageal Reflux Disease, Adult When you have gastroesophageal reflux disease (GERD), the foods you eat and your eating habits are very important. Choosing the right foods can help ease your discomfort. Think about working with a food expert (dietitian) to help you make good choices. What are tips for following this plan? Reading food labels Look for foods that are low in saturated fat. Foods that may help with your symptoms include: Foods that have less than 5% of daily value (DV) of fat. Foods that have 0 grams of trans fat. Cooking Do not fry your food. Cook your food by baking, steaming, grilling, or broiling. These are all methods that do not need a lot of fat for cooking. To add flavor, try to use herbs that are low in spice and acidity. Meal planning  Choose healthy foods that are low in fat, such as: Fruits and vegetables. Whole grains. Low-fat dairy products. Lean meats, fish, and poultry. Eat small meals often instead of eating 3 large meals each day. Eat your meals slowly in a place where you are relaxed. Avoid bending over or lying down until 2-3 hours after eating. Limit high-fat foods such as fatty meats or fried foods. Limit your intake of fatty foods, such as oils, butter, and shortening. Avoid the following as told by your doctor: Foods that cause symptoms. These may be different for different people. Keep a food diary to keep track of foods that cause symptoms. Alcohol. Drinking a lot of liquid with meals. Eating meals during the 2-3  hours before bed. Lifestyle Stay at a healthy weight. Ask your doctor what weight is healthy for you. If you need to lose weight, work with your doctor to do so safely. Exercise for at least 30 minutes on 5 or more days each week, or as told by your doctor. Wear loose-fitting clothes. Do not smoke or use any products that contain nicotine or tobacco. If you need help quitting, ask your doctor. Sleep with the head of your bed higher than your feet. Use a wedge under the mattress or blocks under the bed frame to raise the head of the bed. Chew sugar-free gum after meals. What foods should eat?  Eat a healthy, well-balanced diet of fruits, vegetables, whole grains, low-fat dairy products, lean meats, fish, and poultry. Each person is different. Foods that may cause symptoms in one person may not cause any symptoms in another person. Work with your doctor to find foods that are safe for you. The items listed above may not be a complete list of what you can eat and drink. Contact a food expert for more options. What foods should I avoid? Limiting some of these foods may help in managing the symptoms of GERD. Everyone is different. Talk with a food expert or your doctor to help you find the exact foods to avoid, if any. Fruits Any fruits prepared with added fat. Any fruits that cause symptoms. For some people, this may include citrus fruits, such as oranges, grapefruit, pineapple, and lemons. Vegetables Deep-fried vegetables. [place] fries. Any vegetables prepared  with added fat. Any vegetables that cause symptoms. For some people, this may include tomatoes and tomato products, chili peppers, onions and garlic, and horseradish. Grains Pastries or quick breads with added fat. Meats and other proteins High-fat meats, such as fatty beef or pork, hot dogs, ribs, ham, sausage, salami, and bacon. Fried meat or protein, including fried fish and fried chicken. Nuts and nut butters, in large  amounts. Dairy Whole milk and chocolate milk. Sour cream. Cream. Ice cream. Cream cheese. Milkshakes. Fats and oils Butter. Margarine. Shortening. Ghee. Beverages Coffee and tea, with or without caffeine. Carbonated beverages. Sodas. Energy drinks. Fruit juice made with acidic fruits, such as orange or grapefruit. Tomato juice. Alcoholic drinks. Sweets and desserts Chocolate and cocoa. Donuts. Seasonings and condiments Pepper. Peppermint and spearmint. Added salt. Any condiments, herbs, or seasonings that cause symptoms. For some people, this may include curry, hot sauce, or vinegar-based salad dressings. The items listed above may not be a complete list of what you should not eat and drink. Contact a food expert for more options. Questions to ask your doctor Diet and lifestyle changes are often the first steps that are taken to manage symptoms of GERD. If diet and lifestyle changes do not help, talk with your doctor about taking medicines. Where to find more information International Foundation for Gastrointestinal Disorders: aboutgerd.org Summary When you have GERD, food and lifestyle choices are very important in easing your symptoms. Eat small meals often instead of 3 large meals a day. Eat your meals slowly and in a place where you are relaxed. Avoid bending over or lying down until 2-3 hours after eating. Limit high-fat foods such as fatty meats or fried foods. This information is not intended to replace advice given to you by your health care provider. Make sure you discuss any questions you have with your health care provider. Document Revised: 02/19/2020 Document Reviewed: 02/19/2020 Elsevier Patient Education  [place].  Aspiration Precautions, Adult Aspiration is when a person breathes in (inhales) a liquid or other material, and it goes into the lungs. Adults who have conditions that affect their brain or spinal cord or who have trouble swallowing, a decreased gag  reflex, or trouble moving around (mobility) are at risk for this condition. Things that can be inhaled into the lungs include: Food. Any type of liquid. This includes drinks and saliva. Stomach contents. This includes vomit and stomach acid. This condition can cause an infection in the lungs (pneumonia). Certain steps can be taken to reduce the risk of aspiration. What are the signs of aspiration? Signs may include: Coughing. A person may: Cough after they swallow food or liquids. Cough up mucus (sputum) that is yellow, tan, or green. It may also smell bad or have pieces of food in it. Cough when lying down or have to sit up quickly after lying down. Have a hoarse, barky cough. Trouble breathing. This may include: Breathing very quickly or very slowly. Loud breathing. High-pitched whistling sounds during breathing, most often when breathing out (wheezing). Trouble eating. This may involve: Clearing the throat often while eating. Drooling while eating. Having a feeling of fullness in the throat or like something is stuck in the throat. Having a runny nose and watery eyes while eating. Speaking problems. This may include a hoarse voice or inability to speak. Choking often while eating. Changes in skin color. The skin may look red or blue. Pain in the chest or back during or right after eating. What problems can aspiration  cause? This condition can cause problems such as: Losing weight because the person is not absorbing the nutrients they need. Loss of enjoyment and the social benefits of eating. Choking. Lung irritation. This can happen if someone aspirates acidic food or drinks. Pneumonia. Lung abscess. This is a collection of infected liquid (pus) in the lungs. In serious cases, death can occur. What can I do to prevent aspiration? Caring for someone who has a feeding tube If you are caring for someone who has a feeding tube and cannot eat or drink safely by mouth: Keep the  person in an upright position as much as possible. Do not lay the person flat if they get feedings around the clock (continuous feedings). If you need to lay the person flat for any reason, turn the feeding pump off. Check for left over liquid (residuals) in the stomach as told by the health care provider. Ask the health care provider what residual amount is too high. Caring for someone who can eat and drink safely by mouth If you are caring for someone who can eat and drink safely by mouth: Have the person sit in an upright position when they eat or drink. This can be done by: Having the person sit up in a chair. Positioning the person in bed so they are upright. This can be done if sitting in a chair is not possible. Remind the person to eat slowly and chew well. Make sure the person is awake and alert while eating. Never put food or liquids in the mouth of a person who is not fully alert. Do not distract the person, especially if they have problems with thinking or memory (cognitive problems). Allow foods to cool. Hot foods may be harder to swallow. Provide small meals often rather than three large meals. This may help the person to not feel tired when eating. After the person is done eating: Check their mouth well for leftover food. Keep them sitting upright for 30-45 minutes. Do not serve food or drink within 2 hours of bedtime. General instructions To prevent aspiration in someone who can eat and drink safely by mouth: Feed small bites of food. Do not force-feed. If the person is on a diet because of problems with swallowing (dysphagia diet), follow the food and drink consistency that the health care provider recommends. You may be told to thicken a liquid using a thickening product. Use as little water as possible when brushing the person's teeth or cleaning their mouth. Provide oral care before and after meals. Use adaptive devices as told by the health care provider. These may include  cut-out cups or other utensils. Crush pills and put them in soft food, such as pudding or ice cream. Some pills should not be crushed. Check with the health care provider before crushing any medicine. If the person is choking on food or an object, do abdominal thrusts.  Contact a health care provider if: The person has a feeding tube, and the feeding tube residual amount is too high. The person tries to avoid food or water. They may refuse to eat, drink, or be fed, or they may eat less than normal. The person may have aspirated food or liquid. The person shows warning signs of aspiration. These include choking or coughing when they eat or drink. The person has symptoms of pneumonia. These may include: Coughing a lot or coughing up mucus with a bad smell or blood in it. A long-lasting (chronic) cough. Shortness of breath or wheezing.  Chest or back pain. Sweating, fever, or chills. Get help right away if: The person has trouble breathing or starts to breathe fast. The person is breathing very slowly or stops breathing. The person cannot stop choking. The person turns blue, faints, or seems confused. These symptoms may be an emergency. Get help right away. Call 911. Do not wait to see if the symptoms will go away. This information is not intended to replace advice given to you by your health care provider. Make sure you discuss any questions you have with your health care provider. Document Revised: 01/21/2022 Document Reviewed: 01/21/2022 Elsevier Patient Education  [place].

## 2022-08-07 DIAGNOSIS — R29898 Other symptoms and signs involving the musculoskeletal system: Secondary | ICD-10-CM | POA: Diagnosis not present

## 2022-08-07 DIAGNOSIS — M6281 Muscle weakness (generalized): Secondary | ICD-10-CM | POA: Diagnosis not present

## 2022-08-11 DIAGNOSIS — R29898 Other symptoms and signs involving the musculoskeletal system: Secondary | ICD-10-CM | POA: Diagnosis not present

## 2022-08-11 DIAGNOSIS — M6281 Muscle weakness (generalized): Secondary | ICD-10-CM | POA: Diagnosis not present

## 2022-08-13 DIAGNOSIS — R29898 Other symptoms and signs involving the musculoskeletal system: Secondary | ICD-10-CM | POA: Diagnosis not present

## 2022-08-13 DIAGNOSIS — M6281 Muscle weakness (generalized): Secondary | ICD-10-CM | POA: Diagnosis not present

## 2022-08-19 DIAGNOSIS — M6281 Muscle weakness (generalized): Secondary | ICD-10-CM | POA: Diagnosis not present

## 2022-08-19 DIAGNOSIS — S42201D Unspecified fracture of upper end of right humerus, subsequent encounter for fracture with routine healing: Secondary | ICD-10-CM | POA: Diagnosis not present

## 2022-08-19 DIAGNOSIS — R29898 Other symptoms and signs involving the musculoskeletal system: Secondary | ICD-10-CM | POA: Diagnosis not present

## 2022-08-21 DIAGNOSIS — M6281 Muscle weakness (generalized): Secondary | ICD-10-CM | POA: Diagnosis not present

## 2022-08-21 DIAGNOSIS — R29898 Other symptoms and signs involving the musculoskeletal system: Secondary | ICD-10-CM | POA: Diagnosis not present

## 2022-08-25 ENCOUNTER — Ambulatory Visit (HOSPITAL_COMMUNITY)
Admission: RE | Admit: 2022-08-25 | Discharge: 2022-08-25 | Disposition: A | Discharge status: Home / Self Care | Payer: Medicare PPO | Source: Ambulatory Visit | Attending: Primary Care | Admitting: Primary Care

## 2022-08-25 DIAGNOSIS — J84112 Idiopathic pulmonary fibrosis: Secondary | ICD-10-CM | POA: Diagnosis not present

## 2022-08-25 DIAGNOSIS — R918 Other nonspecific abnormal finding of lung field: Secondary | ICD-10-CM | POA: Diagnosis not present

## 2022-08-25 DIAGNOSIS — R053 Chronic cough: Secondary | ICD-10-CM | POA: Insufficient documentation

## 2022-08-25 DIAGNOSIS — J479 Bronchiectasis, uncomplicated: Secondary | ICD-10-CM | POA: Diagnosis not present

## 2022-08-25 DIAGNOSIS — I700 Atherosclerosis of aorta: Secondary | ICD-10-CM | POA: Diagnosis not present

## 2022-08-25 NOTE — Progress Notes (Signed)
Cardiology Office Note:    Date:  08/28/2022   ID:  Eric Chung, DOB 1935/05/01, MRN [id number]  PCP:  Mast, Man X, NP   [facility] Providers Cardiologist:  Early Osmond, MD     Referring MD: Mast, Man X, NP   Chief Complaint: hospital follow-up HFrEF  History of Present Illness:    Eric Chung is a pleasant 88 y.o. 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   Referred to cardiology by PCP and seen by Dr. Ali Lowe 07/07/2021 for evaluation of skipped heartbeats with recent EKG that demonstrated sinus rhythm with right bundle branch block, left anterior fascicular block, and PVCs.  At that time he reported an extensive cardiac evaluation years previous prior to hernia surgery.  Results of that exam reportedly benign although results not available for review. TTE 07/30/21 revealed LVEF 30 to 35%, difficult to assess wall motion due to very frequent PVCs with septal lateral dyssynchrony.  Overall appears to have global HK, mildly dilated LV cavity, mild LVH, grade 1 diastolic dysfunction, normal RV, mildly dilated LA, mild MR, mild calcification of the aortic valve, mild AI, no evidence of aortic stenosis.  Mild dilatation of the aortic root measuring 40 mm and mild dilatation of the ascending aorta measuring 37 mm. No prior echo for comparison.He was advised to d/c Verapamil and start Toprol XL 25 mg daily.   Return office visit 10/06/21 at which time per record review, he did not wish to undergo invasive assessment and therefore no plan for cardiac catheterization. Toprol XL was reduced to 12.5 mg daily and he was advised to start losartan 25 mg and Jardiance 10 mg daily. TTE 01/05/22 revealed LVEF 35 to 40%, moderately reduced LV function, global HK, indeterminate diastolic filling pressure, no significant change from previous study.   Last cardiology  clinic visit was 05/04/2022 with Dr. Ali Lowe at which time he was in stable condition. Plan to repeat in 3 months and continue current medical therapy. Would consider addition of spironolactone if EF remained reduced.  Admission 12/3-12/5/23 for left-sided shoulder pain that occurred while watching TV and moved across his chest and back as well as diaphoresis, nausea, and lightheadedness.  He was given SL NTG with improvement of pain but he continued to have sharp pain with deep inspiration to the point it was difficult for him to take another deep breath.  Was given morphine with pain relief but pain would return.  In ED serum creatinine 1.25, high-sensitivity troponin 30 and 29, other labs unremarkable.  Chest CT showed no evidence of CHF but did show findings consistent with acute bronchitis and small airway disease.  He was noted to have trace three-vessel coronary calcifications.  EKG was nonischemic with NSR with chronic right bundle branch block and left anterior fascicular block. Cardiology was asked to consult. Hs troponin 30 ? 29 ? 67 ? 62.  No pericardial effusion on CT but clinically symptoms consistent with pericarditis. ESR was normal.   Coronary CTA 07/28/2022 with calcium score of 41, 25-49% pLAD/OM1 and dRCA stenosis and aortic atherosclerosis.  Noncardiac portion showed possible early ILD versus aspiration with small hiatal hernia and possible esophageal dysmotility.  Suspicion that CP related to GI etiology with GERD, esophageal dysmotility and possible pleuritic CP from possible aspiration. ESR normal and no evidence of pericardial effusion on echo or CTA so colchicine was stopped.  Losartan was  changed to [facility] 24-26 twice daily.  No additional medication changes were made due to soft BP.  Today, he is here for post-hospital follow-up. Reports he is feeling well today. Did not feel well after hospitalization. He has stopped many of his cardiac medications due to diarrhea and feeling like  he may pass out.  Discussed these concerns with PCP and together they decided he should stop some of his medications to see if symptoms improve. She encouraged him to take Jardiance which he did this morning and thus far, no ill effects. SBP 120-128 at home. Is concerned about his "gut health" 2/2 his father died of esophageal cancer and grandmother died of colon cancer . He has had relief in chest pain that sent him to hospital 12/3. Reports he was given a medication soon after arrival that relieved those symptoms. He denies palpitations, dyspnea, orthopnea, PND, syncope. Has difficulty with coughing at night. Feels that it is 2/2 to phlegm production and reflux. No bleeding concerns.   Past Medical History:  Diagnosis Date   Apnea, sleep    Barrett's esophagus    Gout    Hypertension    Obesity    Pulmonary emboli [facility])     Past Surgical History:  Procedure Laterality Date   CHOLECYSTECTOMY  08/29/2017   EYE SURGERY Bilateral 2011-2012   catracts removed   HERNIA REPAIR  2005   T. Carlton Adam MD   TONSILECTOMY/ADENOIDECTOMY WITH MYRINGOTOMY  1944    Current Medications: Current Meds  Medication Sig   allopurinol (ZYLOPRIM) 100 MG tablet TAKE ONE TABLET BY MOUTH TWICE DAILY   apixaban (ELIQUIS) 2.5 MG TABS tablet Take 2.5 mg by mouth 2 (two) times a day.    aspirin EC 81 MG tablet Take 1 tablet (81 mg total) by mouth daily. Swallow whole.   empagliflozin (JARDIANCE) 10 MG TABS tablet Take by mouth daily.   fluticasone (FLONASE) 50 MCG/ACT nasal spray Place 1 spray into both nostrils daily.    HYDROcodone-acetaminophen (NORCO/VICODIN) 5-325 MG tablet Take 1-2 tablets by mouth every 6 (six) hours as needed. (Patient taking differently: Take 1-2 tablets by mouth every 6 (six) hours as needed for moderate pain.)   latanoprost (XALATAN) 0.005 % ophthalmic solution Place 1 drop into both eyes at bedtime.   Multiple Vitamins-Minerals (MULTIVITAMIN WITH MINERALS) tablet Take 1 tablet by mouth  daily.   nitroGLYCERIN (NITROSTAT) 0.4 MG SL tablet Place 1 tablet (0.4 mg total) under the tongue every 5 (five) minutes as needed for chest pain.   omeprazole (PRILOSEC) 20 MG capsule Take 20 mg by mouth daily.   Probiotic Product (PROBIOTIC DAILY PO) Take 1 capsule by mouth daily.     Allergies:   Penicillins   Social History   Socioeconomic History   Marital status: Married    Spouse name: Inez Catalina   Number of children: 1   Years of education: Not on file   Highest education level: Not on file  Occupational History   Not on file  Tobacco Use   Smoking status: Former    Packs/day: 1.00    Years: 15.00    Total pack years: 15.00    Types: Cigarettes    Quit date: 08/24/1984    Years since quitting: 38.0   Smokeless tobacco: Never  Vaping Use   Vaping Use: Never used  Substance and Sexual Activity   Alcohol use: Yes    Alcohol/week: 3.0 - 4.0 standard drinks of alcohol    Types: 3 - 4 Standard  drinks or equivalent per week    Comment: 2-3 beers a month   Drug use: Not Currently   Sexual activity: Not Currently  Other Topics Concern   Not on file  Social History Narrative   Social History     Marital status: Married ,1986            Spouse name: Inez Catalina                    Years of education:                 Number of children: 1                Occupational History: Pharmacist, hospital     None on file      Social History Main Topics     Smoking status:                       Smokeless tobacco: Not on file                        Alcohol use: 3-4 drinks per week     Drug use: Unknown     Sexual activity: Not on file            Other Topics            Concern     None on file      Social History Narrative     None on file            Social Determinants of Health   Financial Resource Strain: Low Risk  (07/11/2018)   Overall Financial Resource Strain (CARDIA)    Difficulty of Paying Living Expenses: Not hard at all  Food Insecurity: No Food Insecurity (07/27/2022)   Hunger  Vital Sign    Worried About Running Out of Food in the Last Year: Never true    Ran Out of Food in the Last Year: Never true  Transportation Needs: No Transportation Needs (07/27/2022)   PRAPARE - Hydrologist (Medical): No    Lack of Transportation (Non-Medical): No  Physical Activity: Inactive (07/11/2018)   Exercise Vital Sign    Days of Exercise per Week: 0 days    Minutes of Exercise per Session: 0 min  Stress: No Stress Concern Present (07/11/2018)   [place]    Feeling of Stress : Only a little  Social Connections: Socially Integrated (07/11/2018)   Social Connection and Isolation Panel [NHANES]    Frequency of Communication with Friends and Family: More than three times a week    Frequency of Social Gatherings with Friends and Family: More than three times a week    Attends Religious Services: More than 4 times per year    Active Member of [employer] or Organizations: Yes    Attends Music therapist: More than 4 times per year    Marital Status: Married     Family History: The patient's family history includes Birth defects in his maternal grandmother; Cancer (age of onset: 58) in his father; Crohn's disease in his sister; Diabetes in his paternal grandmother; Osteoporosis in his mother.  ROS:   Please see the history of present illness.   All other systems reviewed and are negative.  Labs/Other Studies Reviewed:    The following studies were reviewed today:  CCTA 07/28/22 1.  Coronary calcium score of 41.1. This was 8 percentile for age-, sex, and race-matched controls.   2. Normal coronary origin with right dominance.   3. Mild (25-49) stenoses in the proximal LAD, OM1 and distal RCA.   4. Aortic atherosclerosis.   Echo 07/27/22 1. Exremelly poor acoustic windows llimit study, even with Definity use.  No significant change in reported LVEF from previous echo.  Left  ventricular ejection fraction, by estimation, is 35 to 40%. The left  ventricle has moderately decreased function.  The left ventricular internal cavity size was mildly dilated.   2. Right ventricular systolic function is normal. The right ventricular  size is normal.   3. The mitral valve is normal in structure. No evidence of mitral valve  regurgitation.   4. The aortic valve is normal in structure. Aortic valve regurgitation is  mild.  Echo 07/30/21 1. Very frequent PVCs during the study.   2. Left ventricular ejection fraction, by estimation, is 30 to 35%. The  left ventricle has moderately decreased function. Difficult to assess wall  motion due to very frequent PVCs with septal-lateral dyssynchrony.  Overall, appears to have global  hypokinesis. The left ventricular internal cavity size was mildly dilated.  There is mild concentric left ventricular hypertrophy. Left ventricular  diastolic parameters are consistent with Grade I diastolic dysfunction  (impaired relaxation).   3. Right ventricular systolic function is normal. The right ventricular  size is normal.   4. Left atrial size was mildly dilated.   5. The mitral valve is grossly normal. Mild mitral valve regurgitation.   6. The aortic valve is tricuspid. There is mild calcification of the  aortic valve. There is mild thickening of the aortic valve. Aortic valve  regurgitation is mild. Aortic valve sclerosis/calcification is present,  without any evidence of aortic  stenosis.   7. Aortic dilatation noted. There is mild dilatation of the aortic root,  measuring 40 mm. There is mild dilatation of the ascending aorta,  measuring 37 mm.   Comparison(s): No prior Echocardiogram.  Recent Labs: 07/28/2022: ALT 10; BUN 23; Creatinine, Ser 1.36; Hemoglobin 13.5; Magnesium 2.0; Platelets 214; Potassium 4.2; Sodium 133; TSH 1.100  Recent Lipid Panel    Component Value Date/Time   CHOL 223 (H) 10/05/2019 0700   TRIG 153  (H) 10/05/2019 0700   HDL 43 10/05/2019 0700   CHOLHDL 5.2 (H) 10/05/2019 0700   LDLCALC 152 (H) 10/05/2019 0700     Risk Assessment/Calculations:       Physical Exam:    VS:  BP 120/70   Pulse 95   Ht _0  (1.803 m)   Wt 227 lb 12.8 oz (103.3 kg)   SpO2 98%   BMI 31.77 kg/m     Wt Readings from Last 3 Encounters:  08/28/22 227 lb 12.8 oz (103.3 kg)  08/26/22 227 lb (103 kg)  08/05/22 225 lb 3.2 oz (102.2 kg)     GEN: Obese, well developed in no acute distress HEENT: Normal NECK: No JVD; No carotid bruits CARDIAC: RRR, no murmurs, rubs, gallops RESPIRATORY:  Clear to auscultation without rales, wheezing or rhonchi  ABDOMEN: Soft, non-tender, non-distended MUSCULOSKELETAL:  No edema; No deformity. 2+ pedal pulses, equal bilaterally SKIN: Warm and dry NEUROLOGIC:  Alert and oriented x 3 PSYCHIATRIC:  Normal affect   EKG:  EKG is not ordered today.     Diagnoses:    1. Dilated cardiomyopathy ([place])   2. Primary hypertension   3. Aortic atherosclerosis ([place])  4. Mixed hyperlipidemia   5. Coronary artery disease involving native coronary artery of native heart without angina pectoris    Assessment and Plan:     NICM/Dilated CM: LVEF 35 to 40%, mildly dilated LV, normal RV, mild AI on echo 07/27/2022.  GDMT initiated during hospitalization, however he has had some intolerances to metoprolol and Entresto. Currently, on Jardiance. Will continue to monitor for adverse side effects. He denies dyspnea, edema, weight gain, orthopnea, PND.  Volume status is difficult to assess due to body habitus. No obvious signs of volume overload.  Advised him to continue to monitor weight, follow a low-sodium heart healthy diet, and call back to report any concerning symptoms prior to next appointment. Will see him back in 3 months for titration of GDMT. Continue Jardiance 10 mg daily.   CAD without angina: Mild non-obstructive CAD on CCTA 07/28/22. He denies chest pain, dyspnea, or other  symptoms concerning for angina.  No indication for further ischemic evaluation at this time.  Emphasized the importance of secondary prevention including weight loss, heart healthy, mostly plant-based diet, regular physical activity, and good cholesterol and blood pressure control.   Hypertension: BP is stable today. He reports similar readings at home. Had presyncope on discharge medications including Entresto, Jardiance, and metoprolol. Has stopped Entresto and metoprolol. Resumed Jardiance today. Will continue to monitor for adverse reaction. Encouraged him to resume low dose metoprolol if HR > 100 bpm on a consistent basis.   Hyperlipidemia LDL goal < 70/aortic atherosclerosis: Started on atorvastatin 80 mg daily during hospitalization. He has stopped atorvastatin as he felt it was not necessary since he does not have obstructive coronary artery disease. We will recheck lipid panel today.  Encouraged him to consider low-dose statin in the setting of mild nonobstructive CAD and aortic atherosclerosis. If LDL > 70, would recommend rosuvastatin 5 mg daily.  Chronic DVT/PE: No bleeding concerns. Continue Eliquis 2.5 mg twice daily.    Disposition: 3 months with Dr. Ali Lowe or me  Medication Adjustments/Labs and Tests Ordered: Current medicines are reviewed at length with the patient today.  Concerns regarding medicines are outlined above.  Orders Placed This Encounter  Procedures   Lipid Profile   Comp Met (CMET)   No orders of the defined types were placed in this encounter.   Patient Instructions  Medication Instructions:  Your physician recommends that you continue on your current medications as directed. Please refer to the Current Medication list given to you today.  **Please consider restarting a low dose statin medication for your mild coronary artery disease - rosuvastatin (Crestor) 5 mg daily If heart rate (pulse) remains > 100 beats per minute, would recommend that you restart  metoprolol succinate (Toprol XL) 12.5 mg daily  *If you need a refill on your cardiac medications before your next appointment, please call your pharmacy*   Lab Work: TODAY - Lipid panel and complete metabolic panel If you have labs (blood work) drawn today and your tests are completely normal, you will receive your results only by: [place] (if you have MyChart) OR A paper copy in the mail If you have any lab test that is abnormal or we need to change your treatment, we will call you to review the results.   Testing/Procedures: None Ordered   Follow-Up: At [facility], you and your health needs are our priority.  As part of our continuing mission to provide you with exceptional heart care, we have created designated Provider Care Teams.  These  Care Teams include your primary Cardiologist (physician) and Advanced Practice Providers (APPs -  Physician Assistants and Nurse Practitioners) who all work together to provide you with the care you need, when you need it.  We recommend signing up for the patient portal called "MyChart".  Sign up information is provided on this After Visit Summary.  MyChart is used to connect with patients for Virtual Visits (Telemedicine).  Patients are able to view lab/test results, encounter notes, upcoming appointments, etc.  Non-urgent messages can be sent to your provider as well.   To learn more about what you can do with MyChart, go to [email or web address].    Your next appointment:   3 month(s)  The format for your next appointment:   In Person  Provider:   Christen Bame, NP    Other Instructions Adopting a Healthy Lifestyle.   Weight: Know what a healthy weight is for you (roughly BMI <25) and aim to maintain this. You can calculate your body mass index on your smart phone  Diet: Aim for 7+ servings of fruits and vegetables daily Limit animal fats in diet for cholesterol and heart health - choose grass fed whenever  available Avoid highly processed foods (fast food burgers, tacos, fried chicken, pizza, hot dogs, french fries)  Saturated fat comes in the form of butter, lard, coconut oil, margarine, partially hydrogenated oils, and fat in meat. These increase your risk of cardiovascular disease.  Use healthy plant oils, such as olive, canola, soy, corn, sunflower and peanut.  Whole foods such as fruits, vegetables and whole grains have fiber  Men need > 38 grams of fiber per day Women need > 25 grams of fiber per day  Load up on vegetables and fruits - one-half of your plate: Aim for color and variety, and remember that potatoes dont count. Go for whole grains - one-quarter of your plate: Whole wheat, barley, wheat berries, quinoa, oats, brown rice, and foods made with them. If you want pasta, go with whole wheat pasta. Protein power - one-quarter of your plate: Fish, chicken, beans, and nuts are all healthy, versatile protein sources. Limit red meat. You need carbohydrates for energy! The type of carbohydrate is more important than the amount. Choose carbohydrates such as vegetables, fruits, whole grains, beans, and nuts in the place of white rice, white pasta, potatoes (baked or fried), macaroni and cheese, cakes, cookies, and donuts.  If youre thirsty, drink water. Coffee and tea are good in moderation, but skip sugary drinks and limit milk and dairy products to one or two daily servings. Keep sugar intake at 6 teaspoons or 24 grams or LESS       Exercise: Aim for 150 min of moderate intensity exercise weekly for heart health, and weights twice weekly for bone health Stay active - any steps are better than no steps! Aim for 7-9 hours of sleep daily        Important Information About Sugar         Signed, Emmaline Life, NP  08/28/2022 12:59 PM    [place]

## 2022-08-26 ENCOUNTER — Encounter: Payer: Self-pay | Admitting: Nurse Practitioner

## 2022-08-26 ENCOUNTER — Ambulatory Visit (HOSPITAL_COMMUNITY): Payer: Medicare PPO

## 2022-08-26 DIAGNOSIS — M6281 Muscle weakness (generalized): Secondary | ICD-10-CM | POA: Diagnosis not present

## 2022-08-26 DIAGNOSIS — R29898 Other symptoms and signs involving the musculoskeletal system: Secondary | ICD-10-CM | POA: Diagnosis not present

## 2022-08-27 ENCOUNTER — Encounter: Payer: Self-pay | Admitting: Nurse Practitioner

## 2022-08-27 ENCOUNTER — Non-Acute Institutional Stay: Payer: Medicare PPO | Admitting: Nurse Practitioner

## 2022-08-27 VITALS — BP 136/94 | HR 93 | Temp 98.3°F | Ht 71.0 in | Wt 227.0 lb

## 2022-08-27 DIAGNOSIS — I429 Cardiomyopathy, unspecified: Secondary | ICD-10-CM | POA: Diagnosis not present

## 2022-08-27 DIAGNOSIS — I10 Essential (primary) hypertension: Secondary | ICD-10-CM

## 2022-08-27 DIAGNOSIS — K219 Gastro-esophageal reflux disease without esophagitis: Secondary | ICD-10-CM | POA: Diagnosis not present

## 2022-08-27 DIAGNOSIS — J309 Allergic rhinitis, unspecified: Secondary | ICD-10-CM

## 2022-08-27 DIAGNOSIS — J849 Interstitial pulmonary disease, unspecified: Secondary | ICD-10-CM

## 2022-08-27 DIAGNOSIS — Z66 Do not resuscitate: Secondary | ICD-10-CM | POA: Diagnosis not present

## 2022-08-27 DIAGNOSIS — E871 Hypo-osmolality and hyponatremia: Secondary | ICD-10-CM

## 2022-08-27 DIAGNOSIS — M10071 Idiopathic gout, right ankle and foot: Secondary | ICD-10-CM | POA: Diagnosis not present

## 2022-08-27 DIAGNOSIS — G43109 Migraine with aura, not intractable, without status migrainosus: Secondary | ICD-10-CM

## 2022-08-27 DIAGNOSIS — M159 Polyosteoarthritis, unspecified: Secondary | ICD-10-CM | POA: Insufficient documentation

## 2022-08-27 DIAGNOSIS — I129 Hypertensive chronic kidney disease with stage 1 through stage 4 chronic kidney disease, or unspecified chronic kidney disease: Secondary | ICD-10-CM

## 2022-08-27 DIAGNOSIS — N182 Chronic kidney disease, stage 2 (mild): Secondary | ICD-10-CM

## 2022-08-27 DIAGNOSIS — G4730 Sleep apnea, unspecified: Secondary | ICD-10-CM

## 2022-08-27 DIAGNOSIS — E782 Mixed hyperlipidemia: Secondary | ICD-10-CM

## 2022-08-27 DIAGNOSIS — I8290 Acute embolism and thrombosis of unspecified vein: Secondary | ICD-10-CM | POA: Diagnosis not present

## 2022-08-27 DIAGNOSIS — M150 Primary generalized (osteo)arthritis: Secondary | ICD-10-CM

## 2022-08-27 DIAGNOSIS — R7303 Prediabetes: Secondary | ICD-10-CM

## 2022-08-27 NOTE — Assessment & Plan Note (Signed)
uses prn Imitrex.

## 2022-08-27 NOTE — Assessment & Plan Note (Signed)
takes Atorvastatin, ASA

## 2022-08-27 NOTE — Assessment & Plan Note (Signed)
takes [place]

## 2022-08-27 NOTE — Assessment & Plan Note (Signed)
takes Flonase qd

## 2022-08-27 NOTE — Assessment & Plan Note (Signed)
not using BiPAP

## 2022-08-27 NOTE — Assessment & Plan Note (Signed)
follows cardiology, on Jardiance, Metoprolol, Entresto, no cardiac cath

## 2022-08-27 NOTE — Assessment & Plan Note (Signed)
Na 133 07/28/22, observe.

## 2022-08-27 NOTE — Assessment & Plan Note (Signed)
Barrett"s,  takes Pantoprazole, declined GI f/u. Hgb 13.5 07/28/22

## 2022-08-27 NOTE — Progress Notes (Signed)
Location:   Clinic [facility]   Place of Service:  Clinic (12) Provider: Marlana Latus NP  Code Status: DNR Goals of Care:     08/27/2022    8:09 AM  Advanced Directives  Does Patient Have a Medical Advance Directive? Yes  Type of Advance Directive Living will  Does patient want to make changes to medical advance directive? No - Patient declined     Chief Complaint  Patient presents with   Medical Management of Chronic Issues    5 month follow-up. Discuss need for AWV, flu vaccine and additional covid boosters or post pone if patient refuse or is not a candidate. [facility] verified. Patient stopped lipitor, jardiance, metoprolol, entresto, and imitrex  about 4 weeks ago due to symptoms.     HPI: Patient is a 88 y.o. male seen today for medical management of chronic diseases.    Fx of proximal end of R humerus, healed, under Ortho care.   Idiopathic pulmonary fibrosis/ILD/small airway disease per CT chest 08/25/22, followed by Pulmonology. Uses Mucinex, Flutter valve.   Hyponatremia, Na 133 07/28/22  CKD Bun/creat 23/1.36 07/28/22   Cardiomyopathy, follows cardiology, off Jardiance, Entresto, Metoprolol, no cardiac cath Gout, takes Allopurinol '100mg'$  qd, R ankle. Uric acid 4.6 10/06/19             Hx of PE/DVT(L leg), takes Eliquis 2.'5mg'$  bid             Allergic rhinitis, takes Flonase qd             GERD, Barrett"s,  takes Omeprazole, declined GI f/u. Hgb 13.5 07/28/22             HTN, desires Jardiance             OSA, not using BiPAP             Migraine with Aura, uses prn Imitrex.              Hyperlipidemia, takes Atorvastatin, ASA             Prediabetes, off Jardiance  Past Medical History:  Diagnosis Date   Apnea, sleep    Barrett's esophagus    Gout    Hypertension    Obesity    Pulmonary emboli [facility])     Past Surgical History:  Procedure Laterality Date   CHOLECYSTECTOMY  08/29/2017   EYE SURGERY Bilateral 2011-2012   catracts removed   HERNIA REPAIR  2005   T. Carlton Adam MD    TONSILECTOMY/ADENOIDECTOMY WITH MYRINGOTOMY  [phone]    Allergies  Allergen Reactions   Penicillins     Has patient had a PCN reaction causing immediate rash, facial/tongue/throat swelling, SOB or lightheadedness with hypotension: yes, rash Has patient had a PCN reaction causing severe rash involving mucus membranes or skin necrosis: no Has patient had a PCN reaction that required hospitalization: no Has patient had a PCN reaction occurring within the last 10 years: No If all of the above answers are "NO", then may proceed with Cephalosporin use.     Allergies as of 08/27/2022       Reactions   Penicillins    Has patient had a PCN reaction causing immediate rash, facial/tongue/throat swelling, SOB or lightheadedness with hypotension: yes, rash Has patient had a PCN reaction causing severe rash involving mucus membranes or skin necrosis: no Has patient had a PCN reaction that required hospitalization: no Has patient had a PCN reaction occurring within the last 10 years: No If  all of the above answers are "NO", then may proceed with Cephalosporin use.        Medication List        Accurate as of August 27, 2022  4:49 PM. If you have any questions, ask your nurse or doctor.          STOP taking these medications    empagliflozin 10 MG Tabs tablet Commonly known as: Jardiance Stopped by: Lashelle Koy X Surah Pelley, NP   ondansetron 4 MG disintegrating tablet Commonly known as: ZOFRAN-ODT Stopped by: Toniya Rozar X Kayra Crowell, NP   pantoprazole 40 MG tablet Commonly known as: PROTONIX Stopped by: Horton Ellithorpe X Zurie Platas, NP       TAKE these medications    allopurinol 100 MG tablet Commonly known as: ZYLOPRIM TAKE ONE TABLET BY MOUTH TWICE DAILY   apixaban 2.5 MG Tabs tablet Commonly known as: ELIQUIS Take 2.5 mg by mouth 2 (two) times a day.   aspirin EC 81 MG tablet Take 1 tablet (81 mg total) by mouth daily. Swallow whole.   atorvastatin 80 MG tablet Commonly known as: LIPITOR Take 1 tablet (80  mg total) by mouth daily.   Entresto 24-26 MG Generic drug: sacubitril-valsartan Take 1 tablet by mouth 2 (two) times daily.   fluticasone 50 MCG/ACT nasal spray Commonly known as: FLONASE Place 1 spray into both nostrils daily.   HYDROcodone-acetaminophen 5-325 MG tablet Commonly known as: NORCO/VICODIN Take 1-2 tablets by mouth every 6 (six) hours as needed. What changed: reasons to take this   latanoprost 0.005 % ophthalmic solution Commonly known as: XALATAN Place 1 drop into both eyes at bedtime.   metoprolol succinate 25 MG 24 hr tablet Commonly known as: Toprol XL Take 0.5 tablets (12.5 mg total) by mouth daily.   multivitamin with minerals tablet Take 1 tablet by mouth daily.   nitroGLYCERIN 0.4 MG SL tablet Commonly known as: NITROSTAT Place 1 tablet (0.4 mg total) under the tongue every 5 (five) minutes as needed for chest pain.   omeprazole 20 MG capsule Commonly known as: PRILOSEC Take 20 mg by mouth daily.   polyvinyl alcohol 1.4 % ophthalmic solution Commonly known as: LIQUIFILM TEARS Place 1 drop into both eyes as needed for dry eyes.   PROBIOTIC DAILY PO Take 1 capsule by mouth daily.   SUMAtriptan 50 MG tablet Commonly known as: IMITREX TAKE 1 TABLET AS NEEDED FOR MIGRAINE.        Review of Systems:  Review of Systems  Constitutional:  Negative for activity change, appetite change and fever.  HENT:  Positive for hearing loss. Negative for congestion, rhinorrhea and voice change.   Eyes:  Negative for visual disturbance.  Respiratory:  Positive for cough. Negative for chest tightness, shortness of breath and wheezing.        Occasional cough, phlegm sometimes, mostly in am, sometime at night.   Cardiovascular:  Negative for chest pain, palpitations and leg swelling.  Gastrointestinal:  Negative for abdominal pain and constipation.  Genitourinary:  Negative for dysuria, frequency and urgency.  Musculoskeletal:  Positive for arthralgias and  gait problem.  Skin:  Negative for color change.       Rosacea nose.   Neurological:  Negative for facial asymmetry, speech difficulty, weakness and light-headedness.       Migraine headache this morning.   Psychiatric/Behavioral:  Negative for behavioral problems and sleep disturbance. The patient is not nervous/anxious.     Health Maintenance  Topic Date Due   Medicare Annual Wellness (AWV)  10/01/2021   COVID-19 Vaccine (7 - 2023-24 season) 06/04/2022   DTaP/Tdap/Td (2 - Tdap) 07/31/2026   Pneumonia Vaccine 65+ Years old  Completed   INFLUENZA VACCINE  Completed   Zoster Vaccines- Shingrix  Completed   HPV VACCINES  Aged Out    Physical Exam: Vitals:   08/26/22 1629  BP: (!) 136/94  Pulse: 93  Temp: 98.3 F (36.8 C)  TempSrc: Temporal  SpO2: 96%  Weight: 227 lb (103 kg)  Height: '5\' 11"'$  (1.803 m)   Body mass index is 31.66 kg/m. Physical Exam Vitals and nursing note reviewed.  Constitutional:      Appearance: Normal appearance.  HENT:     Head: Normocephalic and atraumatic.     Nose: No congestion or rhinorrhea.     Mouth/Throat:     Mouth: Mucous membranes are moist.  Eyes:     Extraocular Movements: Extraocular movements intact.     Conjunctiva/sclera: Conjunctivae normal.     Pupils: Pupils are equal, round, and reactive to light.  Cardiovascular:     Rate and Rhythm: Normal rate and regular rhythm.     Heart sounds: No murmur heard. Pulmonary:     Effort: Pulmonary effort is normal.     Breath sounds: No rales.  Abdominal:     General: Bowel sounds are normal.     Palpations: Abdomen is soft.     Tenderness: There is no abdominal tenderness. There is no right CVA tenderness, left CVA tenderness, guarding or rebound.  Musculoskeletal:     Cervical back: Normal range of motion and neck supple.     Right lower leg: No edema.     Left lower leg: No edema.     Comments: Trace edema BLE  Skin:    General: Skin is warm and dry.     Comments: Dark redness  in toes, Left great toe>R great toe, better after elevation. rosacea nose  Neurological:     General: No focal deficit present.     Mental Status: He is alert and oriented to person, place, and time. Mental status is at baseline.     Cranial Nerves: No cranial nerve deficit.     Sensory: No sensory deficit.     Motor: No weakness.     Coordination: Coordination normal.     Gait: Gait abnormal.  Psychiatric:        Mood and Affect: Mood normal.        Behavior: Behavior normal.        Thought Content: Thought content normal.        Judgment: Judgment normal.     Labs reviewed: Basic Metabolic Panel: Recent Labs    07/26/22 2122 07/27/22 0445 07/28/22 0255  NA 139 136 133*  K 4.2 4.5 4.2  CL 109 107 105  CO2 '23 23 22  '$ GLUCOSE 130* 146* 112*  BUN '16 17 23  '$ CREATININE 1.25* 1.12 1.36*  CALCIUM 8.8* 8.6* 8.3*  MG  --   --  2.0  PHOS  --   --  3.7  TSH  --   --  1.100   Liver Function Tests: Recent Labs    05/23/22 1304 07/28/22 0255  AST 20 13*  ALT 14 10  ALKPHOS 79 64  BILITOT 1.0 1.6*  PROT 7.4 6.4*  ALBUMIN 4.0 3.0*   No results for input(s): "LIPASE", "AMYLASE" in the last 8760 hours. No results for input(s): "AMMONIA" in the last 8760 hours. CBC: Recent Labs  07/24/22 0927 07/26/22 2122 07/27/22 0445 07/28/22 0255  WBC 6.8 11.0* 9.7 8.5  NEUTROABS 3.4  --   --  4.7  HGB 15.3 15.1 14.3 13.5  HCT 46.7 46.3 44.4 43.1  MCV 93.0 94.7 95.1 96.4  PLT 235 241 212 214   Lipid Panel: No results for input(s): "CHOL", "HDL", "LDLCALC", "TRIG", "CHOLHDL", "LDLDIRECT" in the last 8760 hours. Lab Results  Component Value Date   HGBA1C 5.9 (H) 10/05/2019    Procedures since last visit: CT CHEST HIGH RESOLUTION  Result Date: 08/26/2022 CLINICAL DATA:  Interstitial lung disease, chronic cough EXAM: CT CHEST WITHOUT CONTRAST TECHNIQUE: Multidetector CT imaging of the chest was performed following the standard protocol without intravenous contrast. High  resolution imaging of the lungs, as well as inspiratory and expiratory imaging, was performed. RADIATION DOSE REDUCTION: This exam was performed according to the departmental dose-optimization program which includes automated exposure control, adjustment of the mA and/or kV according to patient size and/or use of iterative reconstruction technique. COMPARISON:  07/26/2022 FINDINGS: Cardiovascular: Aortic atherosclerosis. Mild cardiomegaly. Left and right coronary artery calcifications. No pericardial effusion. Mediastinum/Nodes: No enlarged mediastinal, hilar, or axillary lymph nodes. Thyroid gland, trachea, and esophagus demonstrate no significant findings. Lungs/Pleura: Mild pulmonary fibrosis in a pattern with apical to basal gradient, featuring irregular peripheral interstitial opacity, septal thickening, traction bronchiectasis and small areas of subpleural bronchiolectasis at the lung bases without clear evidence of honeycombing. Mild, lobular air trapping on expiratory phase imaging. No pleural effusion or pneumothorax. Upper Abdomen: No acute abnormality. Musculoskeletal: No chest wall abnormality. No acute osseous findings. IMPRESSION: 1. Mild pulmonary fibrosis in a pattern with apical to basal gradient, featuring irregular peripheral interstitial opacity, septal thickening, traction bronchiectasis and small areas of subpleural bronchiolectasis at the lung bases without clear evidence of honeycombing. Findings are categorized as probable UIP per consensus guidelines: Diagnosis of Idiopathic Pulmonary Fibrosis: An Official ATS/ERS/JRS/ALAT Clinical Practice Guideline. [place], Iss 5, [phone], Apr 24 2017. 2. Mild, lobular air trapping on expiratory phase imaging, suggestive of small airways disease. 3. Coronary artery disease. Aortic Atherosclerosis (ICD10-I70.0). Electronically Signed   By: Delanna Ahmadi M.D.   On: 08/26/2022 19:05    Assessment/Plan  Hyponatremia Na 133  07/28/22, observe.   Stage 2 chronic kidney disease due to benign hypertension Bun/creat 23/1.36 07/28/22  Cardiomyopathy [facility]) follows cardiology, on Jardiance, Metoprolol, Entresto, no cardiac cath  Gout of ankle takes Allopurinol '100mg'$  qd, R ankle. Uric acid 4.6 10/06/19  VTE (venous thromboembolism)   Hx of PE/DVT(L leg), takes Eliquis 2.'5mg'$  bid  Allergic rhinitis  takes Flonase qd  GERD (gastroesophageal reflux disease) Barrett"s,  takes Pantoprazole, declined GI f/u. Hgb 13.5 07/28/22  Hypertension Blood pressure is mildly elevated, likes to resume Jardiance.   Sleep apnea in adult not using BiPAP  Migraine headache with aura  uses prn Imitrex.   Hyperlipidemia takes Atorvastatin, ASA  Prediabetes  takes Jardiance  Osteoarthritis, multiple sites Fx of proximal end of R humerus, healed, under Ortho care. Prn Norco for pain  ILD (interstitial lung disease) ([place]) Idiopathic pulmonary fibrosis/ILD/small airway disease per CT chest 08/25/22, followed by Pulmonology. Uses Mucinex, Flutter valve.    Labs/tests ordered:  none  Next appt:  AWV 2 weeks

## 2022-08-27 NOTE — Assessment & Plan Note (Signed)
Hx of PE/DVT(L leg), takes Eliquis 2.'5mg'$  bid

## 2022-08-27 NOTE — Assessment & Plan Note (Addendum)
Idiopathic pulmonary fibrosis/ILD/small airway disease per CT chest 08/25/22, followed by Pulmonology. Uses Mucinex, Flutter valve.

## 2022-08-27 NOTE — Assessment & Plan Note (Addendum)
Blood pressure is mildly elevated, likes to resume Jardiance.

## 2022-08-27 NOTE — Assessment & Plan Note (Signed)
Fx of proximal end of R humerus, healed, under Ortho care. Prn Norco for pain

## 2022-08-27 NOTE — Assessment & Plan Note (Signed)
Bun/creat 23/1.36 07/28/22

## 2022-08-27 NOTE — Assessment & Plan Note (Signed)
takes Allopurinol '100mg'$  qd, R ankle. Uric acid 4.6 10/06/19

## 2022-08-28 ENCOUNTER — Ambulatory Visit: Payer: Medicare PPO | Attending: Nurse Practitioner | Admitting: Nurse Practitioner

## 2022-08-28 ENCOUNTER — Encounter: Payer: Self-pay | Admitting: Nurse Practitioner

## 2022-08-28 VITALS — BP 120/70 | HR 95 | Ht 71.0 in | Wt 227.8 lb

## 2022-08-28 DIAGNOSIS — I2510 Atherosclerotic heart disease of native coronary artery without angina pectoris: Secondary | ICD-10-CM

## 2022-08-28 DIAGNOSIS — E782 Mixed hyperlipidemia: Secondary | ICD-10-CM

## 2022-08-28 DIAGNOSIS — I420 Dilated cardiomyopathy: Secondary | ICD-10-CM | POA: Diagnosis not present

## 2022-08-28 DIAGNOSIS — I700 Atherosclerosis of aorta: Secondary | ICD-10-CM | POA: Diagnosis not present

## 2022-08-28 DIAGNOSIS — M6281 Muscle weakness (generalized): Secondary | ICD-10-CM | POA: Diagnosis not present

## 2022-08-28 DIAGNOSIS — I10 Essential (primary) hypertension: Secondary | ICD-10-CM

## 2022-08-28 DIAGNOSIS — R29898 Other symptoms and signs involving the musculoskeletal system: Secondary | ICD-10-CM | POA: Diagnosis not present

## 2022-08-28 DIAGNOSIS — Z7901 Long term (current) use of anticoagulants: Secondary | ICD-10-CM

## 2022-08-28 DIAGNOSIS — I429 Cardiomyopathy, unspecified: Secondary | ICD-10-CM | POA: Diagnosis not present

## 2022-08-28 LAB — COMPREHENSIVE METABOLIC PANEL
ALT: 11 IU/L (ref 0–44)
AST: 15 IU/L (ref 0–40)
Albumin/Globulin Ratio: 1.4 (ref 1.2–2.2)
Albumin: 4 g/dL (ref 3.7–4.7)
Alkaline Phosphatase: 101 IU/L (ref 44–121)
BUN/Creatinine Ratio: 13 (ref 10–24)
BUN: 15 mg/dL (ref 8–27)
Bilirubin Total: 0.9 mg/dL (ref 0.0–1.2)
CO2: 19 mmol/L — ABNORMAL LOW (ref 20–29)
Calcium: 9.5 mg/dL (ref 8.6–10.2)
Chloride: 105 mmol/L (ref 96–106)
Creatinine, Ser: 1.17 mg/dL (ref 0.76–1.27)
Globulin, Total: 2.9 g/dL (ref 1.5–4.5)
Glucose: 142 mg/dL — ABNORMAL HIGH (ref 70–99)
Potassium: 3.9 mmol/L (ref 3.5–5.2)
Sodium: 140 mmol/L (ref 134–144)
Total Protein: 6.9 g/dL (ref 6.0–8.5)
eGFR: 61 mL/min/{1.73_m2} (ref >59)

## 2022-08-28 LAB — LIPID PANEL
Chol/HDL Ratio: 4 ratio (ref 0.0–5.0)
Cholesterol, Total: 190 mg/dL (ref 100–199)
HDL: 48 mg/dL (ref >39)
LDL Chol Calc (NIH): 122 mg/dL — ABNORMAL HIGH (ref 0–99)
Triglycerides: 110 mg/dL (ref 0–149)
VLDL Cholesterol Cal: 20 mg/dL (ref 5–40)

## 2022-08-28 NOTE — Patient Instructions (Addendum)
Medication Instructions:  Your physician recommends that you continue on your current medications as directed. Please refer to the Current Medication list given to you today.  **Please consider restarting a low dose statin medication for your mild coronary artery disease - rosuvastatin (Crestor) 5 mg daily If heart rate (pulse) remains > 100 beats per minute, would recommend that you restart metoprolol succinate (Toprol XL) 12.5 mg daily  *If you need a refill on your cardiac medications before your next appointment, please call your pharmacy*   Lab Work: TODAY - Lipid panel and complete metabolic panel If you have labs (blood work) drawn today and your tests are completely normal, you will receive your results only by: [place] (if you have MyChart) OR A paper copy in the mail If you have any lab test that is abnormal or we need to change your treatment, we will call you to review the results.   Testing/Procedures: None Ordered   Follow-Up: At [facility] you and your health needs are our priority.  As part of our continuing mission to provide you with exceptional heart care, we have created designated Provider Care Teams.  These Care Teams include your primary Cardiologist (physician) and Advanced Practice Providers (APPs -  Physician Assistants and Nurse Practitioners) who all work together to provide you with the care you need, when you need it.  We recommend signing up for the patient portal called "MyChart".  Sign up information is provided on this After Visit Summary.  MyChart is used to connect with patients for Virtual Visits (Telemedicine).  Patients are able to view lab/test results, encounter notes, upcoming appointments, etc.  Non-urgent messages can be sent to your provider as well.   To learn more about what you can do with MyChart, go to [email or web address].    Your next appointment:   3 month(s)  The format for your next appointment:   In  Person  Provider:   Christen Bame, NP    Other Instructions Adopting a Healthy Lifestyle.   Weight: Know what a healthy weight is for you (roughly BMI <25) and aim to maintain this. You can calculate your body mass index on your smart phone  Diet: Aim for 7+ servings of fruits and vegetables daily Limit animal fats in diet for cholesterol and heart health - choose grass fed whenever available Avoid highly processed foods (fast food burgers, tacos, fried chicken, pizza, hot dogs, french fries)  Saturated fat comes in the form of butter, lard, coconut oil, margarine, partially hydrogenated oils, and fat in meat. These increase your risk of cardiovascular disease.  Use healthy plant oils, such as olive, canola, soy, corn, sunflower and peanut.  Whole foods such as fruits, vegetables and whole grains have fiber  Men need > 38 grams of fiber per day Women need > 25 grams of fiber per day  Load up on vegetables and fruits - one-half of your plate: Aim for color and variety, and remember that potatoes dont count. Go for whole grains - one-quarter of your plate: Whole wheat, barley, wheat berries, quinoa, oats, brown rice, and foods made with them. If you want pasta, go with whole wheat pasta. Protein power - one-quarter of your plate: Fish, chicken, beans, and nuts are all healthy, versatile protein sources. Limit red meat. You need carbohydrates for energy! The type of carbohydrate is more important than the amount. Choose carbohydrates such as vegetables, fruits, whole grains, beans, and nuts in the place of white rice, white pasta,  potatoes (baked or fried), macaroni and cheese, cakes, cookies, and donuts.  If youre thirsty, drink water. Coffee and tea are good in moderation, but skip sugary drinks and limit milk and dairy products to one or two daily servings. Keep sugar intake at 6 teaspoons or 24 grams or LESS       Exercise: Aim for 150 min of moderate intensity exercise weekly for  heart health, and weights twice weekly for bone health Stay active - any steps are better than no steps! Aim for 7-9 hours of sleep daily        Important Information About Sugar

## 2022-08-31 NOTE — Progress Notes (Signed)
CT showed mild pulmonary fibrosis with mild air trapping. Make sure he discussed results at his follow-up on Wednesday with Dr. Silas Flood

## 2022-09-02 ENCOUNTER — Ambulatory Visit: Payer: Medicare PPO | Admitting: Pulmonary Disease

## 2022-09-02 ENCOUNTER — Encounter: Payer: Self-pay | Admitting: Pulmonary Disease

## 2022-09-02 VITALS — BP 120/78 | HR 97 | Temp 97.7°F | Ht 71.0 in | Wt 224.4 lb

## 2022-09-02 DIAGNOSIS — J849 Interstitial pulmonary disease, unspecified: Secondary | ICD-10-CM | POA: Diagnosis not present

## 2022-09-02 DIAGNOSIS — M6281 Muscle weakness (generalized): Secondary | ICD-10-CM | POA: Diagnosis not present

## 2022-09-02 DIAGNOSIS — R29898 Other symptoms and signs involving the musculoskeletal system: Secondary | ICD-10-CM | POA: Diagnosis not present

## 2022-09-02 MED ORDER — OXYCODONE HCL 5 MG PO CAPS: 5.0000 mg | ORAL_CAPSULE | Freq: Every day | ORAL | 0 refills | Status: DC | PRN

## 2022-09-02 NOTE — Progress Notes (Signed)
$'@Patient'm$  ID: Eric Chung, male    DOB: 03/15/1935, 88 y.o.   MRN: [id number]  Chief Complaint  Patient presents with   Follow-up    Sharp pains from lungs/chest    Referring provider: Mast, Man X, NP  HPI:   88 y.o. man whom we are seeing in follow-up of severe episode of chest pain.  Discharge summary 07/2022 reviewed.  Most recent cardiology note reviewed.  Pulmonary note Derl Barrow, NP 07/2022 reviewed.  He was in usual health.  Has chronic reflux.  Got up from dinner and walked away from the table.  Developed sudden onset of anterior chest pain that was preceded by brief moment of posterior, scapular pain.  Chest pain was described as severe, sharp and pressure-like.  Did not improve after a few minutes.  He decided to the ED.  He felt he could have passed out driving there probably should not have driven there.  Made to the ED.  Vital signs appear normal.  CTA PE protocol demonstrated no PE, mild possible fibrotic changes in the bases on my review and interpretation.  Multiple test run.  No clear explanation for the symptoms.  Pericarditis was initially felt but then cardiology felt less likely.  Symptoms resolved after GI cocktail and oxycodone.  He was discharged.  Followed up with Derl Barrow, NP.  No further issues.  Based on results of CT scan in the hospital, CT high-resolution was obtained.  This was 08/2022.  This was reviewed interpreted as faint basilar fibrotic changes bilaterally, otherwise clear lungs.  He has no significant shortness of breath.  No cough.  No respiratory issues.  Denies any recurrence of chest pain since.  He has ongoing follow-up with cardiology.  CT a coronaries reveals nonobstructive, nonflow limiting coronary disease.  He has upcoming appointment with GI.  He endorses chronic reflux.  Particular at night.  He feels regurgitant material in the back of his throat.  Occasionally gas and coughs with this.    Questionaires / Pulmonary Flowsheets:   ACT:       No data to display          MMRC:     No data to display          Epworth:      No data to display          Tests:   FENO:  No results found for: "NITRICOXIDE"  PFT:     No data to display          WALK:      No data to display          Imaging: Personally reviewed and as per EMR discussion in this note CT CHEST HIGH RESOLUTION  Result Date: 08/26/2022 CLINICAL DATA:  Interstitial lung disease, chronic cough EXAM: CT CHEST WITHOUT CONTRAST TECHNIQUE: Multidetector CT imaging of the chest was performed following the standard protocol without intravenous contrast. High resolution imaging of the lungs, as well as inspiratory and expiratory imaging, was performed. RADIATION DOSE REDUCTION: This exam was performed according to the departmental dose-optimization program which includes automated exposure control, adjustment of the mA and/or kV according to patient size and/or use of iterative reconstruction technique. COMPARISON:  07/26/2022 FINDINGS: Cardiovascular: Aortic atherosclerosis. Mild cardiomegaly. Left and right coronary artery calcifications. No pericardial effusion. Mediastinum/Nodes: No enlarged mediastinal, hilar, or axillary lymph nodes. Thyroid gland, trachea, and esophagus demonstrate no significant findings. Lungs/Pleura: Mild pulmonary fibrosis in a pattern with apical to basal gradient, featuring  irregular peripheral interstitial opacity, septal thickening, traction bronchiectasis and small areas of subpleural bronchiolectasis at the lung bases without clear evidence of honeycombing. Mild, lobular air trapping on expiratory phase imaging. No pleural effusion or pneumothorax. Upper Abdomen: No acute abnormality. Musculoskeletal: No chest wall abnormality. No acute osseous findings. IMPRESSION: 1. Mild pulmonary fibrosis in a pattern with apical to basal gradient, featuring irregular peripheral interstitial opacity, septal thickening, traction  bronchiectasis and small areas of subpleural bronchiolectasis at the lung bases without clear evidence of honeycombing. Findings are categorized as probable UIP per consensus guidelines: Diagnosis of Idiopathic Pulmonary Fibrosis: An Official ATS/ERS/JRS/ALAT Clinical Practice Guideline. [place], Iss 5, [phone], Apr 24 2017. 2. Mild, lobular air trapping on expiratory phase imaging, suggestive of small airways disease. 3. Coronary artery disease. Aortic Atherosclerosis (ICD10-I70.0). Electronically Signed   By: Delanna Ahmadi M.D.   On: 08/26/2022 19:05    Lab Results: Personally reviewed CBC    Component Value Date/Time   WBC 8.5 07/28/2022 0255   RBC 4.47 07/28/2022 0255   HGB 13.5 07/28/2022 0255   HCT 43.1 07/28/2022 0255   PLT 214 07/28/2022 0255   MCV 96.4 07/28/2022 0255   MCH 30.2 07/28/2022 0255   MCHC 31.3 07/28/2022 0255   RDW 14.9 07/28/2022 0255   LYMPHSABS 2.5 07/28/2022 0255   MONOABS 0.9 07/28/2022 0255   EOSABS 0.3 07/28/2022 0255   BASOSABS 0.0 07/28/2022 0255    BMET    Component Value Date/Time   NA 140 08/28/2022 1115   K 3.9 08/28/2022 1115   CL 105 08/28/2022 1115   CO2 19 (L) 08/28/2022 1115   GLUCOSE 142 (H) 08/28/2022 1115   GLUCOSE 112 (H) 07/28/2022 0255   BUN 15 08/28/2022 1115   CREATININE 1.17 08/28/2022 1115   CREATININE 1.11 10/05/2019 0700   CALCIUM 9.5 08/28/2022 1115   GFRNONAA 51 (L) 07/28/2022 0255   GFRNONAA 62 10/05/2019 0700   GFRAA 71 10/05/2019 0700    BNP    Component Value Date/Time   BNP 64.0 08/27/2017 0350    ProBNP No results found for: "PROBNP"  Specialty Problems       Pulmonary Problems   Sleep apnea in adult   Cough    10/05/19 uric acid 4.6, Hgb a1c 5.9, cholesterol 223, triglycerides 153, LDL 152, Na 137, K 4.1, Bun 16, creat 1.11, eGFR 62, wbc 9.1, Hgb 15.0, plt 262, neutrophils 42.5      Allergic rhinitis   Cough variant not due to asthma    03/01/20 IMPRESSION: Mild scarring  left base. Lungs otherwise clear. Heart upper normal in size. No adenopathy.      Chronic cough   ILD (interstitial lung disease) (HCC)    Allergies  Allergen Reactions   Penicillins     Has patient had a PCN reaction causing immediate rash, facial/tongue/throat swelling, SOB or lightheadedness with hypotension: yes, rash Has patient had a PCN reaction causing severe rash involving mucus membranes or skin necrosis: no Has patient had a PCN reaction that required hospitalization: no Has patient had a PCN reaction occurring within the last 10 years: No If all of the above answers are "NO", then may proceed with Cephalosporin use.     Immunization History  Administered Date(s) Administered   Fluad Quad(high Dose 65+) 06/07/2019, 06/09/2021   Influenza Whole 05/26/2018   Influenza, High Dose Seasonal PF 06/02/2017, 06/05/2020, 05/24/2022   Influenza-Unspecified 06/24/2016   Moderna SARS-COV2 Booster Vaccination 12/18/2020  Moderna Sars-Covid-2 Vaccination 08/28/2019, 09/25/2019, 07/02/2020   Pfizer Covid-19 Vaccine Bivalent Booster 58yr & up 05/13/2021, 04/09/2022   Pneumococcal Conjugate-13 09/12/2014   Pneumococcal Polysaccharide-23 12/17/2016   Pneumococcal-Unspecified 12/17/2016   Td 07/31/2016   Zoster Recombinat (Shingrix) 08/24/2005, 04/18/2019, 07/04/2019    Past Medical History:  Diagnosis Date   Apnea, sleep    Barrett's esophagus    Gout    Hypertension    Obesity    Pulmonary emboli (HCC)     Tobacco History: Social History   Tobacco Use  Smoking Status Former   Packs/day: 1.00   Years: 15.00   Total pack years: 15.00   Types: Cigarettes   Quit date: 08/24/1984   Years since quitting: 38.0  Smokeless Tobacco Never   Counseling given: Not Answered   Continue to not smoke  Outpatient Encounter Medications as of 09/02/2022  Medication Sig   allopurinol (ZYLOPRIM) 100 MG tablet TAKE ONE TABLET BY MOUTH TWICE DAILY   apixaban (ELIQUIS) 2.5 MG TABS  tablet Take 2.5 mg by mouth 2 (two) times a day.    aspirin EC 81 MG tablet Take 1 tablet (81 mg total) by mouth daily. Swallow whole.   empagliflozin (JARDIANCE) 10 MG TABS tablet Take by mouth daily.   fluticasone (FLONASE) 50 MCG/ACT nasal spray Place 1 spray into both nostrils daily.    latanoprost (XALATAN) 0.005 % ophthalmic solution Place 1 drop into both eyes at bedtime.   Multiple Vitamins-Minerals (MULTIVITAMIN WITH MINERALS) tablet Take 1 tablet by mouth daily.   nitroGLYCERIN (NITROSTAT) 0.4 MG SL tablet Place 1 tablet (0.4 mg total) under the tongue every 5 (five) minutes as needed for chest pain.   omeprazole (PRILOSEC) 20 MG capsule Take 20 mg by mouth daily.   oxycodone (OXY-IR) 5 MG capsule Take 1 capsule (5 mg total) by mouth daily as needed (chest pain, if not better after pill seek emergency care).   Probiotic Product (PROBIOTIC DAILY PO) Take 1 capsule by mouth daily.   [DISCONTINUED] HYDROcodone-acetaminophen (NORCO/VICODIN) 5-325 MG tablet Take 1-2 tablets by mouth every 6 (six) hours as needed. (Patient taking differently: Take 1-2 tablets by mouth every 6 (six) hours as needed for moderate pain.)   No facility-administered encounter medications on file as of 09/02/2022.     Review of Systems  Review of Systems  No chest pain exertion.  No orthopnea or PND.  Comprehensive review of systems otherwise negative. Physical Exam  BP 120/78   Pulse 97   Temp 97.7 F (36.5 C) (Oral)   Ht '5\' 11"'$  (1.803 m)   Wt 224 lb 6.4 oz (101.8 kg)   SpO2 93%   BMI 31.30 kg/m   Wt Readings from Last 5 Encounters:  09/02/22 224 lb 6.4 oz (101.8 kg)  08/28/22 227 lb 12.8 oz (103.3 kg)  08/26/22 227 lb (103 kg)  08/05/22 225 lb 3.2 oz (102.2 kg)  07/29/22 231 lb (104.8 kg)    BMI Readings from Last 5 Encounters:  09/02/22 31.30 kg/m  08/28/22 31.77 kg/m  08/26/22 31.66 kg/m  08/05/22 31.41 kg/m  07/29/22 32.22 kg/m     Physical Exam General: Sitting in chair, no  acute distress Eyes: EOMI, no icterus Neck: Supple, no JVP Pulmonary: In-store crackles in the bases, otherwise clear Cardiovascular warm, no edema Abdomen: Nondistended, bowel sounds present MSK: No synovitis, no joint effusion Neuro: Normal gait, no weakness Psych: Normal mood, full affect   Assessment & Plan:   Severe chest pain: Collision is sharp and  pressure, isolated incident 07/2022.  Monitored in the hospital.  CTA without PE.  Cardiology consulted.  Subsequent cardiology workup seems reassuring.  High-res CT scan obtained in the interim with mild fibrosis like in the setting of chronic aspiration, bibasilar.  No etiology for chest pain.  Chest pain has not recurred.  Suspect esophageal spasm, GI in nature given his preceding GI history.  Encouraged ongoing GI and cardiology follow-up.  Small supply of oxycodone provided in case happens again.  If not relieved he needs to seek immediate emergency care.  Bibasilar pulmonary fibrosis: History of clear significant reflux as well as aspiration symptoms of reflux contents.  High suspicion for chronic aspiration leading to bibasilar mild fibrosis.  No significant symptoms.  Follow-up as needed based on symptoms.  No further workup at this time.  Return if symptoms worsen or fail to improve.   Lanier Clam, MD 09/02/2022   This appointment required 60 minutes of patient care (this includes precharting, chart review, review of results, face-to-face care, etc.).

## 2022-09-02 NOTE — Patient Instructions (Signed)
Nice to meet you  There is moderate scarring on your CT scan is likely related to chronic reflux and mild aspiration over time  There is no explanation for the chest pain on your CT scan.  I agree I think this is likely esophageal in nature.  I provided a small amount of oxycodone, if this recurs.  If the pain does not improve with the oxycodone you must seek emergency care.  Continue follow-up with cardiology and GI as scheduled  Return to clinic as needed

## 2022-09-04 ENCOUNTER — Telehealth: Payer: Self-pay | Admitting: *Deleted

## 2022-09-04 ENCOUNTER — Other Ambulatory Visit: Payer: Self-pay | Admitting: Gastroenterology

## 2022-09-04 DIAGNOSIS — K2270 Barrett's esophagus without dysplasia: Secondary | ICD-10-CM | POA: Diagnosis not present

## 2022-09-04 DIAGNOSIS — R29898 Other symptoms and signs involving the musculoskeletal system: Secondary | ICD-10-CM | POA: Diagnosis not present

## 2022-09-04 DIAGNOSIS — K219 Gastro-esophageal reflux disease without esophagitis: Secondary | ICD-10-CM | POA: Diagnosis not present

## 2022-09-04 DIAGNOSIS — M6281 Muscle weakness (generalized): Secondary | ICD-10-CM | POA: Diagnosis not present

## 2022-09-04 NOTE — Telephone Encounter (Signed)
   Pre-operative Risk Assessment    Patient Name: Eric Chung  DOB: [birth year] MRN: [id number]      Request for Surgical Clearance    Procedure:   COLONOSCOPY  Date of Surgery:  Clearance 09/29/22                                 Surgeon:  DR. [facility] Surgeon's Group or Practice Name:  [facility] Phone number:  [phone] Fax number:  [phone]   Type of Clearance Requested:   - Pharmacy:  Hold Apixaban (Eliquis) NOT INDICATED HOW LONG   Type of Anesthesia:  Not Indicated   Additional requests/questions:    Astrid Divine   09/04/2022, 2:56 PM

## 2022-09-04 NOTE — Telephone Encounter (Signed)
Patient is on Eliquis for history of DVT/PE, therefore clearance for holding Eliquis for upcoming procedure will need to be sent to prescribing provider.  In regards to medical clearance, I saw the patient in the office on 08/28/22 at which time he was able to achieve > 4 METS activity without cardiac symptoms. His RCRI for MACE is Class II (0.9%) due to history of CHF.  Emmaline Life, NP-C  09/04/2022, 3:33 PM [address], [address] Office [phone] Fax [phone]

## 2022-09-09 ENCOUNTER — Non-Acute Institutional Stay (INDEPENDENT_AMBULATORY_CARE_PROVIDER_SITE_OTHER): Payer: Medicare PPO | Admitting: Family Medicine

## 2022-09-09 ENCOUNTER — Encounter: Payer: Self-pay | Admitting: Family Medicine

## 2022-09-09 VITALS — BP 102/60 | HR 60 | Temp 97.3°F | Ht 71.0 in | Wt 226.6 lb

## 2022-09-09 DIAGNOSIS — K2270 Barrett's esophagus without dysplasia: Secondary | ICD-10-CM | POA: Diagnosis not present

## 2022-09-09 DIAGNOSIS — I2699 Other pulmonary embolism without acute cor pulmonale: Secondary | ICD-10-CM

## 2022-09-09 NOTE — Progress Notes (Signed)
Provider:  Alain Honey, MD  Careteam: Patient Care Team: Mast, Man X, NP as PCP - General (Internal Medicine) Early Osmond, MD as PCP - Cardiology (Cardiology)  PLACE OF SERVICE:  [place]  Advanced Directive information    Allergies  Allergen Reactions   Penicillins     Has patient had a PCN reaction causing immediate rash, facial/tongue/throat swelling, SOB or lightheadedness with hypotension: yes, rash Has patient had a PCN reaction causing severe rash involving mucus membranes or skin necrosis: no Has patient had a PCN reaction that required hospitalization: no Has patient had a PCN reaction occurring within the last 10 years: No If all of the above answers are "NO", then may proceed with Cephalosporin use.     Chief Complaint  Patient presents with   Medical Management of Chronic Issues    Patient presents today for 6 weeks follow-up     HPI: Patient is a 88 y.o. male .  Patient was seen for diarrhea about 5 weeks ago.  He requested to see gastroenterology for evaluation so that referral was made.  In the meantime I had asked him to try align probiotic for the diarrhea. Also has a history of Barrett's esophagus for which she takes a PPI.  He is scheduled for upper endoscopy to evaluate that in the near future. The align has helped his diarrhea, in fact he has had no further episodes. Had also discontinued some of his "heart" meds including verapamil.  He is also stopped Jardiance but has since restarted that at the request of his heart doctor  Review of Systems:  Review of Systems  Constitutional: Negative.   HENT: Negative.    Respiratory: Negative.    Cardiovascular: Negative.   Gastrointestinal:  Negative for diarrhea.  Skin: Negative.     Past Medical History:  Diagnosis Date   Apnea, sleep    Barrett's esophagus    Gout    Hypertension    Obesity    Pulmonary emboli [facility])    Past Surgical History:  Procedure Laterality Date    CHOLECYSTECTOMY  08/29/2017   EYE SURGERY Bilateral 2011-2012   catracts removed   HERNIA REPAIR  2005   T. Carlton Adam MD   TONSILECTOMY/ADENOIDECTOMY WITH MYRINGOTOMY  1944   Social History:   reports that he quit smoking about 38 years ago. His smoking use included cigarettes. He has a 15.00 pack-year smoking history. He has never used smokeless tobacco. He reports current alcohol use of about 3.0 - 4.0 standard drinks of alcohol per week. He reports that he does not currently use drugs.  Family History  Problem Relation Age of Onset   Osteoporosis Mother    Cancer Father 14       esophagus   Crohn's disease Sister    Birth defects Maternal Grandmother        colon   Diabetes Paternal Grandmother     Medications: Patient's Medications  New Prescriptions   No medications on file  Previous Medications   ALLOPURINOL (ZYLOPRIM) 100 MG TABLET    TAKE ONE TABLET BY MOUTH TWICE DAILY   APIXABAN (ELIQUIS) 2.5 MG TABS TABLET    Take 2.5 mg by mouth 2 (two) times a day.    ASPIRIN EC 81 MG TABLET    Take 1 tablet (81 mg total) by mouth daily. Swallow whole.   EMPAGLIFLOZIN (JARDIANCE) 10 MG TABS TABLET    Take by mouth daily.   FLUTICASONE (FLONASE) 50 MCG/ACT NASAL SPRAY  Place 1 spray into both nostrils daily.    LATANOPROST (XALATAN) 0.005 % OPHTHALMIC SOLUTION    Place 1 drop into both eyes at bedtime.   MULTIPLE VITAMINS-MINERALS (MULTIVITAMIN WITH MINERALS) TABLET    Take 1 tablet by mouth daily.   NITROGLYCERIN (NITROSTAT) 0.4 MG SL TABLET    Place 1 tablet (0.4 mg total) under the tongue every 5 (five) minutes as needed for chest pain.   OMEPRAZOLE (PRILOSEC) 20 MG CAPSULE    Take 20 mg by mouth daily.   OXYCODONE (OXY-IR) 5 MG CAPSULE    Take 1 capsule (5 mg total) by mouth daily as needed (chest pain, if not better after pill seek emergency care).   PROBIOTIC PRODUCT (PROBIOTIC DAILY PO)    Take 1 capsule by mouth daily.  Modified Medications   No medications on file   Discontinued Medications   No medications on file    Physical Exam:  Vitals:   09/09/22 1356  BP: 102/60  Pulse: 60  Temp: (!) 97.3 F (36.3 C)  SpO2: 96%  Weight: 226 lb 9.6 oz (102.8 kg)  Height: '5\' 11"'$  (1.803 m)   Body mass index is 31.6 kg/m. Wt Readings from Last 3 Encounters:  09/09/22 226 lb 9.6 oz (102.8 kg)  09/02/22 224 lb 6.4 oz (101.8 kg)  08/28/22 227 lb 12.8 oz (103.3 kg)    Physical Exam Vitals and nursing note reviewed.  Constitutional:      Appearance: Normal appearance.  Cardiovascular:     Rate and Rhythm: Normal rate and regular rhythm.  Pulmonary:     Effort: Pulmonary effort is normal.     Breath sounds: Normal breath sounds.  Abdominal:     General: There is no distension.     Palpations: Abdomen is soft.     Tenderness: There is no abdominal tenderness. There is no guarding.  Neurological:     Mental Status: He is alert.     Labs reviewed: Basic Metabolic Panel: Recent Labs    07/27/22 0445 07/28/22 0255 08/28/22 1115  NA 136 133* 140  K 4.5 4.2 3.9  CL 107 105 105  CO2 23 22 19*  GLUCOSE 146* 112* 142*  BUN '17 23 15  '$ CREATININE 1.12 1.36* 1.17  CALCIUM 8.6* 8.3* 9.5  MG  --  2.0  --   PHOS  --  3.7  --   TSH  --  1.100  --    Liver Function Tests: Recent Labs    05/23/22 1304 07/28/22 0255 08/28/22 1115  AST 20 13* 15  ALT '14 10 11  '$ ALKPHOS 79 64 101  BILITOT 1.0 1.6* 0.9  PROT 7.4 6.4* 6.9  ALBUMIN 4.0 3.0* 4.0   No results for input(s): "LIPASE", "AMYLASE" in the last 8760 hours. No results for input(s): "AMMONIA" in the last 8760 hours. CBC: Recent Labs    07/24/22 0927 07/26/22 2122 07/27/22 0445 07/28/22 0255  WBC 6.8 11.0* 9.7 8.5  NEUTROABS 3.4  --   --  4.7  HGB 15.3 15.1 14.3 13.5  HCT 46.7 46.3 44.4 43.1  MCV 93.0 94.7 95.1 96.4  PLT 235 241 212 214   Lipid Panel: Recent Labs    08/28/22 1115  CHOL 190  HDL 48  LDLCALC 122*  TRIG 110  CHOLHDL 4.0   TSH: Recent Labs     07/28/22 0255  TSH 1.100   A1C: Lab Results  Component Value Date   HGBA1C 5.9 (H) 10/05/2019  Assessment/Plan  1. Other acute pulmonary embolism without acute cor pulmonale (HCC) Continue with Eliquis indefinitely  2. Barrett's esophagus without dysplasia To have upper endoscopy soon Diarrhea that he had previously complained of is resolved with a probiotic   Alain Honey, MD [place] [phone]

## 2022-09-10 ENCOUNTER — Encounter: Payer: Self-pay | Admitting: Nurse Practitioner

## 2022-09-10 ENCOUNTER — Ambulatory Visit: Payer: Medicare PPO | Admitting: Nurse Practitioner

## 2022-09-10 VITALS — BP 130/84 | HR 79 | Temp 97.1°F | Resp 18 | Ht 71.0 in | Wt 228.0 lb

## 2022-09-10 DIAGNOSIS — Z0000 Encounter for general adult medical examination without abnormal findings: Secondary | ICD-10-CM | POA: Diagnosis not present

## 2022-09-10 NOTE — Progress Notes (Signed)
Subjective:   Eric Chung is a 88 y.o. male who presents for Medicare Annual/Subsequent preventive examination @ clinic [place].  Cardiac Risk Factors include: advanced age (>50mn, >>44women);dyslipidemia;hypertension;male gender;obesity (BMI >30kg/m2)     Objective:    Today's Vitals   09/10/22 1524  BP: 130/84  Pulse: 79  Resp: 18  Temp: (!) 97.1 F (36.2 C)  SpO2: 96%  Weight: 228 lb (103.4 kg)  Height: '5\' 11"'$  (1.803 m)   Body mass index is 31.8 kg/m.     09/10/2022    3:10 PM 08/27/2022    8:09 AM 07/27/2022    6:00 PM 07/26/2022    8:56 PM 07/24/2022    8:21 AM 05/23/2022   12:29 PM 03/25/2022    3:39 PM  Advanced Directives  Does Patient Have a Medical Advance Directive? Yes Yes  Yes Yes No Yes  Type of Advance Directive Living will Living will H[place]of ACut and ShootLiving will   HY[place]ccaLiving will  Does patient want to make changes to medical advance directive?  No - Patient declined No - Patient declined  No - Patient declined  No - Patient declined  Copy of HOrangein Chart?       Yes - validated most recent copy scanned in chart (See row information)  Would patient like information on creating a medical advance directive?      No - Patient declined     Current Medications (verified) Outpatient Encounter Medications as of 09/10/2022  Medication Sig   allopurinol (ZYLOPRIM) 100 MG tablet TAKE ONE TABLET BY MOUTH TWICE DAILY   apixaban (ELIQUIS) 2.5 MG TABS tablet Take 2.5 mg by mouth 2 (two) times a day.    aspirin EC 81 MG tablet Take 1 tablet (81 mg total) by mouth daily. Swallow whole.   empagliflozin (JARDIANCE) 10 MG TABS tablet Take by mouth daily.   fluticasone (FLONASE) 50 MCG/ACT nasal spray Place 1 spray into both nostrils daily.    latanoprost (XALATAN) 0.005 % ophthalmic solution Place 1 drop into both eyes at bedtime.   Multiple Vitamins-Minerals  (MULTIVITAMIN WITH MINERALS) tablet Take 1 tablet by mouth daily.   nitroGLYCERIN (NITROSTAT) 0.4 MG SL tablet Place 1 tablet (0.4 mg total) under the tongue every 5 (five) minutes as needed for chest pain.   omeprazole (PRILOSEC) 20 MG capsule Take 20 mg by mouth daily.   oxycodone (OXY-IR) 5 MG capsule Take 1 capsule (5 mg total) by mouth daily as needed (chest pain, if not better after pill seek emergency care).   Probiotic Product (PROBIOTIC DAILY PO) Take 1 capsule by mouth daily.   No facility-administered encounter medications on file as of 09/10/2022.    Allergies (verified) Penicillins   History: Past Medical History:  Diagnosis Date   Apnea, sleep    Barrett's esophagus    Gout    Hypertension    Obesity    Pulmonary emboli ([facility]    Past Surgical History:  Procedure Laterality Date   CHOLECYSTECTOMY  08/29/2017   EYE SURGERY Bilateral 2011-2012   catracts removed   HERNIA REPAIR  2005   T. TCarlton AdamMD   TONSILECTOMY/ADENOIDECTOMY WITH MYRINGOTOMY  1944   Family History  Problem Relation Age of Onset   Osteoporosis Mother    Cancer Father 826      esophagus   Crohn's disease Sister    Birth defects Maternal Grandmother  colon   Diabetes Paternal Grandmother    Social History   Socioeconomic History   Marital status: Married    Spouse name: Inez Catalina   Number of children: 1   Years of education: Not on file   Highest education level: Not on file  Occupational History   Not on file  Tobacco Use   Smoking status: Former    Packs/day: 1.00    Years: 15.00    Total pack years: 15.00    Types: Cigarettes    Quit date: 08/24/1984    Years since quitting: 38.0   Smokeless tobacco: Never  Vaping Use   Vaping Use: Never used  Substance and Sexual Activity   Alcohol use: Yes    Alcohol/week: 3.0 - 4.0 standard drinks of alcohol    Types: 3 - 4 Standard drinks or equivalent per week    Comment: 2-3 beers a month   Drug use: Not Currently   Sexual  activity: Not Currently  Other Topics Concern   Not on file  Social History Narrative   Social History     Marital status: Married ,1986            Spouse name: Inez Catalina                    Years of education:                 Number of children: 1                Occupational History: Pharmacist, hospital     None on file      Social History Main Topics     Smoking status:                       Smokeless tobacco: Not on file                        Alcohol use: 3-4 drinks per week     Drug use: Unknown     Sexual activity: Not on file            Other Topics            Concern     None on file      Social History Narrative     None on file            Social Determinants of Health   Financial Resource Strain: Low Risk  (07/11/2018)   Overall Financial Resource Strain (CARDIA)    Difficulty of Paying Living Expenses: Not hard at all  Food Insecurity: No Food Insecurity (07/27/2022)   Hunger Vital Sign    Worried About Running Out of Food in the Last Year: Never true    Ran Out of Food in the Last Year: Never true  Transportation Needs: No Transportation Needs (07/27/2022)   PRAPARE - Hydrologist (Medical): No    Lack of Transportation (Non-Medical): No  Physical Activity: Inactive (07/11/2018)   Exercise Vital Sign    Days of Exercise per Week: 0 days    Minutes of Exercise per Session: 0 min  Stress: No Stress Concern Present (07/11/2018)   [place]    Feeling of Stress : Only a little  Social Connections: Socially Integrated (07/11/2018)   Social Connection and Isolation Panel [NHANES]    Frequency of Communication with Friends and  Family: More than three times a week    Frequency of Social Gatherings with Friends and Family: More than three times a week    Attends Religious Services: More than 4 times per year    Active Member of [employer] or Organizations: Yes    Attends Arts administrator: More than 4 times per year    Marital Status: Married    Tobacco Counseling Counseling given: Not Answered   Clinical Intake:  Pre-visit preparation completed: Yes  Pain : No/denies pain     BMI - recorded: 31.6 Nutritional Status: BMI > 30  Obese Nutritional Risks: None Diabetes: No  How often do you need to have someone help you when you read instructions, pamphlets, or other written materials from your doctor or pharmacy?: 1 - Never What is the last grade level you completed in school?: ABD x2  Diabetic?no  Interpreter Needed?: No  Information entered by :: Cherylene Ferrufino Bretta Bang NP   Activities of Daily Living    09/10/2022    3:32 PM 07/27/2022    6:00 PM  In your present state of health, do you have any difficulty performing the following activities:  Hearing? 0 0  Vision? 0 0  Difficulty concentrating or making decisions? 0 0  Walking or climbing stairs? 0 0  Dressing or bathing? 0 0  Doing errands, shopping? 0 0  Preparing Food and eating ? N   Using the Toilet? N   In the past six months, have you accidently leaked urine? N   Do you have problems with loss of bowel control? N   Managing your Medications? N   Managing your Finances? N   Housekeeping or managing your Housekeeping? N     Patient Care Team: Fabian Coca X, NP as PCP - General (Internal Medicine) Early Osmond, MD as PCP - Cardiology (Cardiology)  Indicate any recent Medical Services you may have received from other than [facility] providers in the past year (date may be approximate).     Assessment:   This is a routine wellness examination for [facility].  Hearing/Vision screen No results found.  Dietary issues and exercise activities discussed: Current Exercise Habits: The patient does not participate in regular exercise at present, Exercise limited by: cardiac condition(s);neurologic condition(s)   Goals Addressed             This Visit's Progress    Activity and Exercise  Increased       Evidence-based guidance:  Review current exercise levels.  Assess patient perspective on exercise or activity level, barriers to increasing activity, motivation and readiness for change.  Recommend or set healthy exercise goal based on individual tolerance.  Encourage small steps toward making change in amount of exercise or activity.  Urge reduction of sedentary activities or screen time.  Promote group activities within the community or with family or support person.  Consider referral to rehabiliation therapist for assessment and exercise/activity plan.   Notes:        Depression Screen    07/03/2021    1:33 PM 10/01/2020    8:40 AM 07/11/2018   10:33 AM 05/27/2017    9:55 AM  PHQ 2/9 Scores  PHQ - 2 Score 0 0 0 0    Fall Risk    09/10/2022    3:10 PM 03/25/2022    3:39 PM 07/03/2021    1:33 PM 10/01/2020    8:42 AM 09/26/2020    3:22 PM  Fall Risk  Falls in the past year? 0 0 0 0 0  Number falls in past yr: 0 0 0 0 0  Injury with Fall? 0 0 0 0   Risk for fall due to : No Fall Risks No Fall Risks No Fall Risks    Follow up  Falls evaluation completed Falls evaluation completed      Dinosaur:  Any stairs in or around the home? Yes  If so, are there any without handrails? No  Home free of loose throw rugs in walkways, pet beds, electrical cords, etc? Yes  Adequate lighting in your home to reduce risk of falls? Yes   ASSISTIVE DEVICES UTILIZED TO PREVENT FALLS:  Life alert? No  Use of a cane, walker or w/c? No  Grab bars in the bathroom? Yes  Shower chair or bench in shower? Yes  Elevated toilet seat or a handicapped toilet? Yes   TIMED UP AND GO:  Was the test performed? No .    Gait steady and fast without use of assistive device  Cognitive Function:    09/10/2022    3:23 PM 07/11/2018   10:55 AM 05/27/2017   10:22 AM  MMSE - Mini Mental State Exam  Orientation to time '5 5 4  '$ Orientation to Place '5 5 5   '$ Registration '3 3 3  '$ Attention/ Calculation '5 5 5  '$ Recall '3 3 3  '$ Language- name 2 objects '2 2 2  '$ Language- repeat '1 1 1  '$ Language- follow 3 step command '3 3 2  '$ Language- read & follow direction '1 1 1  '$ Write a sentence '1 1 1  '$ Copy design '1 1 1  '$ Total score '30 30 28        '$ 10/01/2020    8:44 AM  6CIT Screen  What Year? 0 points  What month? 0 points  What time? 0 points  Count back from 20 0 points  Months in reverse 0 points  Repeat phrase 0 points  Total Score 0 points    Immunizations Immunization History  Administered Date(s) Administered   Fluad Quad(high Dose 65+) 06/07/2019, 06/09/2021   Influenza Whole 05/26/2018   Influenza, High Dose Seasonal PF 06/02/2017, 06/05/2020, 05/24/2022   Influenza-Unspecified 06/24/2016   Moderna SARS-COV2 Booster Vaccination 12/18/2020   Moderna Sars-Covid-2 Vaccination 08/28/2019, 09/25/2019, 07/02/2020   Pfizer Covid-19 Vaccine Bivalent Booster 22yr & up 05/13/2021, 04/09/2022   Pneumococcal Conjugate-13 09/12/2014   Pneumococcal Polysaccharide-23 12/17/2016   Pneumococcal-Unspecified 12/17/2016   Td 07/31/2016   Zoster Recombinat (Shingrix) 08/24/2005, 04/18/2019, 07/04/2019    TDAP status: Up to date  Flu Vaccine status: Up to date  Pneumococcal vaccine status: Up to date  Covid-19 vaccine status: Information provided on how to obtain vaccines.   Qualifies for Shingles Vaccine? Yes   Zostavax completed Yes   Shingrix Completed?: Yes  Screening Tests Health Maintenance  Topic Date Due   COVID-19 Vaccine (7 - 2023-24 season) 06/04/2022   Medicare Annual Wellness (AWV)  09/11/2023   DTaP/Tdap/Td (2 - Tdap) 07/31/2026   Pneumonia Vaccine 88 Years old  Completed   INFLUENZA VACCINE  Completed   Zoster Vaccines- Shingrix  Completed   HPV VACCINES  Aged Out    Health Maintenance  Health Maintenance Due  Topic Date Due   COVID-19 Vaccine (7 - 2023-24 season) 06/04/2022    Colorectal cancer screening: No longer  required.   Lung Cancer Screening: (Low Dose CT Chest recommended if Age 86-80years, 30 pack-year  currently smoking OR have quit w/in 15years.) does not qualify.     Additional Screening:  Hepatitis C Screening: does not qualify;   Vision Screening: Recommended annual ophthalmology exams for early detection of glaucoma and other disorders of the eye. Is the patient up to date with their annual eye exam?  Yes  Who is the provider or what is the name of the office in which the patient attends annual eye exams? Dr. Valetta Close If pt is not established with a provider, would they like to be referred to a provider to establish care? No .   Dental Screening: Recommended annual dental exams for proper oral hygiene  Community Resource Referral / Chronic Care Management: CRR required this visit?  No   CCM required this visit?  No      Plan:     I have personally reviewed and noted the following in the patient's chart:   Medical and social history Use of alcohol, tobacco or illicit drugs  Current medications and supplements including opioid prescriptions. Patient is not currently taking opioid prescriptions. Functional ability and status Nutritional status Physical activity Advanced directives List of other physicians Hospitalizations, surgeries, and ER visits in previous 12 months Vitals Screenings to include cognitive, depression, and falls Referrals and appointments  In addition, I have reviewed and discussed with patient certain preventive protocols, quality metrics, and best practice recommendations. A written personalized care plan for preventive services as well as general preventive health recommendations were provided to patient.     Tyron Manetta X Myangel Summons, NP   09/11/2022

## 2022-09-11 ENCOUNTER — Encounter: Payer: Self-pay | Admitting: Nurse Practitioner

## 2022-09-11 DIAGNOSIS — R29898 Other symptoms and signs involving the musculoskeletal system: Secondary | ICD-10-CM | POA: Diagnosis not present

## 2022-09-11 DIAGNOSIS — M6281 Muscle weakness (generalized): Secondary | ICD-10-CM | POA: Diagnosis not present

## 2022-09-15 DIAGNOSIS — R29898 Other symptoms and signs involving the musculoskeletal system: Secondary | ICD-10-CM | POA: Diagnosis not present

## 2022-09-15 DIAGNOSIS — M6281 Muscle weakness (generalized): Secondary | ICD-10-CM | POA: Diagnosis not present

## 2022-09-18 DIAGNOSIS — R29898 Other symptoms and signs involving the musculoskeletal system: Secondary | ICD-10-CM | POA: Diagnosis not present

## 2022-09-18 DIAGNOSIS — M6281 Muscle weakness (generalized): Secondary | ICD-10-CM | POA: Diagnosis not present

## 2022-09-22 ENCOUNTER — Encounter (HOSPITAL_COMMUNITY): Payer: Self-pay | Admitting: Gastroenterology

## 2022-09-22 DIAGNOSIS — M6281 Muscle weakness (generalized): Secondary | ICD-10-CM | POA: Diagnosis not present

## 2022-09-22 DIAGNOSIS — R29898 Other symptoms and signs involving the musculoskeletal system: Secondary | ICD-10-CM | POA: Diagnosis not present

## 2022-09-22 NOTE — Progress Notes (Signed)
Attempted to obtain medical history via telephone, unable to reach at this time. HIPAA compliant voicemail message left requesting return call to pre surgical testing department.  

## 2022-09-24 DIAGNOSIS — M6281 Muscle weakness (generalized): Secondary | ICD-10-CM | POA: Diagnosis not present

## 2022-09-24 DIAGNOSIS — R29898 Other symptoms and signs involving the musculoskeletal system: Secondary | ICD-10-CM | POA: Diagnosis not present

## 2022-09-28 DIAGNOSIS — R29898 Other symptoms and signs involving the musculoskeletal system: Secondary | ICD-10-CM | POA: Diagnosis not present

## 2022-09-28 DIAGNOSIS — M6281 Muscle weakness (generalized): Secondary | ICD-10-CM | POA: Diagnosis not present

## 2022-09-28 NOTE — H&P (Signed)
History of Present Illness General:         88 year old male, history of CKD, DVT, OSA, Barrett's esophagus, cardiomyopathy, remote polysubstance abuse, presents for evaluation of hospital follow-up.        Recently admitted to hospital 07/26/2022 to 07/28/2022 for chest pain.        Ruled out ACS likely pleuritic however has a component of pericarditis possibly.        Patient is anticoagulated on Eliquis for history of PE and DVT        CTA was done and cardiology feels that chest pain was GI in origin or possibly pulmonary/pleuritic in nature.        CTA showed questionable early interstitial lung disease versus sequela of prior aspiration. Small hiatal hernia and patulous distal esophagus, possibly indicative of esophageal dysmotility.        Reports colonoscopy in 2015 in [place] with 2 tubular adenomas removed        Colonoscopy 12/2016 showed 4 sessile polyps (sessile serrated adenoma) removed and 5 sessile polyps removed from rectum.        EGD from 12/2016 showed long segment Barrett's, 40 cm in length, evidence of dysplasia absent. No H. pylori. Recommended EGD repeat in 2021        At last visit with Dr. Therisa Doyne in 2019 patient had poor control of GERD and was recommended daily PPI and EGD/colonoscopy in 2021        CMP shows GFR 51, BUN 23, creatinine 1.36        CBC normal        TSH normal        patient states he has had reflux for 40 years but has never had an intense episodes. states it was horizonatally across chest. caused him to be nauseous.        felt presyncopal        on omeprazole '20mg'$ . overall good controll unless doesn't stick to good diet.        his reflux is typically at night. he will aspirate a little of it and feels like he has to cough it up. typically only occurs when he eats too late or eats trigger.        denies dysphagia        ECHO 07/2022 shows EF 35 to 40%        not interested in getting colonoscopy at this time. Vital Signs Wt: 225.6, Wt change: -13.4  lbs, Ht: 71, BMI: 31.46, Temp: 97.7, Pulse sitting: 92, BP sitting: 100/65. Examination Gastroenterology Exam:        GENERAL APPEARANCE: Well developed, well nourished, no active distress, pleasant, no acute distress. appears younger than stated age. appears in decent health. EYES: Lids and conjunctiva normal. Sclera normal, pupils equal and reactive. RESPIRATORY Breath sounds normal. Respiration even and unlabored. CARDIOVASCULAR Normal RRR w/o murmers or gallops. No peripheral edema. ABDOMEN No masses palpated. Liver and spleen not palpated, normal. Bowel sounds normal, Abdomen not distended. SKIN Warm and dry. PSYCHIATRIC Alert and oriented x3, mood and affect appear normal..  Assessments 1. Barrett's esophagus determined by biopsy - K22.70 (Primary) 2. Gastroesophageal reflux disease, unspecified whether esophagitis present - K21.9 Treatment 1. Barrett's esophagus determined by biopsy       IMAGING: EGD Orbie Pyo 09/04/2022 09:51:53 AM > Scheduled for 09/29/22 at [facility] hosp-Prep inst given to pt. Faxed Eliquis Cease to Dr Ali Lowe.  Clinical Notes: due for repeat EGD. having GERD breakthrough symptoms when not  adhering to diet. episode of chest pain that had extensive cardiac workup and was determined to be GI etiology.  EGD for further evaluation. recommend increase omeprazole to '40mg'$  daily for 2 months. educated patient on lifestyle modifications.  The procedure EGD was thoroughly explained to the patient, including the risks, benefits and alternatives. We discussed the complications including Bleeding and perforation, sedation problems and missing something. The patient verbalized understanding and agree.  due to EF will get done at hospital. will hold blood thinner with Dr. Ali Lowe. patient appears to be in great health for age.    2. Gastroesophageal reflux disease, unspecified whether esophagitis present  Start Omeprazole Capsule Delayed Release, 40 MG, 1 capsule 30 minutes before  morning meal, Orally, Once a day, 30 days, 30, Refills 0      IMAGING: EGD Orbie Pyo 09/04/2022 09:51:53 AM > Scheduled for 09/29/22 at [facility] hosp-Prep inst given to pt. Faxed Eliquis Cease to Dr Ali Lowe.  Clinical Notes: due for repeat EGD. having GERD breakthrough symptoms when not adhering to diet. episode of chest pain that had extensive cardiac workup and was determined to be GI etiology.  EGD for further evaluation. recommend increase omeprazole to '40mg'$  daily for 2 months. educated patient on lifestyle modifications.  The procedure EGD was thoroughly explained to the patient, including the risks, benefits and alternatives. We discussed the complications including Bleeding and perforation, sedation problems and missing something. The patient verbalized understanding and agree.  due to EF will get done at hospital. will hold blood thinner with Dr. Ali Lowe. patient appears to be in great health for age.

## 2022-09-29 ENCOUNTER — Ambulatory Visit (HOSPITAL_COMMUNITY)
Admission: RE | Admit: 2022-09-29 | Discharge: 2022-09-29 | Disposition: A | Discharge status: Home / Self Care | Payer: Medicare PPO | Attending: Gastroenterology | Admitting: Gastroenterology

## 2022-09-29 ENCOUNTER — Encounter (HOSPITAL_COMMUNITY): Payer: Self-pay | Admitting: Gastroenterology

## 2022-09-29 ENCOUNTER — Ambulatory Visit (HOSPITAL_BASED_OUTPATIENT_CLINIC_OR_DEPARTMENT_OTHER): Payer: Medicare PPO | Admitting: Certified Registered"

## 2022-09-29 ENCOUNTER — Other Ambulatory Visit: Payer: Self-pay

## 2022-09-29 ENCOUNTER — Encounter (HOSPITAL_COMMUNITY)
Admission: RE | Disposition: A | Discharge status: Home / Self Care | Payer: Self-pay | Source: Home / Self Care | Attending: Gastroenterology

## 2022-09-29 ENCOUNTER — Ambulatory Visit (HOSPITAL_COMMUNITY): Payer: Medicare PPO | Admitting: Certified Registered"

## 2022-09-29 DIAGNOSIS — Z87891 Personal history of nicotine dependence: Secondary | ICD-10-CM

## 2022-09-29 DIAGNOSIS — Z79899 Other long term (current) drug therapy: Secondary | ICD-10-CM | POA: Insufficient documentation

## 2022-09-29 DIAGNOSIS — Z7901 Long term (current) use of anticoagulants: Secondary | ICD-10-CM | POA: Diagnosis not present

## 2022-09-29 DIAGNOSIS — R0789 Other chest pain: Secondary | ICD-10-CM | POA: Insufficient documentation

## 2022-09-29 DIAGNOSIS — Z86711 Personal history of pulmonary embolism: Secondary | ICD-10-CM | POA: Diagnosis not present

## 2022-09-29 DIAGNOSIS — Z09 Encounter for follow-up examination after completed treatment for conditions other than malignant neoplasm: Secondary | ICD-10-CM | POA: Diagnosis not present

## 2022-09-29 DIAGNOSIS — Z6831 Body mass index (BMI) 31.0-31.9, adult: Secondary | ICD-10-CM | POA: Insufficient documentation

## 2022-09-29 DIAGNOSIS — K2270 Barrett's esophagus without dysplasia: Secondary | ICD-10-CM | POA: Diagnosis not present

## 2022-09-29 DIAGNOSIS — I10 Essential (primary) hypertension: Secondary | ICD-10-CM

## 2022-09-29 DIAGNOSIS — K449 Diaphragmatic hernia without obstruction or gangrene: Secondary | ICD-10-CM

## 2022-09-29 DIAGNOSIS — G4730 Sleep apnea, unspecified: Secondary | ICD-10-CM | POA: Diagnosis not present

## 2022-09-29 DIAGNOSIS — K3189 Other diseases of stomach and duodenum: Secondary | ICD-10-CM

## 2022-09-29 DIAGNOSIS — M1990 Unspecified osteoarthritis, unspecified site: Secondary | ICD-10-CM | POA: Insufficient documentation

## 2022-09-29 DIAGNOSIS — G4733 Obstructive sleep apnea (adult) (pediatric): Secondary | ICD-10-CM | POA: Diagnosis not present

## 2022-09-29 DIAGNOSIS — E669 Obesity, unspecified: Secondary | ICD-10-CM | POA: Diagnosis not present

## 2022-09-29 DIAGNOSIS — Z86718 Personal history of other venous thrombosis and embolism: Secondary | ICD-10-CM | POA: Diagnosis not present

## 2022-09-29 DIAGNOSIS — K219 Gastro-esophageal reflux disease without esophagitis: Secondary | ICD-10-CM | POA: Insufficient documentation

## 2022-09-29 HISTORY — PX: BIOPSY: SHX5522

## 2022-09-29 HISTORY — PX: ESOPHAGOGASTRODUODENOSCOPY (EGD) WITH PROPOFOL: SHX5813

## 2022-09-29 LAB — GLUCOSE, CAPILLARY: Glucose-Capillary: 103 mg/dL — ABNORMAL HIGH (ref 70–99)

## 2022-09-29 SURGERY — ESOPHAGOGASTRODUODENOSCOPY (EGD) WITH PROPOFOL
Anesthesia: Monitor Anesthesia Care

## 2022-09-29 MED ORDER — LACTATED RINGERS IV SOLN
INTRAVENOUS | Status: AC | PRN
  Administered 2022-09-29: 20 mL/h via INTRAVENOUS

## 2022-09-29 MED ORDER — SODIUM CHLORIDE 0.9 % IV SOLN: INTRAVENOUS | Status: DC

## 2022-09-29 MED ORDER — LIDOCAINE 2% (20 MG/ML) 5 ML SYRINGE
INTRAMUSCULAR | Status: DC | PRN
  Administered 2022-09-29: 80 mg via INTRAVENOUS

## 2022-09-29 MED ORDER — PROPOFOL 10 MG/ML IV BOLUS
INTRAVENOUS | Status: DC | PRN
  Administered 2022-09-29 (×2): 50 mg via INTRAVENOUS
  Administered 2022-09-29: 20 mg via INTRAVENOUS

## 2022-09-29 MED ORDER — PROPOFOL 10 MG/ML IV BOLUS
INTRAVENOUS | Status: AC
  Filled 2022-09-29: qty 20

## 2022-09-29 SURGICAL SUPPLY — 15 items

## 2022-09-29 NOTE — Op Note (Signed)
[facility] Patient Name: Eric Chung Procedure Date: 09/29/2022 MRN: [id number] Attending MD: Ronnette Juniper , MD, [id number] Date of Birth: 07/24/1935 CSN: [id number] Age: 88 Admit Type: Outpatient Procedure:                Upper GI endoscopy Indications:              Follow-up of gastro-esophageal reflux disease,                            Follow-up of Barrett's esophagus, Chest pain (non                            cardiac) Providers:                Ronnette Juniper, MD, Helane Rima, RN, Luan Moore, Technician, [facility], CRNA Referring MD:             Alain Honey, MD Medicines:                Monitored Anesthesia Care Complications:            No immediate complications. Estimated blood loss:                            Minimal. Estimated Blood Loss:     Estimated blood loss was minimal. Procedure:                Pre-Anesthesia Assessment:                           - Prior to the procedure, a History and Physical                            was performed, and patient medications and                            allergies were reviewed. The patient's tolerance of                            previous anesthesia was also reviewed. The risks                            and benefits of the procedure and the sedation                            options and risks were discussed with the patient.                            All questions were answered, and informed consent                            was obtained. Prior Anticoagulants: The patient has  taken Eliquis (apixaban), last dose was 5 days                            prior to procedure. ASA Grade Assessment: III - A                            patient with severe systemic disease. After                            reviewing the risks and benefits, the patient was                            deemed in satisfactory condition to undergo the                             procedure.                           After obtaining informed consent, the endoscope was                            passed under direct vision. Throughout the                            procedure, the patient's blood pressure, pulse, and                            oxygen saturations were monitored continuously. The                            [id number]-[id number] ([id number]) Olympus endoscope was introduced                            through the mouth, and advanced to the second part                            of duodenum. The upper GI endoscopy was                            accomplished without difficulty. The patient                            tolerated the procedure well. Scope In: Scope Out: Findings:      There were esophageal mucosal changes consistent with long-segment       Barrett's esophagus present in the middle third of the esophagus and in       the lower third of the esophagus. The maximum longitudinal extent of       these mucosal changes was 7 cm in length. Mucosa was biopsied with a       cold forceps for histology in a targeted manner at intervals of 0.5 cm       from 33 to 40 cm from the incisors. One specimen bottle was sent to       pathology.      A 3 cm  hiatal hernia was present.      Diffuse mildly erythematous mucosa without bleeding was found in the       gastric body and in the gastric antrum. Biopsies were taken with a cold       forceps for Helicobacter pylori testing.      The cardia and gastric fundus were normal on retroflexion.      The examined duodenum was normal. Impression:               - Esophageal mucosal changes consistent with                            long-segment Barrett's esophagus. Biopsied.                           - 3 cm hiatal hernia.                           - Erythematous mucosa in the gastric body and                            antrum. Biopsied.                           - Normal examined duodenum. Moderate Sedation:      Patient did not  receive moderate sedation for this procedure, but       instead received monitored anesthesia care. Recommendation:           - Patient has a contact number available for                            emergencies. The signs and symptoms of potential                            delayed complications were discussed with the                            patient. Return to normal activities tomorrow.                            Written discharge instructions were provided to the                            patient.                           - GERD prevention diet.                           - Continue present medications.                           - Await pathology results.                           - Resume Eliquis (apixaban) at prior dose tomorrow. Procedure Code(s):        --- Professional ---  06004, Esophagogastroduodenoscopy, flexible,                            transoral; with biopsy, single or multiple Diagnosis Code(s):        --- Professional ---                           K22.70, Barrett's esophagus without dysplasia                           K44.9, Diaphragmatic hernia without obstruction or                            gangrene                           K31.89, Other diseases of stomach and duodenum                           K21.9, Gastro-esophageal reflux disease without                            esophagitis                           R07.89, Other chest pain CPT copyright 2022 American [employer]. All rights reserved. The codes documented in this report are preliminary and upon coder review may  be revised to meet current compliance requirements. Ronnette Juniper, MD 09/29/2022 9:17:30 AM This report has been signed electronically. Number of Addenda: 0

## 2022-09-29 NOTE — Transfer of Care (Signed)
Immediate Anesthesia Transfer of Care Note  Patient: Eric Chung  Procedure(s) Performed: ESOPHAGOGASTRODUODENOSCOPY (EGD) WITH PROPOFOL BIOPSY  Patient Location: PACU  Anesthesia Type:MAC  Level of Consciousness: awake, alert , and oriented  Airway & Oxygen Therapy: Patient Spontanous Breathing and Patient connected to face mask oxygen  Post-op Assessment: Report given to RN and Post -op Vital signs reviewed and stable  Post vital signs: Reviewed and stable  Last Vitals:  Vitals Value Taken Time  BP    Temp    Pulse 73 09/29/22 0916  Resp 21 09/29/22 0916  SpO2 96 % 09/29/22 0916  Vitals shown include unvalidated device data.  Last Pain:  Vitals:   09/29/22 0803  TempSrc: Temporal  PainSc: 0-No pain         Complications: No notable events documented.

## 2022-09-29 NOTE — Anesthesia Postprocedure Evaluation (Signed)
Anesthesia Post Note  Patient: Eric Chung  Procedure(s) Performed: ESOPHAGOGASTRODUODENOSCOPY (EGD) WITH PROPOFOL BIOPSY     Patient location during evaluation: PACU Anesthesia Type: MAC Level of consciousness: awake and alert Pain management: pain level controlled Vital Signs Assessment: post-procedure vital signs reviewed and stable Respiratory status: spontaneous breathing, nonlabored ventilation and respiratory function stable Cardiovascular status: blood pressure returned to baseline and stable Postop Assessment: no apparent nausea or vomiting Anesthetic complications: no   No notable events documented.  Last Vitals:  Vitals:   09/29/22 0920 09/29/22 0930  BP: 117/71 131/75  Pulse: 73 71  Resp: (!) 21 15  Temp:    SpO2: 92% 93%    Last Pain:  Vitals:   09/29/22 0930  TempSrc:   PainSc: 0-No pain                 Lynda Rainwater

## 2022-09-29 NOTE — Anesthesia Preprocedure Evaluation (Signed)
Anesthesia Evaluation  Patient identified by MRN, date of birth, ID band Patient awake    Reviewed: Allergy & Precautions, H&P , NPO status , Patient's Chart, lab work & pertinent test results  Airway Mallampati: II  TM Distance: >3 FB Neck ROM: Full    Dental no notable dental hx.    Pulmonary sleep apnea , former smoker   Pulmonary exam normal breath sounds clear to auscultation       Cardiovascular hypertension, Pt. on medications negative cardio ROS Normal cardiovascular exam Rhythm:Regular Rate:Normal     Neuro/Psych  Headaches  negative psych ROS   GI/Hepatic Neg liver ROS,GERD  ,,  Endo/Other  negative endocrine ROS    Renal/GU Renal disease  negative genitourinary   Musculoskeletal  (+) Arthritis , Osteoarthritis,    Abdominal  (+) + obese  Peds negative pediatric ROS (+)  Hematology negative hematology ROS (+)   Anesthesia Other Findings   Reproductive/Obstetrics negative OB ROS                             Anesthesia Physical Anesthesia Plan  ASA: 3  Anesthesia Plan: MAC   Post-op Pain Management: Minimal or no pain anticipated   Induction: Intravenous  PONV Risk Score and Plan: 1 and Ondansetron and Treatment may vary due to age or medical condition  Airway Management Planned: Nasal Cannula  Additional Equipment:   Intra-op Plan:   Post-operative Plan: Extubation in OR  Informed Consent: I have reviewed the patients History and Physical, chart, labs and discussed the procedure including the risks, benefits and alternatives for the proposed anesthesia with the patient or authorized representative who has indicated his/her understanding and acceptance.     Dental advisory given  Plan Discussed with: CRNA  Anesthesia Plan Comments:        Anesthesia Quick Evaluation

## 2022-09-29 NOTE — Interval H&P Note (Signed)
History and Physical Interval Note: 87/male with non cardiac chest pain, history of Barrett's esophagus,chronic GERD on PPI,for EGD with propofol.  09/29/2022 8:58 AM  Crista Curb  has presented today for EGD with propofol, with the diagnosis of K22.7Barretts / K21.9GERD.  The various methods of treatment have been discussed with the patient and family. After consideration of risks, benefits and other options for treatment, the patient has consented to  Procedure(s): ESOPHAGOGASTRODUODENOSCOPY (EGD) WITH PROPOFOL (N/A) as a surgical intervention.  The patient's history has been reviewed, patient examined, no change in status, stable for surgery.  I have reviewed the patient's chart and labs.  Questions were answered to the patient's satisfaction.     Ronnette Juniper

## 2022-09-30 LAB — SURGICAL PATHOLOGY

## 2022-10-01 DIAGNOSIS — M6281 Muscle weakness (generalized): Secondary | ICD-10-CM | POA: Diagnosis not present

## 2022-10-01 DIAGNOSIS — R29898 Other symptoms and signs involving the musculoskeletal system: Secondary | ICD-10-CM | POA: Diagnosis not present

## 2022-10-02 ENCOUNTER — Encounter (HOSPITAL_COMMUNITY): Payer: Self-pay | Admitting: Gastroenterology

## 2022-10-27 DIAGNOSIS — K219 Gastro-esophageal reflux disease without esophagitis: Secondary | ICD-10-CM | POA: Diagnosis not present

## 2022-10-27 DIAGNOSIS — R195 Other fecal abnormalities: Secondary | ICD-10-CM | POA: Diagnosis not present

## 2022-10-30 ENCOUNTER — Ambulatory Visit: Payer: Medicare PPO | Admitting: Internal Medicine

## 2022-11-25 NOTE — Progress Notes (Signed)
Cardiology Office Note:    Date:  11/27/2022   ID:  Eric Chung, DOB 01-04-1935, MRN [id number]  PCP:  Frederica Kuster, MD   [facility] Providers Cardiologist:  Orbie Pyo, MD     Referring MD: Mast, Man X, NP   Chief Complaint: CHF  History of Present Illness:    Eric Chung is a pleasant 88 y.o. 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   Referred to cardiology by PCP and seen by Dr. Lynnette Caffey 07/07/2021 for evaluation of skipped heartbeats with recent EKG that demonstrated sinus rhythm with right bundle branch block, left anterior fascicular block, and PVCs.  At that time he reported an extensive cardiac evaluation years previous prior to hernia surgery.  Results of that exam reportedly benign although results not available for review. TTE 07/30/21 revealed LVEF 30 to 35%, difficult to assess wall motion due to very frequent PVCs with septal lateral dyssynchrony.  Overall appears to have global HK, mildly dilated LV cavity, mild LVH, grade 1 diastolic dysfunction, normal RV, mildly dilated LA, mild MR, mild calcification of the aortic valve, mild AI, no evidence of aortic stenosis.  Mild dilatation of the aortic root measuring 40 mm and mild dilatation of the ascending aorta measuring 37 mm. No prior echo for comparison.He was advised to d/c Verapamil and start Toprol XL 25 mg daily.   Return office visit 10/06/21 at which time per record review, he did not wish to undergo invasive assessment and therefore no plan for cardiac catheterization. Toprol XL was reduced to 12.5 mg daily and he was advised to start losartan 25 mg and Jardiance 10 mg daily. TTE 01/05/22 revealed LVEF 35 to 40%, moderately reduced LV function, global HK, indeterminate diastolic filling pressure, no significant change from previous study.   Last cardiology clinic visit  was 05/04/2022 with Dr. Lynnette Caffey at which time he was in stable condition. Plan to repeat in 3 months and continue current medical therapy. Would consider addition of spironolactone if EF remained reduced.  Admission 12/3-12/5/23 for left-sided shoulder pain that occurred while watching TV and moved across his chest and back as well as diaphoresis, nausea, and lightheadedness.  He was given SL NTG with improvement of pain but he continued to have sharp pain with deep inspiration to the point it was difficult for him to take another deep breath.  Was given morphine with pain relief but pain would return.  In ED serum creatinine 1.25, high-sensitivity troponin 30 and 29, other labs unremarkable.  Chest CT showed no evidence of CHF but did show findings consistent with acute bronchitis and small airway disease.  He was noted to have trace three-vessel coronary calcifications.  EKG was nonischemic with NSR with chronic right bundle branch block and left anterior fascicular block. Cardiology was asked to consult. Hs troponin 30 ? 29 ? 67 ? 62.  No pericardial effusion on CT but clinically symptoms consistent with pericarditis. ESR was normal.   Coronary CTA 07/28/2022 with calcium score of 41, 25-49% pLAD/OM1 and dRCA stenosis and aortic atherosclerosis. Noncardiac portion showed possible early ILD versus aspiration with small hiatal hernia and possible esophageal dysmotility. Suspicion that CP related to GI etiology with GERD, esophageal dysmotility and possible pleuritic CP from possible aspiration. ESR normal and no evidence of pericardial effusion on echo or CTA so colchicine was stopped.  Losartan was changed to [facility] 24-26  twice daily.  No additional medication changes were made due to soft BP.  Seen in cardiology clinic by me on 08/28/22 for post-hospital follow-up. Reports he is feeling well today. Did not feel well after hospitalization. Stopped many of his cardiac medications due to diarrhea and feeling  like he may pass out. Discussed these concerns with PCP and together they decided he should stop some of his medications to see if symptoms improve. PCP encouraged him to take Jardiance which he did this morning and thus far, no ill effects. SBP 120-128 at home. Is concerned about his "gut health" 2/2 his father died of esophageal cancer and grandmother died of colon cancer. Has had relief in chest pain that sent him to hospital 12/3. Reports he was given a medication soon after arrival that relieved those symptoms. Denied palpitations, dyspnea, orthopnea, PND, syncope. Has difficulty with coughing at night. Feels that it is 2/2 to phlegm production and reflux. No bleeding concerns. He was advised to continue low sodium diet, daily weights to monitor fluid status, and to continue Jardiance 10 mg daily. Was advised to resume metoprolol if HR consistently > 100 bpm. LDL measured at that visit was 122 and he was advised to start rosuvastatin 5 mg daily. Liver and kidney function stable.  Today, he is here for 3 month follow-up. Reports he is feeling well. Unfortunately he continues to have diarrhea, but it has improved some. No clear source. No further episodes of "nearly blacking out."  He feels these may have been and 2/2 metoprolol. Overall feeling well. No dyspnea, orthopnea, edema, PND, early satiety, chest pain, palpitations, presyncope or syncope. Says he is really committed to start exercising. Is going to start with walking and enrolling in a strengthening class.    Past Medical History:  Diagnosis Date   Apnea, sleep    Barrett's esophagus    Gout    Hypertension    Obesity    Pulmonary emboli     Past Surgical History:  Procedure Laterality Date   BIOPSY  09/29/2022   Procedure: BIOPSY;  Surgeon: Kerin Salen, MD;  Location: [facility];  Service: Gastroenterology;;   CHOLECYSTECTOMY  08/29/2017   ESOPHAGOGASTRODUODENOSCOPY (EGD) WITH PROPOFOL N/A 09/29/2022   Procedure:  ESOPHAGOGASTRODUODENOSCOPY (EGD) WITH PROPOFOL;  Surgeon: Kerin Salen, MD;  Location: [facility];  Service: Gastroenterology;  Laterality: N/A;   EYE SURGERY Bilateral 2011-2012   catracts removed   HERNIA REPAIR  2005   T. Jacinto Reap MD   TONSILECTOMY/ADENOIDECTOMY WITH MYRINGOTOMY  1944    Current Medications: Current Meds  Medication Sig   allopurinol (ZYLOPRIM) 100 MG tablet TAKE ONE TABLET BY MOUTH TWICE DAILY   apixaban (ELIQUIS) 2.5 MG TABS tablet Take 2.5 mg by mouth 2 (two) times a day.    aspirin EC 81 MG tablet Take 1 tablet (81 mg total) by mouth daily. Swallow whole.   calcium carbonate (TUMS - DOSED IN MG ELEMENTAL CALCIUM) 500 MG chewable tablet Chew 1 tablet by mouth daily as needed for indigestion.   empagliflozin (JARDIANCE) 10 MG TABS tablet Take 10 mg by mouth daily.   fluticasone (FLONASE) 50 MCG/ACT nasal spray Place 1 spray into both nostrils every evening.   hydrocortisone cream 1 % Apply 1 Application topically 2 (two) times daily as needed for itching.   ibuprofen (ADVIL) 200 MG tablet Take 200 mg by mouth every 6 (six) hours as needed for moderate pain.   latanoprost (XALATAN) 0.005 % ophthalmic solution Place 1 drop into both eyes  at bedtime.   loperamide (IMODIUM) 2 MG capsule Take 2 mg by mouth as needed for diarrhea or loose stools.   nitroGLYCERIN (NITROSTAT) 0.4 MG SL tablet Place 1 tablet (0.4 mg total) under the tongue every 5 (five) minutes as needed for chest pain.   omeprazole (PRILOSEC) 20 MG capsule Take 20 mg by mouth daily.   Polyethyl Glycol-Propyl Glycol (SYSTANE OP) Place 1 drop into both eyes 2 (two) times daily.   Probiotic Product (ALIGN) 4 MG CAPS Take 4 mg by mouth daily.   sacubitril-valsartan (ENTRESTO) 24-26 MG Take 1 tablet by mouth 2 (two) times daily.     Allergies:   Penicillins   Social History   Socioeconomic History   Marital status: Married    Spouse name: Kathie Rhodes   Number of children: 1   Years of education: Not on file    Highest education level: Not on file  Occupational History   Not on file  Tobacco Use   Smoking status: Former    Packs/day: 1.00    Years: 15.00    Additional pack years: 0.00    Total pack years: 15.00    Types: Cigarettes    Quit date: 08/24/1984    Years since quitting: 38.2   Smokeless tobacco: Never  Vaping Use   Vaping Use: Never used  Substance and Sexual Activity   Alcohol use: Yes    Alcohol/week: 3.0 - 4.0 standard drinks of alcohol    Types: 3 - 4 Standard drinks or equivalent per week    Comment: 2-3 beers a month   Drug use: Not Currently   Sexual activity: Not Currently  Other Topics Concern   Not on file  Social History Narrative   Social History     Marital status: Married ,1986            Spouse name: Kathie Rhodes                    Years of education:                 Number of children: 1                Occupational History: Runner, broadcasting/film/video     None on file      Social History Main Topics     Smoking status:                       Smokeless tobacco: Not on file                        Alcohol use: 3-4 drinks per week     Drug use: Unknown     Sexual activity: Not on file            Other Topics            Concern     None on file      Social History Narrative     None on file            Social Determinants of Health   Financial Resource Strain: Low Risk  (07/11/2018)   Overall Financial Resource Strain (CARDIA)    Difficulty of Paying Living Expenses: Not hard at all  Food Insecurity: No Food Insecurity (07/27/2022)   Hunger Vital Sign    Worried About Running Out of Food in the Last Year: Never true    Ran Out of Food in  the Last Year: Never true  Transportation Needs: No Transportation Needs (07/27/2022)   PRAPARE - Administrator, Civil Service (Medical): No    Lack of Transportation (Non-Medical): No  Physical Activity: Inactive (07/11/2018)   Exercise Vital Sign    Days of Exercise per Week: 0 days    Minutes of Exercise per Session: 0 min   Stress: No Stress Concern Present (07/11/2018)   [employer] of Occupational Health - Occupational Stress Questionnaire    Feeling of Stress : Only a little  Social Connections: Socially Integrated (07/11/2018)   Social Connection and Isolation Panel [NHANES]    Frequency of Communication with Friends and Family: More than three times a week    Frequency of Social Gatherings with Friends and Family: More than three times a week    Attends Religious Services: More than 4 times per year    Active Member of [employer] or Organizations: Yes    Attends Engineer, structural: More than 4 times per year    Marital Status: Married     Family History: The patient's family history includes Birth defects in his maternal grandmother; Cancer (age of onset: 57) in his father; Crohn's disease in his sister; Diabetes in his paternal grandmother; Osteoporosis in his mother.  ROS:   Please see the history of present illness.  All other systems reviewed and are negative.  Labs/Other Studies Reviewed:    The following studies were reviewed today:  CCTA 07/28/22 1. Coronary calcium score of 41.1. This was 8 percentile for age-, sex, and race-matched controls.   2. Normal coronary origin with right dominance.   3. Mild (25-49) stenoses in the proximal LAD, OM1 and distal RCA.   4. Aortic atherosclerosis.   Echo 07/27/22 1. Exremelly poor acoustic windows llimit study, even with Definity use.  No significant change in reported LVEF from previous echo. Left  ventricular ejection fraction, by estimation, is 35 to 40%. The left  ventricle has moderately decreased function.  The left ventricular internal cavity size was mildly dilated.   2. Right ventricular systolic function is normal. The right ventricular  size is normal.   3. The mitral valve is normal in structure. No evidence of mitral valve  regurgitation.   4. The aortic valve is normal in structure. Aortic valve regurgitation is   mild.  Echo 07/30/21 1. Very frequent PVCs during the study.   2. Left ventricular ejection fraction, by estimation, is 30 to 35%. The  left ventricle has moderately decreased function. Difficult to assess wall  motion due to very frequent PVCs with septal-lateral dyssynchrony.  Overall, appears to have global  hypokinesis. The left ventricular internal cavity size was mildly dilated.  There is mild concentric left ventricular hypertrophy. Left ventricular  diastolic parameters are consistent with Grade I diastolic dysfunction  (impaired relaxation).   3. Right ventricular systolic function is normal. The right ventricular  size is normal.   4. Left atrial size was mildly dilated.   5. The mitral valve is grossly normal. Mild mitral valve regurgitation.   6. The aortic valve is tricuspid. There is mild calcification of the  aortic valve. There is mild thickening of the aortic valve. Aortic valve  regurgitation is mild. Aortic valve sclerosis/calcification is present,  without any evidence of aortic  stenosis.   7. Aortic dilatation noted. There is mild dilatation of the aortic root,  measuring 40 mm. There is mild dilatation of the ascending aorta,  measuring 37 mm.  Comparison(s): No prior Echocardiogram.  Recent Labs: 07/28/2022: Hemoglobin 13.5; Magnesium 2.0; Platelets 214; TSH 1.100 08/28/2022: ALT 11; BUN 15; Creatinine, Ser 1.17; Potassium 3.9; Sodium 140  Recent Lipid Panel    Component Value Date/Time   CHOL 190 08/28/2022 1115   TRIG 110 08/28/2022 1115   HDL 48 08/28/2022 1115   CHOLHDL 4.0 08/28/2022 1115   CHOLHDL 5.2 (H) 10/05/2019 0700   LDLCALC 122 (H) 08/28/2022 1115   LDLCALC 152 (H) 10/05/2019 0700     Risk Assessment/Calculations:       Physical Exam:    VS:  BP 130/78   Pulse 78   Ht 5\' 11"  (1.803 m)   Wt 227 lb (103 kg)   SpO2 96%   BMI 31.66 kg/m     Wt Readings from Last 3 Encounters:  11/27/22 227 lb (103 kg)  09/29/22 228 lb (103.4  kg)  09/10/22 228 lb (103.4 kg)     GEN: Obese, well developed in no acute distress HEENT: Normal NECK: No JVD; No carotid bruits CARDIAC: RRR, no murmurs, rubs, gallops RESPIRATORY:  Clear to auscultation without rales, wheezing or rhonchi  ABDOMEN: Soft, non-tender, non-distended MUSCULOSKELETAL:  No edema; No deformity. 2+ pedal pulses, equal bilaterally SKIN: Warm and dry NEUROLOGIC:  Alert and oriented x 3 PSYCHIATRIC:  Normal affect   EKG:  EKG is not ordered today   Diagnoses:    1. Dilated cardiomyopathy   2. Aortic atherosclerosis   3. Coronary artery disease involving native coronary artery of native heart without angina pectoris   4. Primary hypertension   5. VTE (venous thromboembolism)   6. Chronic anticoagulation   7. Hyperlipidemia LDL goal <70   8. Chronic HFrEF (heart failure with reduced ejection fraction)     Assessment and Plan:     Chronic HFrEF/Dilated CM: LVEF 35 to 40%, mildly dilated LV, normal RV, mild AI on echo 07/27/2022.  He denies dyspnea, edema, weight gain, orthopnea, early satiety, PND. No obvious signs of volume overload, although body habitus makes it difficult to assess. Tolerating Jardiance without  concerning side effect.  Lengthy discussion about adding goal-directed medical therapy.  He is concerned about medication side effects. He would like to restart Entresto 24-26 mg twice daily. We will check BMP in 2 weeks. Encouraged him to monitor BP and notify us if he does not tolerate Entresto.   CAD without angina: Coronary CTA 07/28/22 revealed mild non-obstructive CAD. He denies chest pain, dyspnea, or other symptoms concerning for angina.  No indication for further ischemic evaluation at this time.  Secondary prevention emphasized including weight loss, heart healthy, mostly plant-based diet, regular physical activity, and good cholesterol and blood pressure control. LDL not at goal as discussed below.   Hypertension: BP is initially  elevated, improved on my recheck. We are adding Entresto 24-26 mg twice daily as noted above. I have asked him to monitor BP on a consistent basis and report if consistently > 140/80 or if he has low readings on Entresto.   Hyperlipidemia LDL goal < 70/aortic atherosclerosis: LDL 122 on 08/28/2022. Did not feel high dose statin was necessary since there is no coronary occlusion. Advised him to try rosuvastatin 5 mg daily which he politely refused at the time. Will continue discussion at next office visit.   Chronic DVT/PE: On Eliquis 2.5 mg twice daily, no bleeding concerns. Management per PCP.     Disposition: 3 months with me  Medication Adjustments/Labs and Tests Ordered: Current medicines are reviewed  at length with the patient today.  Concerns regarding medicines are outlined above.  Orders Placed This Encounter  Procedures   Basic Metabolic Panel (BMET)   Meds ordered this encounter  Medications   sacubitril-valsartan (ENTRESTO) 24-26 MG    Sig: Take 1 tablet by mouth 2 (two) times daily.    Dispense:  60 tablet    Refill:  6    Patient Instructions  Medication Instructions:   RESTART Entresto one (1) tablet by mouth (24-26 mg ) twice daily.    *If you need a refill on your cardiac medications before your next appointment, please call your pharmacy*   Lab Work:  Your physician recommends that you return for lab work in: two weeks.  Paperwork given to pt today.    If you have labs (blood work) drawn today and your tests are completely normal, you will receive your results only by: MyChart Message (if you have MyChart) OR A paper copy in the mail If you have any lab test that is abnormal or we need to change your treatment, we will call you to review the results.   Testing/Procedures:  None ordered.   Follow-Up: At [facility], you and your health needs are our priority.  As part of our continuing mission to provide you with exceptional heart care, we have  created designated Provider Care Teams.  These Care Teams include your primary Cardiologist (physician) and Advanced Practice Providers (APPs -  Physician Assistants and Nurse Practitioners) who all work together to provide you with the care you need, when you need it.  We recommend signing up for the patient portal called "MyChart".  Sign up information is provided on this After Visit Summary.  MyChart is used to connect with patients for Virtual Visits (Telemedicine).  Patients are able to view lab/test results, encounter notes, upcoming appointments, etc.  Non-urgent messages can be sent to your provider as well.   To learn more about what you can do with MyChart, go to [email or web address].    Your next appointment:   3 month(s)  Provider:   Eligha Bridegroom, NP         Other Instructions  HOW TO TAKE YOUR BLOOD PRESSURE  Rest 5 minutes before taking your blood pressure. Don't  smoke or drink caffeinated beverages for at least 30 minutes before. Take your blood pressure before (not after) you eat. Sit comfortably with your back supported and both feet on the floor ( don't cross your legs). Elevate your arm to heart level on a table or a desk. Use the proper sized cuff.  It should fit smoothly and snugly around your bare upper arm.  There should be  Enough room to slip a fingertip under the cuff.  The bottom edge of the cuff should be 1 inch above the crease Of the elbow. Please monitor your blood pressure once daily 2 hours after your am medication. If you blood pressure Consistently remains above 140 (systolic) top number or over 80 ( diastolic) bottom number X 3 days  Consecutively.  Please call our office at [phone] or send Mychart message.     ----Avoid cold medicines with D or DM at the end of them----       Signed, Levi Aland, NP  11/27/2022 1:19 PM    [facility]

## 2022-11-27 ENCOUNTER — Other Ambulatory Visit: Payer: Self-pay | Admitting: *Deleted

## 2022-11-27 ENCOUNTER — Encounter: Payer: Self-pay | Admitting: Nurse Practitioner

## 2022-11-27 ENCOUNTER — Ambulatory Visit: Payer: Medicare PPO | Attending: Nurse Practitioner | Admitting: Nurse Practitioner

## 2022-11-27 VITALS — BP 130/78 | HR 78 | Ht 71.0 in | Wt 227.0 lb

## 2022-11-27 DIAGNOSIS — I420 Dilated cardiomyopathy: Secondary | ICD-10-CM | POA: Diagnosis not present

## 2022-11-27 DIAGNOSIS — I700 Atherosclerosis of aorta: Secondary | ICD-10-CM

## 2022-11-27 DIAGNOSIS — I8290 Acute embolism and thrombosis of unspecified vein: Secondary | ICD-10-CM | POA: Diagnosis not present

## 2022-11-27 DIAGNOSIS — I2510 Atherosclerotic heart disease of native coronary artery without angina pectoris: Secondary | ICD-10-CM | POA: Diagnosis not present

## 2022-11-27 DIAGNOSIS — I10 Essential (primary) hypertension: Secondary | ICD-10-CM

## 2022-11-27 DIAGNOSIS — I5022 Chronic systolic (congestive) heart failure: Secondary | ICD-10-CM | POA: Diagnosis not present

## 2022-11-27 DIAGNOSIS — E785 Hyperlipidemia, unspecified: Secondary | ICD-10-CM

## 2022-11-27 DIAGNOSIS — Z7901 Long term (current) use of anticoagulants: Secondary | ICD-10-CM | POA: Diagnosis not present

## 2022-11-27 MED ORDER — ENTRESTO 24-26 MG PO TABS: 1.0000 | ORAL_TABLET | Freq: Two times a day (BID) | ORAL | 6 refills | Status: DC

## 2022-11-27 NOTE — Patient Instructions (Addendum)
Medication Instructions:   RESTART Entresto one (1) tablet by mouth (24-26 mg ) twice daily.    *If you need a refill on your cardiac medications before your next appointment, please call your pharmacy*   Lab Work:  Your physician recommends that you return for lab work in: two weeks.  Paperwork given to pt today.    If you have labs (blood work) drawn today and your tests are completely normal, you will receive your results only by: MyChart Message (if you have MyChart) OR A paper copy in the mail If you have any lab test that is abnormal or we need to change your treatment, we will call you to review the results.   Testing/Procedures:  None ordered.   Follow-Up: At [facility], you and your health needs are our priority.  As part of our continuing mission to provide you with exceptional heart care, we have created designated Provider Care Teams.  These Care Teams include your primary Cardiologist (physician) and Advanced Practice Providers (APPs -  Physician Assistants and Nurse Practitioners) who all work together to provide you with the care you need, when you need it.  We recommend signing up for the patient portal called "MyChart".  Sign up information is provided on this After Visit Summary.  MyChart is used to connect with patients for Virtual Visits (Telemedicine).  Patients are able to view lab/test results, encounter notes, upcoming appointments, etc.  Non-urgent messages can be sent to your provider as well.   To learn more about what you can do with MyChart, go to [email or web address].    Your next appointment:   3 month(s)  Provider:   Eligha Bridegroom, NP         Other Instructions  HOW TO TAKE YOUR BLOOD PRESSURE  Rest 5 minutes before taking your blood pressure. Don't  smoke or drink caffeinated beverages for at least 30 minutes before. Take your blood pressure before (not after) you eat. Sit comfortably with your back supported and both feet  on the floor ( don't cross your legs). Elevate your arm to heart level on a table or a desk. Use the proper sized cuff.  It should fit smoothly and snugly around your bare upper arm.  There should be  Enough room to slip a fingertip under the cuff.  The bottom edge of the cuff should be 1 inch above the crease Of the elbow. Please monitor your blood pressure once daily 2 hours after your am medication. If you blood pressure Consistently remains above 140 (systolic) top number or over 80 ( diastolic) bottom number X 3 days  Consecutively.  Please call our office at [phone] or send Mychart message.     ----Avoid cold medicines with D or DM at the end of them----

## 2022-11-30 DIAGNOSIS — H04123 Dry eye syndrome of bilateral lacrimal glands: Secondary | ICD-10-CM | POA: Diagnosis not present

## 2022-11-30 DIAGNOSIS — H401131 Primary open-angle glaucoma, bilateral, mild stage: Secondary | ICD-10-CM | POA: Diagnosis not present

## 2022-12-29 DIAGNOSIS — M2041 Other hammer toe(s) (acquired), right foot: Secondary | ICD-10-CM | POA: Diagnosis not present

## 2022-12-29 DIAGNOSIS — L602 Onychogryphosis: Secondary | ICD-10-CM | POA: Diagnosis not present

## 2022-12-29 DIAGNOSIS — M2042 Other hammer toe(s) (acquired), left foot: Secondary | ICD-10-CM | POA: Diagnosis not present

## 2022-12-31 DIAGNOSIS — I10 Essential (primary) hypertension: Secondary | ICD-10-CM | POA: Diagnosis not present

## 2022-12-31 DIAGNOSIS — I420 Dilated cardiomyopathy: Secondary | ICD-10-CM | POA: Diagnosis not present

## 2022-12-31 DIAGNOSIS — I2510 Atherosclerotic heart disease of native coronary artery without angina pectoris: Secondary | ICD-10-CM | POA: Diagnosis not present

## 2022-12-31 DIAGNOSIS — I700 Atherosclerosis of aorta: Secondary | ICD-10-CM | POA: Diagnosis not present

## 2023-01-01 LAB — BASIC METABOLIC PANEL
BUN/Creatinine Ratio: 14 (ref 10–24)
BUN: 17 mg/dL (ref 8–27)
CO2: 19 mmol/L — ABNORMAL LOW (ref 20–29)
Calcium: 9.1 mg/dL (ref 8.6–10.2)
Chloride: 103 mmol/L (ref 96–106)
Creatinine, Ser: 1.18 mg/dL (ref 0.76–1.27)
Glucose: 102 mg/dL — ABNORMAL HIGH (ref 70–99)
Potassium: 4.5 mmol/L (ref 3.5–5.2)
Sodium: 137 mmol/L (ref 134–144)
eGFR: 60 mL/min/{1.73_m2} (ref >59)

## 2023-01-22 ENCOUNTER — Other Ambulatory Visit: Payer: Self-pay

## 2023-01-22 MED ORDER — ENTRESTO 24-26 MG PO TABS: 1.0000 | ORAL_TABLET | Freq: Two times a day (BID) | ORAL | 3 refills | Status: DC

## 2023-01-24 ENCOUNTER — Other Ambulatory Visit: Payer: Self-pay | Admitting: Nurse Practitioner

## 2023-02-11 ENCOUNTER — Other Ambulatory Visit: Payer: Self-pay | Admitting: Internal Medicine

## 2023-03-07 NOTE — Progress Notes (Deleted)
Cardiology Office Note:    Date:  03/07/2023   ID:  Eric Chung, DOB Oct 16, 1934, MRN [id number]  PCP:  Frederica Kuster, MD   [facility] Providers Cardiologist:  Orbie Pyo, MD     Referring MD: Frederica Kuster, MD   Chief Complaint: CHF  History of Present Illness:    Eric Chung is a pleasant 88 y.o. 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   Referred to cardiology by PCP and seen by Dr. Lynnette Caffey 07/07/2021 for evaluation of skipped heartbeats with recent EKG that demonstrated sinus rhythm with right bundle branch block, left anterior fascicular block, and PVCs.  At that time he reported an extensive cardiac evaluation years previous prior to hernia surgery.  Results of that exam reportedly benign although results not available for review. TTE 07/30/21 revealed LVEF 30 to 35%, difficult to assess wall motion due to very frequent PVCs with septal lateral dyssynchrony.  Overall appears to have global HK, mildly dilated LV cavity, mild LVH, grade 1 diastolic dysfunction, normal RV, mildly dilated LA, mild MR, mild calcification of the aortic valve, mild AI, no evidence of aortic stenosis.  Mild dilatation of the aortic root measuring 40 mm and mild dilatation of the ascending aorta measuring 37 mm. No prior echo for comparison.He was advised to d/c Verapamil and start Toprol XL 25 mg daily.   Return office visit 10/06/21 at which time per record review, he did not wish to undergo invasive assessment and therefore no plan for cardiac catheterization. Toprol XL was reduced to 12.5 mg daily and he was advised to start losartan 25 mg and Jardiance 10 mg daily. TTE 01/05/22 revealed LVEF 35 to 40%, moderately reduced LV function, global HK, indeterminate diastolic filling pressure, no significant change from previous study.   Last cardiology clinic  visit was 05/04/2022 with Dr. Lynnette Caffey at which time he was in stable condition. Plan to repeat in 3 months and continue current medical therapy. Would consider addition of spironolactone if EF remained reduced.  Admission 12/3-12/5/23 for left-sided shoulder pain that occurred while watching TV and moved across his chest and back as well as diaphoresis, nausea, and lightheadedness.  He was given SL NTG with improvement of pain but he continued to have sharp pain with deep inspiration to the point it was difficult for him to take another deep breath.  Was given morphine with pain relief but pain would return.  In ED serum creatinine 1.25, high-sensitivity troponin 30 and 29, other labs unremarkable.  Chest CT showed no evidence of CHF but did show findings consistent with acute bronchitis and small airway disease.  He was noted to have trace three-vessel coronary calcifications.  EKG was nonischemic with NSR with chronic right bundle branch block and left anterior fascicular block. Cardiology was asked to consult. Hs troponin 30 ? 29 ? 67 ? 62.  No pericardial effusion on CT but clinically symptoms consistent with pericarditis. ESR was normal.   Coronary CTA 07/28/2022 with calcium score of 41, 25-49% pLAD/OM1 and dRCA stenosis and aortic atherosclerosis. Noncardiac portion showed possible early ILD versus aspiration with small hiatal hernia and possible esophageal dysmotility. Suspicion that CP related to GI etiology with GERD, esophageal dysmotility and possible pleuritic CP from possible aspiration. ESR normal and no evidence of pericardial effusion on echo or CTA so colchicine was stopped.  Losartan was changed to [facility] 24-26  twice daily.  No additional medication changes were made due to soft BP.  Seen in cardiology clinic by me on 08/28/22 for post-hospital follow-up. Reports he is feeling well today. Did not feel well after hospitalization. Stopped many of his cardiac medications due to diarrhea and  feeling like he may pass out. Discussed these concerns with PCP and together they decided he should stop some of his medications to see if symptoms improve. PCP encouraged him to take Jardiance which he did this morning and thus far, no ill effects. SBP 120-128 at home. Is concerned about his "gut health" 2/2 his father died of esophageal cancer and grandmother died of colon cancer. Has had relief in chest pain that sent him to hospital 12/3. Reports he was given a medication soon after arrival that relieved those symptoms. Denied palpitations, dyspnea, orthopnea, PND, syncope. Has difficulty with coughing at night. Feels that it is 2/2 to phlegm production and reflux. No bleeding concerns. He was advised to continue low sodium diet, daily weights to monitor fluid status, and to continue Jardiance 10 mg daily. Was advised to resume metoprolol if HR consistently > 100 bpm. LDL measured at that visit was 122 and he was advised to start rosuvastatin 5 mg daily. Liver and kidney function stable.  Seen by me on 11/27/22 for 3 month follow-up. Reported feeling well. Unfortunately he continued to have diarrhea, but it had improved some. No clear source. No further episodes of "nearly blacking out."  Felt presyncope may have been 2/2 metoprolol. Overall feeling well. No dyspnea, orthopnea, edema, PND, early satiety, chest pain, palpitations, presyncope or syncope. Reports commitment to start exercising. Planning to start with walking and enroll in a strengthening class. He did not appear volume overloaded. We added Entresto 24-26 mg twice daily and he had stable kidney function and electrolytes on BMP 12/31/22. Does not feel high dose statin necessary since there is no coronary occlusion. Advised him to try rosuvastatin 5 mg daily which he politely refused at the time.   Today, he is here    Past Medical History:  Diagnosis Date   Apnea, sleep    Barrett's esophagus    Gout    Hypertension    Obesity    Pulmonary  emboli [facility])     Past Surgical History:  Procedure Laterality Date   BIOPSY  09/29/2022   Procedure: BIOPSY;  Surgeon: Kerin Salen, MD;  Location: [facility];  Service: Gastroenterology;;   CHOLECYSTECTOMY  08/29/2017   ESOPHAGOGASTRODUODENOSCOPY (EGD) WITH PROPOFOL N/A 09/29/2022   Procedure: ESOPHAGOGASTRODUODENOSCOPY (EGD) WITH PROPOFOL;  Surgeon: Kerin Salen, MD;  Location: [facility];  Service: Gastroenterology;  Laterality: N/A;   EYE SURGERY Bilateral 2011-2012   catracts removed   HERNIA REPAIR  2005   T. Jacinto Reap MD   TONSILECTOMY/ADENOIDECTOMY WITH MYRINGOTOMY  1944    Current Medications: No outpatient medications have been marked as taking for the 03/08/23 encounter (Appointment) with Lissa Hoard, Zachary George, NP.     Allergies:   Penicillins   Social History   Socioeconomic History   Marital status: Married    Spouse name: Kathie Rhodes   Number of children: 1   Years of education: Not on file   Highest education level: Not on file  Occupational History   Not on file  Tobacco Use   Smoking status: Former    Current packs/day: 0.00    Average packs/day: 1 pack/day for 15.0 years (15.0 ttl pk-yrs)    Types: Cigarettes    Start date:  08/24/1969    Quit date: 08/24/1984    Years since quitting: 38.5   Smokeless tobacco: Never  Vaping Use   Vaping status: Never Used  Substance and Sexual Activity   Alcohol use: Yes    Alcohol/week: 3.0 - 4.0 standard drinks of alcohol    Types: 3 - 4 Standard drinks or equivalent per week    Comment: 2-3 beers a month   Drug use: Not Currently   Sexual activity: Not Currently  Other Topics Concern   Not on file  Social History Narrative   Social History     Marital status: Married ,1986            Spouse name: Kathie Rhodes                    Years of education:                 Number of children: 1                Occupational History: Runner, broadcasting/film/video     None on file      Social History Main Topics     Smoking status:                        Smokeless tobacco: Not on file                        Alcohol use: 3-4 drinks per week     Drug use: Unknown     Sexual activity: Not on file            Other Topics            Concern     None on file      Social History Narrative     None on file            Social Determinants of Health   Financial Resource Strain: Low Risk  (07/11/2018)   Overall Financial Resource Strain (CARDIA)    Difficulty of Paying Living Expenses: Not hard at all  Food Insecurity: No Food Insecurity (07/27/2022)   Hunger Vital Sign    Worried About Running Out of Food in the Last Year: Never true    Ran Out of Food in the Last Year: Never true  Transportation Needs: No Transportation Needs (07/27/2022)   PRAPARE - Administrator, Civil Service (Medical): No    Lack of Transportation (Non-Medical): No  Physical Activity: Inactive (07/11/2018)   Exercise Vital Sign    Days of Exercise per Week: 0 days    Minutes of Exercise per Session: 0 min  Stress: No Stress Concern Present (07/11/2018)   [employer] of Occupational Health - Occupational Stress Questionnaire    Feeling of Stress : Only a little  Social Connections: Unknown (12/24/2021)   Received from [facility]   Social Network    Social Network: Not on file     Family History: The patient's family history includes Birth defects in his maternal grandmother; Cancer (age of onset: 28) in his father; Crohn's disease in his sister; Diabetes in his paternal grandmother; Osteoporosis in his mother.  ROS:   Please see the history of present illness.  All other systems reviewed and are negative.  Labs/Other Studies Reviewed:    The following studies were reviewed today:  CCTA 07/28/22 1. Coronary calcium score of 41.1. This was 8 percentile for  age-, sex, and race-matched controls.   2. Normal coronary origin with right dominance.   3. Mild (25-49) stenoses in the proximal LAD, OM1 and distal RCA.   4. Aortic  atherosclerosis.   Echo 07/27/22 1. Exremelly poor acoustic windows llimit study, even with Definity use.  No significant change in reported LVEF from previous echo. Left  ventricular ejection fraction, by estimation, is 35 to 40%. The left  ventricle has moderately decreased function.  The left ventricular internal cavity size was mildly dilated.   2. Right ventricular systolic function is normal. The right ventricular  size is normal.   3. The mitral valve is normal in structure. No evidence of mitral valve  regurgitation.   4. The aortic valve is normal in structure. Aortic valve regurgitation is  mild.  Echo 07/30/21 1. Very frequent PVCs during the study.   2. Left ventricular ejection fraction, by estimation, is 30 to 35%. The  left ventricle has moderately decreased function. Difficult to assess wall  motion due to very frequent PVCs with septal-lateral dyssynchrony.  Overall, appears to have global  hypokinesis. The left ventricular internal cavity size was mildly dilated.  There is mild concentric left ventricular hypertrophy. Left ventricular  diastolic parameters are consistent with Grade I diastolic dysfunction  (impaired relaxation).   3. Right ventricular systolic function is normal. The right ventricular  size is normal.   4. Left atrial size was mildly dilated.   5. The mitral valve is grossly normal. Mild mitral valve regurgitation.   6. The aortic valve is tricuspid. There is mild calcification of the  aortic valve. There is mild thickening of the aortic valve. Aortic valve  regurgitation is mild. Aortic valve sclerosis/calcification is present,  without any evidence of aortic  stenosis.   7. Aortic dilatation noted. There is mild dilatation of the aortic root,  measuring 40 mm. There is mild dilatation of the ascending aorta,  measuring 37 mm.   Comparison(s): No prior Echocardiogram.  Recent Labs: 07/28/2022: Hemoglobin 13.5; Magnesium 2.0; Platelets 214; TSH  1.100 08/28/2022: ALT 11 12/31/2022: BUN 17; Creatinine, Ser 1.18; Potassium 4.5; Sodium 137  Recent Lipid Panel    Component Value Date/Time   CHOL 190 08/28/2022 1115   TRIG 110 08/28/2022 1115   HDL 48 08/28/2022 1115   CHOLHDL 4.0 08/28/2022 1115   CHOLHDL 5.2 (H) 10/05/2019 0700   LDLCALC 122 (H) 08/28/2022 1115   LDLCALC 152 (H) 10/05/2019 0700     Risk Assessment/Calculations:       Physical Exam:    VS:  There were no vitals taken for this visit.    Wt Readings from Last 3 Encounters:  11/27/22 227 lb (103 kg)  09/29/22 228 lb (103.4 kg)  09/10/22 228 lb (103.4 kg)     GEN: Obese, well developed in no acute distress HEENT: Normal NECK: No JVD; No carotid bruits CARDIAC: RRR, no murmurs, rubs, gallops RESPIRATORY:  Clear to auscultation without rales, wheezing or rhonchi  ABDOMEN: Soft, non-tender, non-distended MUSCULOSKELETAL:  No edema; No deformity. 2+ pedal pulses, equal bilaterally SKIN: Warm and dry NEUROLOGIC:  Alert and oriented x 3 PSYCHIATRIC:  Normal affect   EKG:  EKG is ***   Diagnoses:    No diagnosis found.   Assessment and Plan:     Chronic HFrEF/Dilated CM: LVEF 35 to 40%, mildly dilated LV, normal RV, mild AI on echo 07/27/2022.  He denies dyspnea, edema, weight gain, orthopnea, early satiety, PND. No obvious signs of volume  overload, although body habitus makes it difficult to assess. Tolerating Jardiance without  concerning side effect.  Lengthy discussion about adding goal-directed medical therapy.  He is concerned about medication side effects. He would like to restart Entresto 24-26 mg twice daily. We will check BMP in 2 weeks. Encouraged him to monitor BP and notify us if he does not tolerate Entresto.   CAD without angina: Coronary CTA 07/28/22 revealed mild non-obstructive CAD. He denies chest pain, dyspnea, or other symptoms concerning for angina.  No indication for further ischemic evaluation at this time.  Secondary prevention  emphasized including weight loss, heart healthy, mostly plant-based diet, regular physical activity, and good cholesterol and blood pressure control. LDL not at goal as discussed below.   Hypertension: BP is initially elevated, improved on my recheck. We are adding Entresto 24-26 mg twice daily as noted above. I have asked him to monitor BP on a consistent basis and report if consistently > 140/80 or if he has low readings on Entresto.   Hyperlipidemia LDL goal < 70/aortic atherosclerosis: LDL 122 on 08/28/2022. Did not feel high dose statin was necessary since there is no coronary occlusion. Advised him to try rosuvastatin 5 mg daily which he politely refused at the time. Will continue discussion at next office visit.   Chronic DVT/PE: On Eliquis 2.5 mg twice daily, no bleeding concerns. Management per PCP.     Disposition: ***  Medication Adjustments/Labs and Tests Ordered: Current medicines are reviewed at length with the patient today.  Concerns regarding medicines are outlined above.  No orders of the defined types were placed in this encounter.  No orders of the defined types were placed in this encounter.   There are no Patient Instructions on file for this visit.   Signed, Levi Aland, NP  03/07/2023 7:31 AM    [facility]

## 2023-03-08 ENCOUNTER — Ambulatory Visit: Payer: Medicare PPO | Attending: Nurse Practitioner | Admitting: Nurse Practitioner

## 2023-03-08 ENCOUNTER — Encounter: Payer: Self-pay | Admitting: Nurse Practitioner

## 2023-03-09 ENCOUNTER — Other Ambulatory Visit: Payer: Self-pay | Admitting: Gastroenterology

## 2023-05-17 DIAGNOSIS — S0083XA Contusion of other part of head, initial encounter: Secondary | ICD-10-CM | POA: Diagnosis not present

## 2023-05-17 DIAGNOSIS — Z7901 Long term (current) use of anticoagulants: Secondary | ICD-10-CM | POA: Diagnosis not present

## 2023-05-17 DIAGNOSIS — S0990XA Unspecified injury of head, initial encounter: Secondary | ICD-10-CM | POA: Diagnosis not present

## 2023-05-17 DIAGNOSIS — S63641A Sprain of metacarpophalangeal joint of right thumb, initial encounter: Secondary | ICD-10-CM | POA: Diagnosis not present

## 2023-05-17 DIAGNOSIS — S0003XA Contusion of scalp, initial encounter: Secondary | ICD-10-CM | POA: Diagnosis not present

## 2023-05-17 DIAGNOSIS — S0081XA Abrasion of other part of head, initial encounter: Secondary | ICD-10-CM | POA: Diagnosis not present

## 2023-05-17 DIAGNOSIS — M25531 Pain in right wrist: Secondary | ICD-10-CM | POA: Diagnosis not present

## 2023-05-17 DIAGNOSIS — R9431 Abnormal electrocardiogram [ECG] [EKG]: Secondary | ICD-10-CM | POA: Diagnosis not present

## 2023-05-17 DIAGNOSIS — M542 Cervicalgia: Secondary | ICD-10-CM | POA: Diagnosis not present

## 2023-08-12 ENCOUNTER — Other Ambulatory Visit: Payer: Self-pay | Admitting: Nurse Practitioner

## 2023-08-30 ENCOUNTER — Encounter: Payer: Self-pay | Admitting: Orthopedic Surgery

## 2023-08-30 ENCOUNTER — Other Ambulatory Visit: Payer: Self-pay | Admitting: Nurse Practitioner

## 2023-08-30 ENCOUNTER — Non-Acute Institutional Stay: Payer: Medicare PPO | Admitting: Orthopedic Surgery

## 2023-08-30 VITALS — BP 128/70 | HR 57 | Temp 97.8°F | Resp 17 | Ht 71.0 in | Wt 224.1 lb

## 2023-08-30 DIAGNOSIS — N481 Balanitis: Secondary | ICD-10-CM | POA: Diagnosis not present

## 2023-08-30 DIAGNOSIS — L819 Disorder of pigmentation, unspecified: Secondary | ICD-10-CM | POA: Diagnosis not present

## 2023-08-30 DIAGNOSIS — L719 Rosacea, unspecified: Secondary | ICD-10-CM

## 2023-08-30 DIAGNOSIS — I82512 Chronic embolism and thrombosis of left femoral vein: Secondary | ICD-10-CM

## 2023-08-30 DIAGNOSIS — I2782 Chronic pulmonary embolism: Secondary | ICD-10-CM

## 2023-08-30 MED ORDER — CLOTRIMAZOLE 1 % EX CREA: 1.0000 | TOPICAL_CREAM | Freq: Two times a day (BID) | CUTANEOUS | 0 refills | Status: DC

## 2023-08-30 MED ORDER — FLUCONAZOLE 100 MG PO TABS: 200.0000 mg | ORAL_TABLET | Freq: Every day | ORAL | 0 refills | Status: AC

## 2023-08-30 NOTE — Patient Instructions (Addendum)
 Please finish antifungal medication  Apply clotrimazole cream to penis twice daily until resolved

## 2023-08-30 NOTE — Progress Notes (Signed)
 Location:  [facility] Service:  Clinic (12) Provider:  Greig FORBES Cluster, NP   [name], MD  Patient Care Team: [name], MD as PCP - General (Internal Medicine) Thukkani, Arun K, MD as PCP - Cardiology (Cardiology)  Extended Emergency Contact Information Primary Emergency Contact: Loar,Michele Home Phone: [phone] Relation: Daughter Secondary Emergency Contact: [facility] Phone: [phone] Relation: Sister  Code Status:  Full code Goals of care: Advanced Directive information    08/30/2023    1:35 PM  Advanced Directives  Does Patient Have a Medical Advance Directive? Yes  Type of Estate Agent of [place];Living will;Out of facility DNR (pink MOST or yellow form)  Does patient want to make changes to medical advance directive? No - Patient declined  Copy of Healthcare Power of Attorney in Chart? No - copy requested     Chief Complaint  Patient presents with   Acute Visit    Patient complains of trouble urinating, and rash on tip of penis. Patient states this rash has been present for 1 week. Patient has used neosporin on area but it's not working. Patient also mentioned area on lower right leg he would like to be looked at as well.     HPI:  Pt is a 89 y.o. male seen today for acute visit due to rash on penis.   He noticed rash on the tip of his penis extending to shaft x 1 week. Rash is very itchy. No discharge or pain. He tried putting neosporin on rash without relief. He has had rashes to peri area in past> groin folds. Peri area rashes typically occur in summer. Denies sexual activity. Urinary stream is different since rash started. Denies dysuria or frequency. Afebrile. Vitals stable.   Right shin with slight discoloration. Area is sometimes painful. Denies recent fall or injury. Requesting dermatology referral.   Diagnosed with rosacea last year. He tried metronidazole  1% gel without  success. Requesting in house dermatology referral.    Past Medical History:  Diagnosis Date   Apnea, sleep    Barrett's esophagus    Gout    Hypertension    Obesity    Pulmonary emboli [facility])    Past Surgical History:  Procedure Laterality Date   BIOPSY  09/29/2022   Procedure: BIOPSY;  Surgeon: Saintclair Jasper, MD;  Location: [facility];  Service: Gastroenterology;;   CHOLECYSTECTOMY  08/29/2017   ESOPHAGOGASTRODUODENOSCOPY (EGD) WITH PROPOFOL  N/A 09/29/2022   Procedure: ESOPHAGOGASTRODUODENOSCOPY (EGD) WITH PROPOFOL ;  Surgeon: Saintclair Jasper, MD;  Location: [facility];  Service: Gastroenterology;  Laterality: N/A;   EYE SURGERY Bilateral 2011-2012   catracts removed   HERNIA REPAIR  2005   T. Winnie MD   TONSILECTOMY/ADENOIDECTOMY WITH MYRINGOTOMY  1944    Allergies  Allergen Reactions   Penicillins Rash    Outpatient Encounter Medications as of 08/30/2023  Medication Sig   allopurinol  (ZYLOPRIM ) 100 MG tablet TAKE ONE TABLET BY MOUTH TWICE DAILY   apixaban  (ELIQUIS ) 2.5 MG TABS tablet Take 2.5 mg by mouth 2 (two) times a day.    calcium  carbonate (TUMS - DOSED IN MG ELEMENTAL CALCIUM ) 500 MG chewable tablet Chew 1 tablet by mouth daily as needed for indigestion.   empagliflozin  (JARDIANCE ) 10 MG TABS tablet TAKE 1 TABLET EVERY DAY BEFORE BREAKFAST   fluticasone (FLONASE) 50 MCG/ACT nasal spray Place 1 spray into both nostrils every evening.   hydrocortisone cream 1 % Apply 1 Application topically 2 (two) times daily as needed  for itching.   ibuprofen (ADVIL) 200 MG tablet Take 200 mg by mouth every 6 (six) hours as needed for moderate pain.   latanoprost  (XALATAN ) 0.005 % ophthalmic solution Place 1 drop into both eyes at bedtime.   loperamide (IMODIUM) 2 MG capsule Take 2 mg by mouth as needed for diarrhea or loose stools.   nitroGLYCERIN  (NITROSTAT ) 0.4 MG SL tablet Place 1 tablet (0.4 mg total) under the tongue every 5 (five) minutes as needed for chest pain.   omeprazole  (PRILOSEC) 20 MG capsule Take 20 mg by mouth in the morning and at bedtime.   Polyethyl Glycol-Propyl Glycol (SYSTANE OP) Place 1 drop into both eyes 2 (two) times daily.   Probiotic Product (ALIGN) 4 MG CAPS Take 4 mg by mouth daily.   sacubitril -valsartan  (ENTRESTO ) 24-26 MG Take 1 tablet by mouth 2 (two) times daily.   [DISCONTINUED] aspirin  EC 81 MG tablet Take 1 tablet (81 mg total) by mouth daily. Swallow whole.   No facility-administered encounter medications on file as of 08/30/2023.    Review of Systems  Constitutional: Negative.   HENT: Negative.    Eyes: Negative.   Respiratory:  Negative for shortness of breath.   Cardiovascular:  Negative for chest pain.  Gastrointestinal: Negative.   Genitourinary: Negative.  Negative for dysuria, frequency and hematuria.  Musculoskeletal: Negative.   Skin:  Positive for color change and rash.  Neurological: Negative.   Psychiatric/Behavioral: Negative.      Immunization History  Administered Date(s) Administered   Fluad Quad(high Dose 65+) 06/07/2019, 06/09/2021   Influenza Whole 05/26/2018   Influenza, High Dose Seasonal PF 06/02/2017, 06/05/2020, 05/24/2022   Influenza-Unspecified 06/24/2016   Moderna SARS-COV2 Booster Vaccination 12/18/2020   Moderna Sars-Covid-2 Vaccination 08/28/2019, 09/25/2019, 07/02/2020   Pfizer Covid-19 Vaccine Bivalent Booster 81yrs & up 05/13/2021, 04/09/2022   Pneumococcal Conjugate-13 09/12/2014   Pneumococcal Polysaccharide-23 12/17/2016   Pneumococcal-Unspecified 12/17/2016   Td 07/31/2016   Zoster Recombinant(Shingrix) 08/24/2005, 04/18/2019, 07/04/2019   Pertinent  Health Maintenance Due  Topic Date Due   INFLUENZA VACCINE  03/25/2023      07/27/2022    1:00 PM 07/27/2022    8:00 PM 07/28/2022    8:00 AM 09/10/2022    3:10 PM 08/30/2023    1:35 PM  Fall Risk  Falls in the past year?    0 0  Was there an injury with Fall?    0 0  Fall Risk Category Calculator    0 0  (RETIRED) Patient Fall  Risk Level High fall risk Moderate fall risk Moderate fall risk    Patient at Risk for Falls Due to    No Fall Risks No Fall Risks  Fall risk Follow up     Falls evaluation completed;Education provided;Falls prevention discussed   Functional Status Survey:    Vitals:   08/30/23 1329  BP: 128/70  Pulse: (!) 50  Resp: 17  Temp: 97.8 F (36.6 C)  SpO2: 93%  Weight: 224 lb 1.6 oz (101.7 kg)  Height: 5' 11 (1.803 m)   Body mass index is 31.26 kg/m. Physical Exam Vitals reviewed.  Constitutional:      General: He is not in acute distress. HENT:     Nose:     Comments: Redness to nose Eyes:     General:        Right eye: No discharge.        Left eye: No discharge.  Cardiovascular:     Rate and Rhythm: Normal  rate and regular rhythm.     Pulses: Normal pulses.     Heart sounds: Normal heart sounds.  Pulmonary:     Effort: Pulmonary effort is normal.     Breath sounds: Normal breath sounds.  Abdominal:     Palpations: Abdomen is soft.  Genitourinary:    Penis: Uncircumcised. Erythema present. No discharge.      Comments: Increased erythema to glans extending down shaft, no drainage or skin breakdown Musculoskeletal:        General: Normal range of motion.     Cervical back: Neck supple.  Skin:    General: Skin is warm.     Findings: Rash present.     Comments: Approx 1-2 cm round tan lesion to right shin, mild tenderness, no skin breakdown or drainage  Neurological:     General: No focal deficit present.     Mental Status: He is alert and oriented to person, place, and time.  Psychiatric:        Mood and Affect: Mood normal.     Labs reviewed: Recent Labs    12/31/22 0000  NA 137  K 4.5  CL 103  CO2 19*  GLUCOSE 102*  BUN 17  CREATININE 1.18  CALCIUM  9.1   No results for input(s): AST, ALT, ALKPHOS, BILITOT, PROT, ALBUMIN in the last 8760 hours. No results for input(s): WBC, NEUTROABS, HGB, HCT, MCV, PLT in the last 8760  hours. Lab Results  Component Value Date   TSH 1.100 07/28/2022   Lab Results  Component Value Date   HGBA1C 5.9 (H) 10/05/2019   Lab Results  Component Value Date   CHOL 190 08/28/2022   HDL 48 08/28/2022   LDLCALC 122 (H) 08/28/2022   TRIG 110 08/28/2022   CHOLHDL 4.0 08/28/2022    Significant Diagnostic Results in last 30 days:  No results found.  Assessment/Plan 1. Balanitis (Primary) - onset x 1 week - uncircumcised> increased itching and erythema to glans/shaft - will try oral and topical antifungal due to symptoms  - fluconazole  (DIFLUCAN ) 100 MG tablet; Take 2 tablets (200 mg total) by mouth daily for 7 days.  Dispense: 14 tablet; Refill: 0 - clotrimazole  (LOTRIMIN ) 1 % cream; Apply 1 Application topically 2 (two) times daily. May stop when resolved.  Dispense: 30 g; Refill: 0  2. Rosacea - ongoing - unsuccessful trial metronidazole  1% - will refer to in house dermatology  3. Discoloration of skin of lower leg - suspicious tan lesion to leg - ? Bruise - in house dermatology referral    Family/ staff Communication: plan discussed with patient and nurse  Labs/tests ordered:  none

## 2023-09-27 ENCOUNTER — Other Ambulatory Visit: Payer: Self-pay | Admitting: *Deleted

## 2023-09-27 DIAGNOSIS — I82512 Chronic embolism and thrombosis of left femoral vein: Secondary | ICD-10-CM

## 2023-09-27 DIAGNOSIS — I2782 Chronic pulmonary embolism: Secondary | ICD-10-CM

## 2023-09-27 MED ORDER — APIXABAN 2.5 MG PO TABS: 2.5000 mg | ORAL_TABLET | Freq: Two times a day (BID) | ORAL | 1 refills | Status: DC

## 2023-09-27 MED ORDER — ALLOPURINOL 100 MG PO TABS: 100.0000 mg | ORAL_TABLET | Freq: Two times a day (BID) | ORAL | 1 refills | Status: DC

## 2023-09-27 NOTE — Telephone Encounter (Signed)
[facility] requested refill.  

## 2023-09-27 NOTE — Telephone Encounter (Signed)
Received fax from [employer].

## 2023-10-05 DIAGNOSIS — L719 Rosacea, unspecified: Secondary | ICD-10-CM | POA: Diagnosis not present

## 2023-10-05 DIAGNOSIS — L820 Inflamed seborrheic keratosis: Secondary | ICD-10-CM | POA: Diagnosis not present

## 2023-10-29 ENCOUNTER — Other Ambulatory Visit: Payer: Self-pay | Admitting: Internal Medicine

## 2023-11-30 DIAGNOSIS — L820 Inflamed seborrheic keratosis: Secondary | ICD-10-CM | POA: Diagnosis not present

## 2023-11-30 DIAGNOSIS — L719 Rosacea, unspecified: Secondary | ICD-10-CM | POA: Diagnosis not present

## 2024-01-13 ENCOUNTER — Other Ambulatory Visit: Payer: Self-pay | Admitting: Internal Medicine

## 2024-02-03 ENCOUNTER — Other Ambulatory Visit: Payer: Self-pay | Admitting: Internal Medicine

## 2024-02-20 ENCOUNTER — Other Ambulatory Visit: Payer: Self-pay | Admitting: Sports Medicine

## 2024-02-20 DIAGNOSIS — I2782 Chronic pulmonary embolism: Secondary | ICD-10-CM

## 2024-02-20 DIAGNOSIS — I82512 Chronic embolism and thrombosis of left femoral vein: Secondary | ICD-10-CM

## 2024-02-26 ENCOUNTER — Other Ambulatory Visit: Payer: Self-pay | Admitting: Nurse Practitioner

## 2024-03-20 ENCOUNTER — Other Ambulatory Visit: Payer: Self-pay | Admitting: Nurse Practitioner

## 2024-03-29 ENCOUNTER — Telehealth: Payer: Self-pay | Admitting: Internal Medicine

## 2024-03-29 NOTE — Telephone Encounter (Signed)
*  STAT* If patient is at the pharmacy, call can be transferred to refill team.   1. Which medications need to be refilled? (please list name of each medication and dose if known) sacubitril -valsartan  (ENTRESTO ) 24-26 MG    2. Would you like to learn more about the convenience, safety, & potential cost savings by using the [facility]?     3. Are you open to using the [facility] (Type [facility].  ).   4. Which pharmacy/location (including street and city if local pharmacy) is medication to be sent to? [facility] [place], [place] - [address]    5. Do they need a 30 day or 90 day supply? 90 day

## 2024-03-30 ENCOUNTER — Ambulatory Visit: Admitting: Nurse Practitioner

## 2024-03-30 ENCOUNTER — Encounter: Payer: Self-pay | Admitting: Nurse Practitioner

## 2024-03-30 VITALS — BP 122/78 | HR 66 | Temp 97.7°F | Ht 71.0 in | Wt 217.8 lb

## 2024-03-30 DIAGNOSIS — Z0000 Encounter for general adult medical examination without abnormal findings: Secondary | ICD-10-CM | POA: Diagnosis not present

## 2024-03-30 MED ORDER — SACUBITRIL-VALSARTAN 24-26 MG PO TABS: 1.0000 | ORAL_TABLET | Freq: Two times a day (BID) | ORAL | 0 refills | Status: DC

## 2024-03-30 NOTE — Patient Instructions (Addendum)
 Labs next Tuesday August 12th 2025. Please be to the lab at 7am.

## 2024-03-30 NOTE — Telephone Encounter (Signed)
 Refills has been sent to the pharmacy.

## 2024-03-30 NOTE — Progress Notes (Unsigned)
 Subjective:   Eric Chung is a 89 y.o. male who presents for Medicare Annual/Subsequent preventive examination @ [facility]  Visit Complete: In person  Patient Medicare AWV questionnaire was completed by the patient on 03/30/24; I have confirmed that all information answered by patient is correct and no changes since this date.  Cardiac Risk Factors include: advanced age (>48men, >50 women);dyslipidemia;hypertension;obesity (BMI >30kg/m2)     Objective:    Today's Vitals   03/30/24 1443  BP: 122/78  Pulse: 66  Temp: 97.7 F (36.5 C)  SpO2: 94%  Weight: 217 lb 12.8 oz (98.8 kg)  Height: 5' 11 (1.803 m)   Body mass index is 30.38 kg/m.     08/30/2023    1:35 PM 09/10/2022    3:10 PM 08/27/2022    8:09 AM 07/27/2022    6:00 PM 07/26/2022    8:56 PM 07/24/2022    8:21 AM 05/23/2022   12:29 PM  Advanced Directives  Does Patient Have a Medical Advance Directive? Yes Yes Yes  Yes Yes No  Type of Estate agent of [place];Living will;Out of facility DNR (pink MOST or yellow form) Living will Living will Healthcare Power of [employer] of [place];Living will    Does patient want to make changes to medical advance directive? No - Patient declined  No - Patient declined No - Patient declined  No - Patient declined   Copy of Healthcare Power of Attorney in Chart? No - copy requested        Would patient like information on creating a medical advance directive?       No - Patient declined    Current Medications (verified) Outpatient Encounter Medications as of 03/30/2024  Medication Sig   allopurinol  (ZYLOPRIM ) 100 MG tablet Take 1 tablet (100 mg total) by mouth 2 (two) times daily.   calcium  carbonate (TUMS - DOSED IN MG ELEMENTAL CALCIUM ) 500 MG chewable tablet Chew 1 tablet by mouth daily as needed for indigestion.   clotrimazole  (LOTRIMIN ) 1 % cream Apply 1 Application topically 2 (two) times daily. May stop when resolved.   ELIQUIS  2.5 MG TABS  tablet TAKE 1 TABLET TWICE DAILY   empagliflozin  (JARDIANCE ) 10 MG TABS tablet TAKE 1 TABLET EVERY DAY (PLEASE SCHEDULE AN APPOINTMENT WITH CARDIOLOGY FOR MORE REFILLS [phone])   fluticasone (FLONASE) 50 MCG/ACT nasal spray Place 1 spray into both nostrils every evening. (Patient taking differently: Place 1 spray into both nostrils as needed for allergies or rhinitis.)   hydrocortisone cream 1 % Apply 1 Application topically 2 (two) times daily as needed for itching.   ibuprofen (ADVIL) 200 MG tablet Take 200 mg by mouth every 6 (six) hours as needed for moderate pain.   latanoprost  (XALATAN ) 0.005 % ophthalmic solution Place 1 drop into both eyes at bedtime.   loperamide (IMODIUM) 2 MG capsule Take 2 mg by mouth as needed for diarrhea or loose stools.   metroNIDAZOLE  (METROCREAM ) 0.75 % cream Apply 1 Application topically 2 (two) times daily.   omeprazole (PRILOSEC) 20 MG capsule Take 20 mg by mouth in the morning and at bedtime.   Polyethyl Glycol-Propyl Glycol (SYSTANE OP) Place 1 drop into both eyes 2 (two) times daily.   sacubitril -valsartan  (ENTRESTO ) 24-26 MG Take 1 tablet by mouth 2 (two) times daily.   nitroGLYCERIN  (NITROSTAT ) 0.4 MG SL tablet Place 1 tablet (0.4 mg total) under the tongue every 5 (five) minutes as needed for chest pain. (Patient not taking: Reported on 03/30/2024)  Probiotic Product (ALIGN) 4 MG CAPS Take 4 mg by mouth daily. (Patient not taking: Reported on 03/30/2024)   [DISCONTINUED] sacubitril -valsartan  (ENTRESTO ) 24-26 MG TAKE ONE TABLET BY MOUTH TWICE DAILY   No facility-administered encounter medications on file as of 03/30/2024.    Allergies (verified) Penicillins   History: Past Medical History:  Diagnosis Date   Apnea, sleep    Barrett's esophagus    Gout    Hypertension    Obesity    Pulmonary emboli [facility])    Past Surgical History:  Procedure Laterality Date   BIOPSY  09/29/2022   Procedure: BIOPSY;  Surgeon: Saintclair Jasper, MD;  Location: [facility]  ENDOSCOPY;  Service: Gastroenterology;;   CHOLECYSTECTOMY  08/29/2017   ESOPHAGOGASTRODUODENOSCOPY (EGD) WITH PROPOFOL  N/A 09/29/2022   Procedure: ESOPHAGOGASTRODUODENOSCOPY (EGD) WITH PROPOFOL ;  Surgeon: Saintclair Jasper, MD;  Location: [facility] ENDOSCOPY;  Service: Gastroenterology;  Laterality: N/A;   EYE SURGERY Bilateral 2011-2012   catracts removed   HERNIA REPAIR  2005   T. Winnie MD   TONSILECTOMY/ADENOIDECTOMY WITH MYRINGOTOMY  1944   Family History  Problem Relation Age of Onset   Osteoporosis Mother    Cancer Father 61       esophagus   Crohn's disease Sister    Birth defects Maternal Grandmother        colon   Diabetes Paternal Grandmother    Social History   Socioeconomic History   Marital status: Married    Spouse name: Dickey   Number of children: 1   Years of education: Not on file   Highest education level: Not on file  Occupational History   Not on file  Tobacco Use   Smoking status: Former    Current packs/day: 0.00    Average packs/day: 1 pack/day for 15.0 years (15.0 ttl pk-yrs)    Types: Cigarettes    Start date: 08/24/1969    Quit date: 08/24/1984    Years since quitting: 39.6   Smokeless tobacco: Never  Vaping Use   Vaping status: Never Used  Substance and Sexual Activity   Alcohol  use: Yes    Alcohol /week: 3.0 - 4.0 standard drinks of alcohol     Types: 3 - 4 Standard drinks or equivalent per week    Comment: 2-3 beers a month   Drug use: Not Currently   Sexual activity: Not Currently  Other Topics Concern   Not on file  Social History Narrative   Social History     Marital status: Married ,1986            Spouse name: Dickey                    Years of education:                 Number of children: 1                Occupational History: Runner, broadcasting/film/video     None on file      Social History Main Topics     Smoking status:                       Smokeless tobacco: Not on file                        Alcohol  use: 3-4 drinks per week     Drug use: Unknown      Sexual activity: Not on file  Other Topics            Concern     None on file      Social History Narrative     None on file            Social Drivers of Health   Financial Resource Strain: Low Risk  (07/11/2018)   Overall Financial Resource Strain (CARDIA)    Difficulty of Paying Living Expenses: Not hard at all  Food Insecurity: No Food Insecurity (07/27/2022)   Hunger Vital Sign    Worried About Running Out of Food in the Last Year: Never true    Ran Out of Food in the Last Year: Never true  Transportation Needs: No Transportation Needs (07/27/2022)   PRAPARE - Administrator, Civil Service (Medical): No    Lack of Transportation (Non-Medical): No  Physical Activity: Inactive (07/11/2018)   Exercise Vital Sign    Days of Exercise per Week: 0 days    Minutes of Exercise per Session: 0 min  Stress: No Stress Concern Present (07/11/2018)   [employer] of Occupational Health - Occupational Stress Questionnaire    Feeling of Stress : Only a little  Social Connections: Unknown (12/24/2021)   [facility] from [facility]   Social Network    Social Network: Not on file    Tobacco Counseling Counseling given: Not Answered   Clinical Intake:  Pre-visit preparation completed: Yes  Pain : No/denies pain     BMI - recorded: 30.38 Nutritional Status: BMI > 30  Obese Nutritional Risks:  (#7 ibs weight loss in the past 6-8 months, intentional) Diabetes: No  How often do you need to have someone help you when you read instructions, pamphlets, or other written materials from your doctor or pharmacy?: 1 - Never What is the last grade level you completed in school?: PHD  Interpreter Needed?: No  Information entered by :: Kristina Mcnorton Lorenda Hark NP   Activities of Daily Living    03/30/2024    3:20 PM  In your present state of health, do you have any difficulty performing the following activities:  Hearing? 0  Vision? 0  Difficulty concentrating or making  decisions? 0  Walking or climbing stairs? 0  Dressing or bathing? 0  Doing errands, shopping? 0  Preparing Food and eating ? N  Using the Toilet? N  In the past six months, have you accidently leaked urine? Y  Do you have problems with loss of bowel control? N  Managing your Medications? N  Managing your Finances? N  Housekeeping or managing your Housekeeping? N    Patient Care Team: [name], MD as PCP - General (Internal Medicine) Thukkani, Arun K, MD as PCP - Cardiology (Cardiology)  Indicate any recent Medical Services you may have received from other than [facility] providers in the past year (date may be approximate).     Assessment:   This is a routine wellness examination for [facility].  Hearing/Vision screen Hearing Screening - Comments:: Hearing is good  Vision Screening - Comments:: Patient stated he still has a lot of blurriness in both eyes    Goals Addressed             This Visit's Progress    Functional Decline Minimized       Evidence-based guidance:  Assess fall risk, including balance and gait impairment, muscle weakness, diminished vision or hearing, environmental hazards, effects of medication and dehydration.  Assess and review gait speed, decreased muscle strength  or impaired functional mobility; consider use of validated tool if available.  Prepare patient for rehabilitation services to develop an individualized activity and exercise program as indicated, such as progressive resistance strengthening, balance and activity tolerance training or home exercise program.  Prevent falls with environmental adjustment; encourage compliance with exercise program, management of incontinence and adequate vitamin D and calcium  intake from food or supplements.  Promote gradual increase in intensity of activity and exercise as tolerated, such as duration, frequency, exercise repetition and sets and intensity.  Provide guidance and interventions aimed at fall  prevention.  Review or assess presence of reduced muscle mass or results of dual-energy x-ray absorptiometry.   Notes:        Depression Screen    03/30/2024    3:24 PM 03/30/2024    2:45 PM 08/31/2023    1:42 PM 07/03/2021    1:33 PM 10/01/2020    8:40 AM 07/11/2018   10:33 AM 05/27/2017    9:55 AM  PHQ 2/9 Scores  PHQ - 2 Score 0 1 0 0 0 0 0    Fall Risk    03/30/2024    2:44 PM 08/30/2023    1:35 PM 09/10/2022    3:10 PM 03/25/2022    3:39 PM 07/03/2021    1:33 PM  Fall Risk   Falls in the past year? 1 0 0 0 0  Number falls in past yr: 0 0 0 0 0  Injury with Fall? 0 0 0 0 0  Risk for fall due to : No Fall Risks No Fall Risks No Fall Risks No Fall Risks No Fall Risks  Follow up Falls evaluation completed Falls evaluation completed;Education provided;Falls prevention discussed  Falls evaluation completed  Falls evaluation completed      Data saved with a previous flowsheet row definition    MEDICARE RISK AT HOME: Medicare Risk at Home Any stairs in or around the home?: Yes If so, are there any without handrails?: No Home free of loose throw rugs in walkways, pet beds, electrical cords, etc?: Yes Adequate lighting in your home to reduce risk of falls?: Yes Life alert?: No Use of a cane, walker or w/c?: No Grab bars in the bathroom?: Yes Shower chair or bench in shower?: Yes Elevated toilet seat or a handicapped toilet?: Yes  TIMED UP AND GO:  Was the test performed?  No    Cognitive Function:    09/10/2022    3:23 PM 07/11/2018   10:55 AM 05/27/2017   10:22 AM  MMSE - Mini Mental State Exam  Orientation to time 5 5 4    Orientation to Place 5 5 5    Registration 3 3 3    Attention/ Calculation 5 5 5    Recall 3 3 3    Language- name 2 objects 2 2 2    Language- repeat 1 1 1   Language- follow 3 step command 3 3 2    Language- read & follow direction 1 1 1    Write a sentence 1 1 1    Copy design 1 1 1    Total score 30 30 28       Data saved with a previous flowsheet row  definition        03/30/2024    2:51 PM 10/01/2020    8:44 AM  6CIT Screen  What Year? 0 points 0 points  What month? 0 points 0 points  What time? 0 points 0 points  Count back from 20 0 points 0 points  Months in  reverse 0 points 0 points  Repeat phrase 0 points 0 points  Total Score 0 points 0 points    Immunizations Immunization History  Administered Date(s) Administered   Fluad Quad(high Dose 65+) 06/07/2019, 06/09/2021   Influenza Whole 05/26/2018   Influenza, High Dose Seasonal PF 06/02/2017, 06/05/2020, 05/24/2022   Influenza-Unspecified 06/24/2016   Moderna SARS-COV2 Booster Vaccination 12/18/2020   Moderna Sars-Covid-2 Vaccination 08/28/2019, 09/25/2019, 07/02/2020   Pfizer Covid-19 Vaccine Bivalent Booster 26yrs & up 05/13/2021, 04/09/2022   Pneumococcal Conjugate-13 09/12/2014   Pneumococcal Polysaccharide-23 12/17/2016   Pneumococcal-Unspecified 12/17/2016   Td 07/31/2016   Zoster Recombinant(Shingrix) 08/24/2005, 04/18/2019, 07/04/2019    TDAP status: Up to date  Flu Vaccine status: Up to date  Pneumococcal vaccine status: Up to date  Covid-19 vaccine status: Completed vaccines  Qualifies for Shingles Vaccine? Yes   Zostavax completed Yes   Shingrix Completed?: Yes  Screening Tests Health Maintenance  Topic Date Due   COVID-19 Vaccine (7 - 2024-25 season) 04/25/2023   INFLUENZA VACCINE  03/24/2024   Medicare Annual Wellness (AWV)  03/30/2025   DTaP/Tdap/Td (2 - Tdap) 07/31/2026   Pneumococcal Vaccine: 50+ Years  Completed   Zoster Vaccines- Shingrix  Completed   Hepatitis B Vaccines  Aged Out   HPV VACCINES  Aged Out   Meningococcal B Vaccine  Aged Out    Health Maintenance  Health Maintenance Due  Topic Date Due   COVID-19 Vaccine (7 - 2024-25 season) 04/25/2023   INFLUENZA VACCINE  03/24/2024    Colorectal cancer screening: No longer required.   Lung Cancer Screening: (Low Dose CT Chest recommended if Age 52-80 years, 20 pack-year  currently smoking OR have quit w/in 15years.) does not qualify.   Lung Cancer Screening Referral: NA  Additional Screening:  Hepatitis C Screening: does not qualify;   Vision Screening: Recommended annual ophthalmology exams for early detection of glaucoma and other disorders of the eye. Is the patient up to date with their annual eye exam?  Yes  Who is the provider or what is the name of the office in which the patient attends annual eye exams? Dr. [facility].  If pt is not established with a provider, would they like to be referred to a provider to establish care? No .   Dental Screening: Recommended annual dental exams for proper oral hygiene  Diabetic Foot Exam: NA  Community Resource Referral / Chronic Care Management: CRR required this visit?  No   CCM required this visit?  {CCM Required choices:[phone]}     Plan:     I have personally reviewed and noted the following in the patient's chart:   Medical and social history Use of alcohol , tobacco or illicit drugs  Current medications and supplements including opioid prescriptions. Patient is not currently taking opioid prescriptions. Functional ability and status Nutritional status Physical activity Advanced directives List of other physicians Hospitalizations, surgeries, and ER visits in previous 12 months Vitals Screenings to include cognitive, depression, and falls Referrals and appointments  In addition, I have reviewed and discussed with patient certain preventive protocols, quality metrics, and best practice recommendations. A written personalized care plan for preventive services as well as general preventive health recommendations were provided to patient.     Sakshi Sermons X Jahdai Padovano, NP   03/30/2024   After Visit Summary: (In Person-Declined) Patient declined AVS at this time.

## 2024-03-31 ENCOUNTER — Encounter: Payer: Self-pay | Admitting: Nurse Practitioner

## 2024-04-04 DIAGNOSIS — Z0000 Encounter for general adult medical examination without abnormal findings: Secondary | ICD-10-CM | POA: Diagnosis not present

## 2024-04-07 ENCOUNTER — Ambulatory Visit: Payer: Self-pay | Admitting: Nurse Practitioner

## 2024-04-08 LAB — COMPLETE METABOLIC PANEL WITHOUT GFR
AG Ratio: 1.4 (calc) (ref 1.0–2.5)
ALT: 8 U/L — ABNORMAL LOW (ref 9–46)
AST: 15 U/L (ref 10–35)
Albumin: 3.8 g/dL (ref 3.6–5.1)
Alkaline phosphatase (APISO): 80 U/L (ref 35–144)
BUN: 14 mg/dL (ref 7–25)
CO2: 24 mmol/L (ref 20–32)
Calcium: 9.1 mg/dL (ref 8.6–10.3)
Chloride: 105 mmol/L (ref 98–110)
Creat: 1.01 mg/dL (ref 0.70–1.22)
Globulin: 2.8 g/dL (ref 1.9–3.7)
Glucose, Bld: 82 mg/dL (ref 65–99)
Potassium: 4.2 mmol/L (ref 3.5–5.3)
Sodium: 136 mmol/L (ref 135–146)
Total Bilirubin: 1.1 mg/dL (ref 0.2–1.2)
Total Protein: 6.6 g/dL (ref 6.1–8.1)

## 2024-04-08 LAB — LIPID PANEL
Cholesterol: 201 mg/dL — ABNORMAL HIGH (ref <200)
HDL: 49 mg/dL (ref >=40)
LDL Cholesterol (Calc): 130 mg/dL — ABNORMAL HIGH
Non-HDL Cholesterol (Calc): 152 mg/dL — ABNORMAL HIGH (ref <130)
Total CHOL/HDL Ratio: 4.1 (calc) (ref <5.0)
Triglycerides: 110 mg/dL (ref <150)

## 2024-04-08 LAB — TSH: TSH: 3.52 m[IU]/L (ref 0.40–4.50)

## 2024-04-08 LAB — CBC WITH DIFFERENTIAL/PLATELET
Absolute Lymphocytes: 3402 {cells}/uL (ref 850–3900)
Absolute Monocytes: 524 {cells}/uL (ref 200–950)
Basophils Absolute: 28 {cells}/uL (ref 0–200)
Basophils Relative: 0.4 %
Eosinophils Absolute: 214 {cells}/uL (ref 15–500)
Eosinophils Relative: 3.1 %
HCT: 45.3 % (ref 38.5–50.0)
Hemoglobin: 14.8 g/dL (ref 13.2–17.1)
MCH: 31.5 pg (ref 27.0–33.0)
MCHC: 32.7 g/dL (ref 32.0–36.0)
MCV: 96.4 fL (ref 80.0–100.0)
MPV: 10.1 fL (ref 7.5–12.5)
Monocytes Relative: 7.6 %
Neutro Abs: 2732 {cells}/uL (ref 1500–7800)
Neutrophils Relative %: 39.6 %
Platelets: 252 Thousand/uL (ref 140–400)
RBC: 4.7 Million/uL (ref 4.20–5.80)
RDW: 13.9 % (ref 11.0–15.0)
Total Lymphocyte: 49.3 %
WBC: 6.9 Thousand/uL (ref 3.8–10.8)

## 2024-04-08 LAB — VITAMIN D 1,25 DIHYDROXY
Vitamin D 1, 25 (OH)2 Total: 38 pg/mL (ref 18–72)
Vitamin D2 1, 25 (OH)2: <8 pg/mL
Vitamin D3 1, 25 (OH)2: 38 pg/mL

## 2024-04-08 LAB — VITAMIN B12: Vitamin B-12: 363 pg/mL (ref 200–1100)

## 2024-04-08 LAB — HEMOGLOBIN A1C
Hgb A1c MFr Bld: 6.1 % — ABNORMAL HIGH (ref <5.7)
Mean Plasma Glucose: 128 mg/dL
eAG (mmol/L): 7.1 mmol/L

## 2024-04-13 DIAGNOSIS — H401131 Primary open-angle glaucoma, bilateral, mild stage: Secondary | ICD-10-CM | POA: Diagnosis not present

## 2024-04-13 DIAGNOSIS — H04123 Dry eye syndrome of bilateral lacrimal glands: Secondary | ICD-10-CM | POA: Diagnosis not present

## 2024-04-13 DIAGNOSIS — Z961 Presence of intraocular lens: Secondary | ICD-10-CM | POA: Diagnosis not present

## 2024-04-17 ENCOUNTER — Other Ambulatory Visit: Payer: Self-pay | Admitting: Internal Medicine

## 2024-04-21 ENCOUNTER — Other Ambulatory Visit: Payer: Self-pay | Admitting: Nurse Practitioner

## 2024-04-25 ENCOUNTER — Other Ambulatory Visit: Payer: Self-pay | Admitting: Internal Medicine

## 2024-05-15 ENCOUNTER — Telehealth: Payer: Self-pay | Admitting: Sports Medicine

## 2024-05-15 ENCOUNTER — Other Ambulatory Visit: Payer: Self-pay | Admitting: Nurse Practitioner

## 2024-05-15 MED ORDER — METRONIDAZOLE 0.75 % EX CREA: 1.0000 | TOPICAL_CREAM | Freq: Two times a day (BID) | CUTANEOUS | 3 refills | Status: AC

## 2024-05-15 NOTE — Telephone Encounter (Signed)
 Patient medication hasn't been prescribed by pcp

## 2024-05-15 NOTE — Telephone Encounter (Signed)
 Noted

## 2024-05-15 NOTE — Telephone Encounter (Signed)
 Eric Madden, MD to Me (Selected Message)     05/15/24  2:22 PM Refill done

## 2024-05-15 NOTE — Telephone Encounter (Signed)
 Copied from CRM [id number]. Topic: Clinical - Medication Refill >> May 15, 2024  1:38 PM Zane F wrote: Caller: Kim  Calling from: [facility]  Informing the office they had received a refusal on the prescription listed below. They called to provide reason, which was that the patient no longer sees the dermatologist who originally prescribed the medication. The patient requires a refill so the pharmacy sent it to his last known primary care provider. Please assist with prescription refill or contact patient to provide further clarification.   Patient is in need of the cream due to rosacea flare up.    Medication: metroNIDAZOLE  (METROCREAM ) 0.75 % cream  Has the patient contacted their pharmacy? Yes  This is the patient's preferred pharmacy:  [facility] [place], [place] - [address] [facility] [address] [place] [address] Phone: [phone] Fax: [phone]    Is this the correct pharmacy for this prescription? Yes   Has the prescription been filled recently? No  Is the patient out of the medication? Yes  Has the patient been seen for an appointment in the last year OR does the patient have an upcoming appointment? Yes  Can we respond through MyChart? Yes  Agent: Please be advised that Rx refills may take up to 3 business days. We ask that you follow-up with your pharmacy.

## 2024-05-15 NOTE — Telephone Encounter (Signed)
Message routed to PCP Venita Sheffield, MD

## 2024-05-17 NOTE — Progress Notes (Unsigned)
 Cardiology Office Note   Date:  05/18/2024  ID:  Eric Chung, DOB [birth year], MRN [id number] PCP: [name], MD  [facility] Providers Cardiologist:  [name], MD   History of Present Illness Eric Chung is a 89 y.o. 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  Was originally referred to cardiology by PCP 07/07/2021 and seen by Dr. Red for evaluation of skipped heartbeats with recent EKG demonstrating normal sinus rhythm with RBBB, left anterior fascicular block, and PVCs.  At that time he reported an extensive cardiac evaluation years prior to hernia surgery.  Results of that exam reportedly benign although results not available to view.  TTE 07/30/2021 revealed LVEF 30 to 35%, difficult to assess wall motion due to very frequent PVCs and septal lateral dyssynchrony.  Overall appears to have global HK, mildly dilated LV cavity and mild LVH, grade 1 DD, normal RV, mildly dilated LA, mild MR, mild calcification of the aortic valve, mild AI, no evidence of aortic stenosis.  Mild dilation of the aortic root measuring 40 mm and mild dilation of the ascending aorta measuring 37 mm.  No prior echo for comparison.  Was advised to DC verapamil and start Toprol  XL 25 mg daily.  Return to the office February 2023 he and at that time did not wish to undergo any invasive assessment and therefore no plan for cardiac catheterization.  Toprol -XL was reduced to 12.5 mg daily and he was advised to start losartan  25 mg and Jardiance  10 mg daily.  TTE 01/05/2022 revealed LVEF 35 to 40%, moderately reduced LV function, global HK, indeterminate diastolic filling pressures, no significant change from previous study.  Was seen in September 2023 by Dr. Red at which time he was in stable  condition.  Plan to repeat an echo in 3 months and continue current medical therapy.  Would consider addition of spironolactone if EF remains reduced.  Patient was admitted 12/3 through 07/28/2022 for left-sided shoulder pain that occurred while watching TV and moved across his chest and back as well as diaphoresis, nausea, lightheadedness.  Was given sublingual NTG with improvement of pain but he continued to have sharp pain with deep inspiration to the point was difficult for him to take a deep breath.  He was given morphine  for pain but that only helped short-term.  In the ED, serum creatinine 1.25, high-sensitivity troponin 30 and then 29 with other labs unremarkable.  Chest CT showed no evidence of CHF but did show findings assistant with acute bronchitis and small airway disease.  Noted to have trace three-vessel coronary calcifications.  EKG was nonischemic with normal sinus rhythm and chronic RBBB and left anterior fascicular block.  Cardiology was asked to consult.  Troponin trend 30>>> 29>>> 67>>> 62.  No pericardial effusion on CT but clinically symptoms consistent with pericarditis.  ESR was normal.  Coronary CTA 07/28/2022 with calcium  score 41, 25 to 49% pLAD/OM1 and dRCA stenosis and aortic atherosclerosis.  Noncardiac portion showed possible early ILD versus aspiration with small hiatal hernia possible esophageal dysmotility.  Suspicion that chest pain was related to GI etiology with GERD, esophageal dysmotility and possible pleuritic chest pain from aspiration.  ESR normal with no evidence of pericardial effusion on echo or CTA so colchicine  was stopped.  Losartan  was changed to Entresto  24/26 twice daily.  No additional medication changes  were made due to soft BP.  Was seen in the cardiology clinic 08/28/2022 for posthospital follow-up.  Reportedly was feeling well he had stopped many of his cardiac medications due to diarrhea and lightheadedness.  PCP encouraged him to take Jardiance .  Concerned  about his gut health secondary to his father dying of esophageal cancer and grandmother dying of colon cancer.  Had a bout of chest pain that sent him to the hospital 12/3 and reported he was giving medication soon after arrival to relieve the symptoms.  Denied any palpitations, dyspnea, with apnea, PND, syncope.  Has difficulty with coughing at night.  He was advised to continue Jardiance  10 mg daily and metoprolol  if heart rate consistently over 100 bpm.  LDL measured at the time of visit was 122 and advised to start rosuvastatin 5 mg daily.  Liver and kidney function was stable.  At his last visit, he reported feeling well but unfortunately continued have diarrhea which had somewhat improved.  No clear source.  No further episodes of nearly blacking out.  Feels that he may have been secondary to metoprolol .  Feeling overall well without any dyspnea, orthopnea, edema, PND, early satiety, chest pain, palpitations, syncope/presyncope.  Has started exercising with walking and enrolling in strengthening classes.  Today, he presents with dilated cardiomyopathy and coronary artery disease for cardiovascular follow-up.  He experiences occasional episodes of wooziness or a need to sit down, which are not daily occurrences. He practices meditation techniques to manage his blood pressure, which is lower than usual. He is also trying to drink 64 ounces of water daily to stay hydrated.  He has dilated cardiomyopathy with a reduced ejection fraction of 35-40% and is currently taking Entresto  and Jardiance  for heart failure management.  Coronary artery disease was identified on a CT scan in 2023, with mild disease in the proximal LAD, OM1, and right coronary arteries. Chest pain was attributed to esophageal spasm rather than coronary artery disease.  He has elevated LDL cholesterol, with a recent level of 130 mg/dL. He previously tried rosuvastatin but experienced muscle cramps and spasms, leading to  discontinuation. He is not currently on any cholesterol-lowering medication.  Reports no shortness of breath nor dyspnea on exertion. Reports no chest pain, pressure, or tightness. No edema, orthopnea, PND. Reports no palpitations.   Discussed the use of AI scribe software for clinical note transcription with the patient, who gave verbal consent to proceed.   ROS: Pertinent ROS in HPI  Studies Reviewed      CCTA 07/28/22 1. Coronary calcium  score of 41.1. This was 8 percentile for age-, sex, and race-matched controls.   2. Normal coronary origin with right dominance.   3. Mild (25-49) stenoses in the proximal LAD, OM1 and distal RCA.   4. Aortic atherosclerosis.   Echo 07/27/22 1. Exremelly poor acoustic windows llimit study, even with Definity  use.  No significant change in reported LVEF from previous echo. Left  ventricular ejection fraction, by estimation, is 35 to 40%. The left  ventricle has moderately decreased function.  The left ventricular internal cavity size was mildly dilated.   2. Right ventricular systolic function is normal. The right ventricular  size is normal.   3. The mitral valve is normal in structure. No evidence of mitral valve  regurgitation.   4. The aortic valve is normal in structure. Aortic valve regurgitation is  mild.   Echo 07/30/21 1. Very frequent PVCs during the study.   2. Left ventricular ejection fraction, by  estimation, is 30 to 35%. The  left ventricle has moderately decreased function. Difficult to assess wall  motion due to very frequent PVCs with septal-lateral dyssynchrony.  Overall, appears to have global  hypokinesis. The left ventricular internal cavity size was mildly dilated.  There is mild concentric left ventricular hypertrophy. Left ventricular  diastolic parameters are consistent with Grade I diastolic dysfunction  (impaired relaxation).   3. Right ventricular systolic function is normal. The right ventricular  size is  normal.   4. Left atrial size was mildly dilated.   5. The mitral valve is grossly normal. Mild mitral valve regurgitation.   6. The aortic valve is tricuspid. There is mild calcification of the  aortic valve. There is mild thickening of the aortic valve. Aortic valve  regurgitation is mild. Aortic valve sclerosis/calcification is present,  without any evidence of aortic  stenosis.   7. Aortic dilatation noted. There is mild dilatation of the aortic root,  measuring 40 mm. There is mild dilatation of the ascending aorta,  measuring 37 mm.   Comparison(s): No prior Echocardiogram.      Physical Exam VS:  BP 112/60   Pulse 98   Ht 5' 11 (1.803 m)   Wt 216 lb 12.8 oz (98.3 kg)   SpO2 93%   BMI 30.24 kg/m        Wt Readings from Last 3 Encounters:  05/18/24 216 lb 12.8 oz (98.3 kg)  03/30/24 217 lb 12.8 oz (98.8 kg)  08/30/23 224 lb 1.6 oz (101.7 kg)    GEN: Well nourished, well developed in no acute distress NECK: No JVD; No carotid bruits CARDIAC: RRR, no murmurs, rubs, gallops RESPIRATORY:  Clear to auscultation without rales, wheezing or rhonchi  ABDOMEN: Soft, non-tender, non-distended EXTREMITIES:  No edema; No deformity   ASSESSMENT AND PLAN  Chronic systolic heart failure due to dilated cardiomyopathy Chronic systolic heart failure with reduced ejection fraction (35-40%) due to dilated cardiomyopathy.  Current management includes Entresto  and Jardiance . - Continue Entresto  for heart failure management. - Continue Jardiance  for heart failure and kidney protection.  Atherosclerotic heart disease of native coronary arteries without angina Mild atherosclerotic heart disease identified on CT scan in 2023, with non-obstructive disease in the proximal LAD, OM1, and right coronary arteries. Previous chest pain episodes were likely due to esophageal spasms rather than cardiac origin.  Mixed hyperlipidemia Elevated LDL cholesterol at 130 mg/dL, above the target of 70 mg/dL  for individuals with coronary artery disease. Previous intolerance to rosuvastatin due to muscle cramps. Discussed non-statin option, Nexlizet , which has a lower incidence of muscle-related side effects. - Start Nexlizet  for cholesterol management. - Recheck lipid panel in 3 months to assess LDL levels.  Pulmonary embolism and deep vein thrombosis Unprovoked pulmonary embolism and deep vein thrombosis in 2020, possibly related to undiagnosed COVID-19. Currently on long-term anticoagulation with Eliquis  to prevent recurrence, with a noted 20-25% risk of recurrence over five years. - Continue Eliquis  for anticoagulation.  RBBB - Chronic and stable     Dispo: He can follow-up in a year with Dr. Wendel  Signed, Orren LOISE Fabry, PA-C

## 2024-05-18 ENCOUNTER — Other Ambulatory Visit: Payer: Self-pay

## 2024-05-18 ENCOUNTER — Ambulatory Visit: Attending: Physician Assistant | Admitting: Physician Assistant

## 2024-05-18 ENCOUNTER — Telehealth: Payer: Self-pay | Admitting: Pharmacy Technician

## 2024-05-18 ENCOUNTER — Encounter: Payer: Self-pay | Admitting: Physician Assistant

## 2024-05-18 VITALS — BP 112/60 | HR 98 | Ht 71.0 in | Wt 216.8 lb

## 2024-05-18 DIAGNOSIS — E782 Mixed hyperlipidemia: Secondary | ICD-10-CM | POA: Diagnosis not present

## 2024-05-18 DIAGNOSIS — I8290 Acute embolism and thrombosis of unspecified vein: Secondary | ICD-10-CM | POA: Diagnosis not present

## 2024-05-18 DIAGNOSIS — I10 Essential (primary) hypertension: Secondary | ICD-10-CM | POA: Diagnosis not present

## 2024-05-18 DIAGNOSIS — I700 Atherosclerosis of aorta: Secondary | ICD-10-CM | POA: Diagnosis not present

## 2024-05-18 DIAGNOSIS — I420 Dilated cardiomyopathy: Secondary | ICD-10-CM | POA: Diagnosis not present

## 2024-05-18 DIAGNOSIS — I2510 Atherosclerotic heart disease of native coronary artery without angina pectoris: Secondary | ICD-10-CM

## 2024-05-18 DIAGNOSIS — Z7901 Long term (current) use of anticoagulants: Secondary | ICD-10-CM

## 2024-05-18 DIAGNOSIS — I5022 Chronic systolic (congestive) heart failure: Secondary | ICD-10-CM

## 2024-05-18 MED ORDER — NEXLIZET 180-10 MG PO TABS: 1.0000 | ORAL_TABLET | Freq: Every day | ORAL | 3 refills | Status: DC

## 2024-05-18 NOTE — Telephone Encounter (Signed)
 Pharmacy Patient Advocate Encounter  Received notification from [employer] that Prior Authorization for nexlizet  has been APPROVED from 05/18/24 to 08/23/24   PA #/Case ID/Reference #: [id number]

## 2024-05-18 NOTE — Patient Instructions (Addendum)
 medication Instructions:  Start Nextlizet 180-10 MG take one tablet daily *If you need a refill on your cardiac medications before your next appointment, please call your pharmacy*  Lab Work: Lipids in three months (07/2024) If you have labs (blood work) drawn today and your tests are completely normal, you will receive your results only by: MyChart Message (if you have MyChart) OR A paper copy in the mail If you have any lab test that is abnormal or we need to change your treatment, we will call you to review the results.   Follow-Up: At [facility], you and your health needs are our priority.  As part of our continuing mission to provide you with exceptional heart care, our providers are all part of one team.  This team includes your primary Cardiologist (physician) and Advanced Practice Providers or APPs (Physician Assistants and Nurse Practitioners) who all work together to provide you with the care you need, when you need it.  Your next appointment:   1 year(s)  Provider:   Arun K Thukkani, MD

## 2024-05-18 NOTE — Telephone Encounter (Signed)
   Pharmacy Patient Advocate Encounter   Received notification from CoverMyMeds that prior authorization for nexlizet  is required/requested.   Insurance verification completed.   The patient is insured through [place] .   Per test claim: PA required; PA submitted to above mentioned insurance via Latent Key/confirmation #/EOC [id number] Status is pending

## 2024-05-27 ENCOUNTER — Other Ambulatory Visit: Payer: Self-pay | Admitting: Internal Medicine

## 2024-05-28 ENCOUNTER — Other Ambulatory Visit: Payer: Self-pay | Admitting: Nurse Practitioner

## 2024-06-09 ENCOUNTER — Other Ambulatory Visit: Payer: Self-pay | Admitting: Internal Medicine

## 2024-06-09 DIAGNOSIS — N481 Balanitis: Secondary | ICD-10-CM

## 2024-06-09 MED ORDER — CLOTRIMAZOLE 1 % EX CREA: 1.0000 | TOPICAL_CREAM | Freq: Two times a day (BID) | CUTANEOUS | 0 refills | Status: AC

## 2024-06-13 DIAGNOSIS — I2510 Atherosclerotic heart disease of native coronary artery without angina pectoris: Secondary | ICD-10-CM | POA: Diagnosis not present

## 2024-06-13 DIAGNOSIS — I130 Hypertensive heart and chronic kidney disease with heart failure and stage 1 through stage 4 chronic kidney disease, or unspecified chronic kidney disease: Secondary | ICD-10-CM | POA: Diagnosis not present

## 2024-06-13 DIAGNOSIS — E785 Hyperlipidemia, unspecified: Secondary | ICD-10-CM | POA: Diagnosis not present

## 2024-06-13 DIAGNOSIS — I429 Cardiomyopathy, unspecified: Secondary | ICD-10-CM | POA: Diagnosis not present

## 2024-06-13 DIAGNOSIS — I509 Heart failure, unspecified: Secondary | ICD-10-CM | POA: Diagnosis not present

## 2024-06-13 DIAGNOSIS — J8410 Pulmonary fibrosis, unspecified: Secondary | ICD-10-CM | POA: Diagnosis not present

## 2024-06-13 DIAGNOSIS — J479 Bronchiectasis, uncomplicated: Secondary | ICD-10-CM | POA: Diagnosis not present

## 2024-06-13 DIAGNOSIS — K219 Gastro-esophageal reflux disease without esophagitis: Secondary | ICD-10-CM | POA: Diagnosis not present

## 2024-06-13 DIAGNOSIS — I4891 Unspecified atrial fibrillation: Secondary | ICD-10-CM | POA: Diagnosis not present

## 2024-07-17 ENCOUNTER — Other Ambulatory Visit: Payer: Self-pay

## 2024-07-18 MED ORDER — NEXLIZET 180-10 MG PO TABS: 1.0000 | ORAL_TABLET | Freq: Every day | ORAL | 3 refills | Status: AC

## 2024-07-25 ENCOUNTER — Other Ambulatory Visit: Payer: Self-pay | Admitting: Nurse Practitioner

## 2024-08-01 ENCOUNTER — Other Ambulatory Visit (HOSPITAL_COMMUNITY): Payer: Self-pay

## 2024-08-01 ENCOUNTER — Telehealth: Payer: Self-pay

## 2024-08-01 NOTE — Telephone Encounter (Signed)
 Pharmacy Patient Advocate Encounter   Received notification from CoverMyMeds that prior authorization for NEXLIZET  is required/requested.   Insurance verification completed.   The patient is insured through [employer].   Per test claim: PA required; PA submitted to above mentioned insurance via Latent Key/confirmation #/EOC [id number] Status is pending

## 2024-08-02 NOTE — Telephone Encounter (Signed)
 Pharmacy Patient Advocate Encounter  Received notification from [employer] that Prior Authorization for NEXLIZET  has been APPROVED from 08/25/23 to 08/23/25

## 2024-08-29 ENCOUNTER — Other Ambulatory Visit: Payer: Self-pay | Admitting: Nurse Practitioner
